# Patient Record
Sex: Male | Born: 1970 | Race: White | Hispanic: No | Marital: Married | Smoking: Former smoker
Health system: Southern US, Community
[De-identification: ages and names within clinical notes are randomized; demographics above are authoritative.]

## PROBLEM LIST (undated history)

## (undated) DIAGNOSIS — K5792 Diverticulitis of intestine, part unspecified, without perforation or abscess without bleeding: Secondary | ICD-10-CM

## (undated) DIAGNOSIS — K625 Hemorrhage of anus and rectum: Secondary | ICD-10-CM

## (undated) DIAGNOSIS — K589 Irritable bowel syndrome without diarrhea: Secondary | ICD-10-CM

## (undated) DIAGNOSIS — J45909 Unspecified asthma, uncomplicated: Secondary | ICD-10-CM

## (undated) DIAGNOSIS — Z973 Presence of spectacles and contact lenses: Secondary | ICD-10-CM

## (undated) HISTORY — PX: COLONOSCOPY: SHX174

## (undated) HISTORY — PX: OTHER SURGICAL HISTORY: SHX169

## (undated) HISTORY — DX: Diverticulitis of intestine, part unspecified, without perforation or abscess without bleeding: K57.92

## (undated) HISTORY — DX: Unspecified asthma, uncomplicated: J45.909

---

## 1991-07-07 HISTORY — PX: KNEE SURGERY: SHX244

## 1997-07-06 HISTORY — PX: APPENDECTOMY: SHX54

## 2013-02-27 ENCOUNTER — Telehealth (INDEPENDENT_AMBULATORY_CARE_PROVIDER_SITE_OTHER): Payer: Self-pay | Admitting: General Surgery

## 2013-02-28 ENCOUNTER — Ambulatory Visit (INDEPENDENT_AMBULATORY_CARE_PROVIDER_SITE_OTHER): Payer: Self-pay | Admitting: General Surgery

## 2013-03-03 ENCOUNTER — Ambulatory Visit (INDEPENDENT_AMBULATORY_CARE_PROVIDER_SITE_OTHER): Payer: 59 | Admitting: General Surgery

## 2013-03-03 ENCOUNTER — Encounter (INDEPENDENT_AMBULATORY_CARE_PROVIDER_SITE_OTHER): Payer: Self-pay | Admitting: General Surgery

## 2013-03-03 VITALS — BP 122/86 | HR 66 | Temp 97.1°F | Resp 16 | Ht 66.0 in | Wt 224.2 lb

## 2013-03-03 DIAGNOSIS — K611 Rectal abscess: Secondary | ICD-10-CM

## 2013-03-03 DIAGNOSIS — K612 Anorectal abscess: Secondary | ICD-10-CM

## 2013-03-03 MED ORDER — LEVOFLOXACIN 750 MG PO TABS: 750.0000 mg | ORAL_TABLET | Freq: Every day | ORAL | Status: AC

## 2013-03-03 NOTE — Progress Notes (Signed)
Patient ID: Jacob Graham, male   DOB: May 19, 1971, 42 y.o.   MRN: [id number]  Chief Complaint  Patient presents with  . New Evaluation    eval peri rectal abs  . Rectal Problems    HPI Jacob Graham is a 42 y.o. male.  This patient is referred by Dr. Maryan Rued for evaluation of a perirectal abscess. He said that since Christmas he has had a "fissure" which he self treated with verbal suppositories.  He has had bright red blood per rectum and pain with bowel movements but this improved with his herbal suppositories. This then was followed up with hemorrhoids and some thin ribbon like stools and he was referred for colonoscopy in May and was essentially normal except for a polyp. Since then he has been taking Metamucil daily and he moves his bowels frequently about 3 times per day.  Since starting the fiber is symptoms improved but about 2 weeks ago he stopped his fiber on vacation and this caused some straining with bowel movements and after that he felt a "knot" in the rectal area of. He has been having some purulent drainage initially but now the drainage is more clear. He has minimal discomfort although it did hurt more initially and he denies any fevers or chills. HPI  Past Medical History  Diagnosis Date  . Asthma     Past Surgical History  Procedure Laterality Date  . Appendectomy  1999  . Knee surgery  1993  . Colonoscopy  2008, 2014    Family History  Problem Relation Age of Onset  . Cancer Paternal Grandfather     Colon    Social History History  Substance Use Topics  . Smoking status: Former Smoker    Quit date: 03/03/2005  . Smokeless tobacco: Not on file  . Alcohol Use: Yes     Comment: 2x month    No Known Allergies  Current Outpatient Prescriptions  Medication Sig Dispense Refill  . psyllium (METAMUCIL) 58.6 % powder Take 1 packet by mouth Nightly.      Marland Kitchen Specialty Vitamins Products (MAGNESIUM, AMINO ACID CHELATE,) 133 MG tablet Take 1 tablet by mouth 2  (two) times daily.       No current facility-administered medications for this visit.    Review of Systems Review of Systems All other review of systems negative or noncontributory except as stated in the HPI  Blood pressure 122/86, pulse 66, temperature 97.1 F (36.2 C), temperature source Temporal, resp. rate 16, height 5\' 6"  (1.676 m), weight 224 lb 3.2 oz (101.696 kg), SpO2 98.00%.  Physical Exam Physical Exam Physical Exam  Vitals reviewed. Constitutional: He is oriented to person, place, and time. He appears well-developed and well-nourished. No distress.  HENT:  Head: Normocephalic and atraumatic.  Mouth/Throat: No oropharyngeal exudate.  Eyes: Conjunctivae and EOM are normal. Pupils are equal, round, and reactive to light. Right eye exhibits no discharge. Left eye exhibits no discharge. No scleral icterus.  Neck: Normal range of motion. No tracheal deviation present.  Cardiovascular: Normal rate, regular rhythm and normal heart sounds.   Pulmonary/Chest: Effort normal and breath sounds normal. No stridor. No respiratory distress. He has no wheezes. He has no rales. He exhibits no tenderness.  Abdominal: Soft. Bowel sounds are normal. He exhibits no distension and no mass. There is no tenderness. There is no rebound and no guarding.  Rectal:  He has minimal tenderness to digital rectal exam. 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 He does have a palpable "knot" immediately posteriorly but no significant tenderness or evidence of fistula. He also has a hemorrhoid at the left lateral column. Anoscopy was performed which did allow me to see if an internal opening with a small amount of purulent drainage in the posterior position in the area associated with the palpable induration externally. Musculoskeletal: Normal range of motion. He exhibits no edema and no tenderness.  Neurological: He is alert and oriented to person, place, and time.   Skin: Skin is warm and dry. No rash noted. He is not diaphoretic. No erythema. No pallor.  Psychiatric: He has a normal mood and affect. His behavior is normal. Judgment and thought content normal.    Data Reviewed   Assessment    Perirectal abscess I think his symptoms are most likely due to perirectal abscess. Fortunately, I think that this is already draining internally and hopefully this will be adequate drainage and we will not be need to do any formal incision and drainage. He still has some induration in the area although he says that this is much improved from prior. I will add some antibiotic therapy for the next 10 days and I will see him back in about 2 weeks. If his symptoms are improved then we will continue with watchful waiting otherwise I would likely recommend setting him up for formal rectal exam under anesthesia with possible internal drainage. I did recommend that he continue with his high-fiber diet and drink any fluids and again, I will see him back in in 2 weeks    Plan    Levaquin 750 mg daily Continue with high-fiber diet and I will see him back in 2 weeks for repeat exam         Laquisha Northcraft DAVID 03/03/2013, 10:50 AM

## 2013-03-07 NOTE — Telephone Encounter (Signed)
See message.

## 2013-03-17 ENCOUNTER — Ambulatory Visit (INDEPENDENT_AMBULATORY_CARE_PROVIDER_SITE_OTHER): Payer: 59 | Admitting: General Surgery

## 2013-03-17 ENCOUNTER — Encounter (INDEPENDENT_AMBULATORY_CARE_PROVIDER_SITE_OTHER): Payer: Self-pay | Admitting: General Surgery

## 2013-03-17 VITALS — BP 118/78 | HR 68 | Temp 97.2°F | Resp 16 | Ht 66.0 in | Wt 229.2 lb

## 2013-03-17 DIAGNOSIS — K602 Anal fissure, unspecified: Secondary | ICD-10-CM

## 2013-03-17 DIAGNOSIS — K603 Anal fistula: Secondary | ICD-10-CM

## 2013-03-17 NOTE — Progress Notes (Signed)
Subjective:     Patient ID: Jacob Graham, male   DOB: 1971/04/27, 42 y.o.   MRN: [id number]  HPI This patient follows up for evaluation of anal fissure and possible anal fistula. Last time I saw him we started him on some antibiotics and he said his symptoms had improved until about 5 days ago when he began having some swelling in the area and some discomfort and he had some spontaneous drainage from his rectum. He denies any pain with bowel movements or blood in the stools  Review of Systems     Objective:   Physical Exam On exam he has 8 left lateral column hemorrhoid without evidence of bleeding or thrombosis. Immediately in the posterior midline he has what appears to be an anal fissure without evidence of bleeding and is nontender. 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 He may have an associated small internal abscess or fistula but I do not see any external opening for this fistula if present. I also asked Dr. Ezzard Standing to examine him and we feel that at this point there is no obvious fistula or abscess and we feel that it would probably be best to does give [place] a bit more time to see if his symptoms improve spontaneously since he really isn't having much symptoms. We also discussed rectal exam under anesthesia to more clearly identify the cause but we will hold on this unless his symptoms do not completely resolve.     Plan:      If he continues to have issues then we will plan for rectal EUA with possible I/D or fistulotomy and anal fissurectomy

## 2015-03-25 ENCOUNTER — Emergency Department (HOSPITAL_BASED_OUTPATIENT_CLINIC_OR_DEPARTMENT_OTHER)
Admission: EM | Admit: 2015-03-25 | Discharge: 2015-03-25 | Disposition: A | Discharge status: Home / Self Care | Payer: Managed Care, Other (non HMO) | Attending: Emergency Medicine | Admitting: Emergency Medicine

## 2015-03-25 ENCOUNTER — Encounter (HOSPITAL_BASED_OUTPATIENT_CLINIC_OR_DEPARTMENT_OTHER): Payer: Self-pay | Admitting: *Deleted

## 2015-03-25 DIAGNOSIS — K589 Irritable bowel syndrome without diarrhea: Secondary | ICD-10-CM | POA: Insufficient documentation

## 2015-03-25 DIAGNOSIS — Z79899 Other long term (current) drug therapy: Secondary | ICD-10-CM | POA: Insufficient documentation

## 2015-03-25 DIAGNOSIS — R197 Diarrhea, unspecified: Secondary | ICD-10-CM | POA: Diagnosis not present

## 2015-03-25 DIAGNOSIS — J45909 Unspecified asthma, uncomplicated: Secondary | ICD-10-CM | POA: Insufficient documentation

## 2015-03-25 DIAGNOSIS — R109 Unspecified abdominal pain: Secondary | ICD-10-CM | POA: Diagnosis not present

## 2015-03-25 DIAGNOSIS — R110 Nausea: Secondary | ICD-10-CM | POA: Diagnosis not present

## 2015-03-25 DIAGNOSIS — M549 Dorsalgia, unspecified: Secondary | ICD-10-CM | POA: Insufficient documentation

## 2015-03-25 DIAGNOSIS — Z87891 Personal history of nicotine dependence: Secondary | ICD-10-CM | POA: Insufficient documentation

## 2015-03-25 DIAGNOSIS — Z9089 Acquired absence of other organs: Secondary | ICD-10-CM | POA: Insufficient documentation

## 2015-03-25 HISTORY — DX: Irritable bowel syndrome, unspecified: K58.9

## 2015-03-25 LAB — URINALYSIS, ROUTINE W REFLEX MICROSCOPIC
Bilirubin Urine: NEGATIVE
Glucose, UA: NEGATIVE mg/dL
Hgb urine dipstick: NEGATIVE
Ketones, ur: NEGATIVE mg/dL
Leukocytes, UA: NEGATIVE
Nitrite: NEGATIVE
Protein, ur: NEGATIVE mg/dL
Specific Gravity, Urine: 1.006 (ref 1.005–1.030)
Urobilinogen, UA: 0.2 mg/dL (ref 0.0–1.0)
pH: 6 (ref 5.0–8.0)

## 2015-03-25 NOTE — ED Notes (Signed)
Pt c/o left flank pain radiating around to his left lower abd x5 days. Pt also c/o nausea and diarrhea.

## 2015-03-25 NOTE — Discharge Instructions (Signed)
Flank Pain °Flank pain refers to pain that is located on the side of the body between the upper abdomen and the back. The pain may occur over a short period of time (acute) or may be long-term or reoccurring (chronic). It may be mild or severe. Flank pain can be caused by many things. °CAUSES  °Some of the more common causes of flank pain include: °· Muscle strains.   °· Muscle spasms.   °· A disease of your spine (vertebral disk disease).   °· A lung infection (pneumonia).   °· Fluid around your lungs (pulmonary edema).   °· A kidney infection.   °· Kidney stones.   °· A very painful skin rash caused by the chickenpox virus (shingles).   °· Gallbladder disease.   °HOME CARE INSTRUCTIONS  °Home care will depend on the cause of your pain. In general, °· Rest as directed by your caregiver. °· Drink enough fluids to keep your urine clear or pale yellow. °· Only take over-the-counter or prescription medicines as directed by your caregiver. Some medicines may help relieve the pain. °· Tell your caregiver about any changes in your pain. °· Follow up with your caregiver as directed. °SEEK IMMEDIATE MEDICAL CARE IF:  °· Your pain is not controlled with medicine.   °· You have new or worsening symptoms. °· Your pain increases.   °· You have abdominal pain.   °· You have shortness of breath.   °· You have persistent nausea or vomiting.   °· You have swelling in your abdomen.   °· You feel faint or pass out.   °· You have blood in your urine. °· You have a fever or persistent symptoms for more than 2-3 days. °· You have a fever and your symptoms suddenly get worse. °MAKE SURE YOU:  °· Understand these instructions. °· Will watch your condition. °· Will get help right away if you are not doing well or get worse. °Document Released: 08/13/2005 Document Revised: 03/16/2012 Document Reviewed: 02/04/2012 °Exit[employer]® Patient Information ©2015 [employer]. This information is not intended to replace advice given to you by your  health care provider. Make sure you discuss any questions you have with your health care provider. ° °

## 2015-03-25 NOTE — ED Provider Notes (Signed)
CSN: [id number]     Arrival date & time 03/25/15  1003 History   First MD Initiated Contact with Patient 03/25/15 1020     Chief Complaint  Patient presents with  . Flank Pain     (Consider location/radiation/quality/duration/timing/severity/associated sxs/prior Treatment) Patient is a 44 y.o. male presenting with flank pain. The history is provided by the patient.  Flank Pain This is a new problem. Pertinent negatives include no chest pain, no abdominal pain and no shortness of breath.   patient has had some pain in his left flank for the last few days. Worse with certain movements. Worse with lying down. No dysuria. He has had some diarrhea and nausea. States he has had some rumbling in his abdomen also. States he was talking his wife and they're worried about infection or kidney stone. The pain does come and go somewhat. No fevers or chills. States the pain can get somewhat severe time but also comes and goes. No history of kidney stones. No dysuria or hematuria.   Past Medical History  Diagnosis Date  . Asthma   . IBS (irritable bowel syndrome)    Past Surgical History  Procedure Laterality Date  . Appendectomy  1999  . Knee surgery  1993  . Colonoscopy  2008, 2014   Family History  Problem Relation Age of Onset  . Cancer Paternal Grandfather     Colon   Social History  Substance Use Topics  . Smoking status: Former Smoker    Quit date: 03/03/2005  . Smokeless tobacco: None  . Alcohol Use: Yes     Comment: 2x month    Review of Systems  Constitutional: Negative for appetite change.  Respiratory: Negative for shortness of breath.   Cardiovascular: Negative for chest pain.  Gastrointestinal: Positive for nausea and diarrhea. Negative for abdominal pain.  Genitourinary: Positive for flank pain. Negative for hematuria.  Musculoskeletal: Positive for back pain.      Allergies  Review of patient's allergies indicates no known allergies.  Home Medications   Prior  to Admission medications   Medication Sig Start Date End Date Taking? Authorizing Provider  psyllium (METAMUCIL) 58.6 % powder Take 1 packet by mouth Nightly.    Historical Provider, MD  Specialty Vitamins Products (MAGNESIUM, AMINO ACID CHELATE,) 133 MG tablet Take 1 tablet by mouth 2 (two) times daily.    Historical Provider, MD   BP 142/89 mmHg  Pulse 72  Temp(Src) 98 F (36.7 C) (Oral)  Resp 16  Ht  (1.676 m)  Wt 230 lb (104.327 kg)  BMI 37.14 kg/m2  SpO2 100% Physical Exam  Constitutional: He appears well-developed and well-nourished.  Neck: Neck supple.  Cardiovascular: Normal rate.   Pulmonary/Chest: Effort normal.  Abdominal: There is no tenderness.  Musculoskeletal:  Mild tenderness and left CVA area. No rash.  Neurological: He is alert.  Skin: Skin is warm.    ED Course  Procedures (including critical care time) Labs Review Labs Reviewed  URINALYSIS, ROUTINE W REFLEX MICROSCOPIC (NOT AT [facility])    Imaging Review No results found. I have personally reviewed and evaluated these images and lab results as part of my medical decision-making.   EKG Interpretation None      MDM   Final diagnoses:  Flank pain   patient with left flank pain. Likely musculoskeletal. Clean urine. Overall rather benign exam. Will discharge home.    Benjiman Core, MD 03/25/15 1504

## 2015-09-24 ENCOUNTER — Other Ambulatory Visit: Payer: Self-pay | Admitting: General Surgery

## 2015-09-24 NOTE — H&P (Signed)
  History of Present Illness Jacob Graham(Jacob Manuelito MD; 09/24/2015 11:56 AM) The patient is a 45 year old male who presents with a complaint of anal problems. 45yo M with h/o anal fissure in 2014 who presents to the office with a new area of inflammation at posterior midline that has turned into a "knot" that sometimes swells up, becomes painful and then drains. This occurs every few weeks. He denies any bleeding. He takes metamucil daily and is regular with this. Recently he has had more drainage from his fistula site then normal. He has also had several episodes of bloody bowel movements. Patient does have a history of rectal bleeding and has had colonoscopies in the past which have been normal.   Problem List/Past Medical Jacob Graham(Jacob Courser, MD; 09/24/2015 11:56 AM) ANAL FISTULA (K60.3)  Other Problems Jacob Graham(Jacob Tetreault, MD; 09/24/2015 11:56 AM) Other disease, cancer, significant illness Arthritis Asthma  Past Surgical History Jacob Graham(Jacob Bedoy, MD; 09/24/2015 11:56 AM) Colon Polyp Removal - Colonoscopy Appendectomy  Diagnostic Studies History Jacob Graham(Jacob Finstad, MD; 09/24/2015 11:56 AM) Colonoscopy 1-5 years ago  Allergies Jacob Graham(Jacob Graham, CMA; 09/24/2015 11:12 AM) No Known Drug Allergies 07/25/2015  Medication History Jacob Graham(Jacob Fiumara, MD; 09/24/2015 11:56 AM) Metamucil (58.6% Powder, Oral) Active. Medications Reconciled ValACYclovir HCl (1GM Tablet, Oral) Active.  Social History Jacob Graham(Jacob Kratky, MD; 09/24/2015 11:56 AM) Alcohol use Occasional alcohol use. Tobacco use Former smoker. Caffeine use Coffee, Tea. No drug use  Family History Jacob Graham(Jacob Asbury, MD; 09/24/2015 11:56 AM) Alcohol Abuse Father, Mother. Thyroid problems Mother. Seizure disorder Mother. Hypertension Mother.    Vitals Jacob Graham(Jacob Graham CMA; 09/24/2015 11:13 AM) 09/24/2015 11:13 AM Weight: 237 lb Height: 66in Body Surface Area: 2.15 m Body Mass Index: 38.25 kg/m  Temp.: 97.32F  Pulse: 80 (Regular)  BP:  130/68 (Sitting, Left Arm, Standard)      Physical Exam Jacob Graham(Judith Campillo MD; 09/24/2015 11:56 AM)  General Mental Status-Alert. General Appearance-Not in acute distress. Build & Nutrition-Well nourished. Posture-Normal posture. Gait-Normal.  Head and Neck Head-normocephalic, atraumatic with no lesions or palpable masses. Trachea-midline.  Chest and Lung Exam Chest and lung exam reveals -on auscultation, normal breath sounds, no adventitious sounds and normal vocal resonance.  Cardiovascular Cardiovascular examination reveals -normal heart sounds, regular rate and rhythm with no murmurs.  Abdomen Inspection Inspection of the abdomen reveals - No Hernias. Palpation/Percussion Palpation and Percussion of the abdomen reveal - Soft, Non Tender, No Rigidity (guarding), No hepatosplenomegaly and No Palpable abdominal masses.  Rectal Anorectal Exam External - Note: posterior midline external opening.  Neurologic Neurologic evaluation reveals -alert and oriented x 3 with no impairment of recent or remote memory, normal attention span and ability to concentrate, normal sensation and normal coordination.  Musculoskeletal Normal Exam - Bilateral-Upper Extremity Strength Normal and Lower Extremity Strength Normal.    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 He is now having some rectal bleeding as well. He has noticed a change in the caliber of his stool. I have recommended an exam under anesthesia with fistulotomy versus seton placement. If an internal hemorrhoid appears to be the cause of his bleeding, we will plan on doing a hemorrhoidal pexy at the same time. Risks include bleeding, infection and a small chance of incontinence if fistulotomy is performed.

## 2015-10-08 ENCOUNTER — Encounter (HOSPITAL_BASED_OUTPATIENT_CLINIC_OR_DEPARTMENT_OTHER): Payer: Self-pay | Admitting: *Deleted

## 2015-10-08 NOTE — Progress Notes (Signed)
Pt instructed npo pmn tonight.  [facility]  Needs hgb on arrival.  Pt to use inhaler and bring it with him.

## 2015-10-09 ENCOUNTER — Encounter (HOSPITAL_BASED_OUTPATIENT_CLINIC_OR_DEPARTMENT_OTHER): Payer: Self-pay | Admitting: *Deleted

## 2015-10-09 ENCOUNTER — Encounter (HOSPITAL_BASED_OUTPATIENT_CLINIC_OR_DEPARTMENT_OTHER)
Admission: RE | Disposition: A | Discharge status: Home / Self Care | Payer: Self-pay | Source: Ambulatory Visit | Attending: General Surgery

## 2015-10-09 ENCOUNTER — Ambulatory Visit (HOSPITAL_BASED_OUTPATIENT_CLINIC_OR_DEPARTMENT_OTHER): Payer: Managed Care, Other (non HMO) | Admitting: Anesthesiology

## 2015-10-09 ENCOUNTER — Ambulatory Visit (HOSPITAL_BASED_OUTPATIENT_CLINIC_OR_DEPARTMENT_OTHER)
Admission: RE | Admit: 2015-10-09 | Discharge: 2015-10-09 | Disposition: A | Discharge status: Home / Self Care | Payer: Managed Care, Other (non HMO) | Source: Ambulatory Visit | Attending: General Surgery | Admitting: General Surgery

## 2015-10-09 DIAGNOSIS — K648 Other hemorrhoids: Secondary | ICD-10-CM | POA: Insufficient documentation

## 2015-10-09 DIAGNOSIS — Z79899 Other long term (current) drug therapy: Secondary | ICD-10-CM | POA: Diagnosis not present

## 2015-10-09 DIAGNOSIS — Z87891 Personal history of nicotine dependence: Secondary | ICD-10-CM | POA: Diagnosis not present

## 2015-10-09 DIAGNOSIS — K603 Anal fistula: Secondary | ICD-10-CM | POA: Insufficient documentation

## 2015-10-09 HISTORY — DX: Presence of spectacles and contact lenses: Z97.3

## 2015-10-09 HISTORY — DX: Hemorrhage of anus and rectum: K62.5

## 2015-10-09 HISTORY — PX: EVALUATION UNDER ANESTHESIA WITH FISTULECTOMY: SHX5623

## 2015-10-09 SURGERY — EXAM UNDER ANESTHESIA WITH FISTULECTOMY
Anesthesia: Monitor Anesthesia Care

## 2015-10-09 MED ORDER — ONDANSETRON HCL 4 MG/2ML IJ SOLN
4.0000 mg | Freq: Once | INTRAMUSCULAR | Status: AC | PRN
  Administered 2015-10-09: 4 mg via INTRAVENOUS
  Filled 2015-10-09: qty 2

## 2015-10-09 MED ORDER — KETOROLAC TROMETHAMINE 30 MG/ML IJ SOLN
INTRAMUSCULAR | Status: DC | PRN
  Administered 2015-10-09: 30 mg via INTRAVENOUS

## 2015-10-09 MED ORDER — ACETAMINOPHEN 650 MG RE SUPP
650.0000 mg | RECTAL | Status: DC | PRN
  Filled 2015-10-09: qty 1

## 2015-10-09 MED ORDER — HYDROMORPHONE HCL 1 MG/ML IJ SOLN
0.5000 mg | INTRAMUSCULAR | Status: DC | PRN
  Filled 2015-10-09: qty 1

## 2015-10-09 MED ORDER — FENTANYL CITRATE (PF) 100 MCG/2ML IJ SOLN
INTRAMUSCULAR | Status: DC | PRN
  Administered 2015-10-09 (×8): 12.5 ug via INTRAVENOUS

## 2015-10-09 MED ORDER — OXYCODONE HCL 5 MG PO TABS: 5.0000 mg | ORAL_TABLET | ORAL | Status: DC | PRN

## 2015-10-09 MED ORDER — BUPIVACAINE-EPINEPHRINE 0.5% -1:200000 IJ SOLN
INTRAMUSCULAR | Status: DC | PRN
  Administered 2015-10-09: 10 mL
  Administered 2015-10-09: 7 mL

## 2015-10-09 MED ORDER — ACETAMINOPHEN 325 MG PO TABS
650.0000 mg | ORAL_TABLET | ORAL | Status: DC | PRN
  Filled 2015-10-09: qty 2

## 2015-10-09 MED ORDER — SODIUM CHLORIDE 0.9 % IV SOLN
250.0000 mL | INTRAVENOUS | Status: DC | PRN
  Filled 2015-10-09: qty 250

## 2015-10-09 MED ORDER — LIDOCAINE 5 % EX OINT
TOPICAL_OINTMENT | CUTANEOUS | Status: DC | PRN
  Administered 2015-10-09: 1

## 2015-10-09 MED ORDER — OXYCODONE HCL 5 MG PO TABS
5.0000 mg | ORAL_TABLET | ORAL | Status: DC | PRN
Start: 2015-10-09 — End: 2015-10-09
  Filled 2015-10-09: qty 2

## 2015-10-09 MED ORDER — LIDOCAINE HCL (CARDIAC) 20 MG/ML IV SOLN
INTRAVENOUS | Status: DC | PRN
  Administered 2015-10-09: 100 mg via INTRAVENOUS

## 2015-10-09 MED ORDER — DEXAMETHASONE SODIUM PHOSPHATE 4 MG/ML IJ SOLN
INTRAMUSCULAR | Status: DC | PRN
  Administered 2015-10-09: 10 mg via INTRAVENOUS

## 2015-10-09 MED ORDER — PROPOFOL 500 MG/50ML IV EMUL
INTRAVENOUS | Status: AC
  Filled 2015-10-09: qty 50

## 2015-10-09 MED ORDER — LIDOCAINE HCL (CARDIAC) 20 MG/ML IV SOLN
INTRAVENOUS | Status: AC
  Filled 2015-10-09: qty 5

## 2015-10-09 MED ORDER — LACTATED RINGERS IV SOLN
INTRAVENOUS | Status: DC
Start: 2015-10-09 — End: 2015-10-09
  Administered 2015-10-09: 10:00:00 via INTRAVENOUS
  Filled 2015-10-09: qty 1000

## 2015-10-09 MED ORDER — PROPOFOL 500 MG/50ML IV EMUL
INTRAVENOUS | Status: DC | PRN
  Administered 2015-10-09: 25 ug/kg/min via INTRAVENOUS

## 2015-10-09 MED ORDER — MIDAZOLAM HCL 5 MG/5ML IJ SOLN
INTRAMUSCULAR | Status: DC | PRN
  Administered 2015-10-09: 2 mg via INTRAVENOUS

## 2015-10-09 MED ORDER — SODIUM CHLORIDE 0.9% FLUSH
3.0000 mL | INTRAVENOUS | Status: DC | PRN
  Filled 2015-10-09: qty 3

## 2015-10-09 MED ORDER — FENTANYL CITRATE (PF) 100 MCG/2ML IJ SOLN
INTRAMUSCULAR | Status: AC
  Filled 2015-10-09: qty 2

## 2015-10-09 MED ORDER — SODIUM CHLORIDE 0.9% FLUSH
3.0000 mL | Freq: Two times a day (BID) | INTRAVENOUS | Status: DC
  Filled 2015-10-09: qty 3

## 2015-10-09 MED ORDER — MIDAZOLAM HCL 2 MG/2ML IJ SOLN
INTRAMUSCULAR | Status: AC
  Filled 2015-10-09: qty 2

## 2015-10-09 MED ORDER — SODIUM CHLORIDE 0.9 % IR SOLN
Status: DC | PRN
  Administered 2015-10-09: 500 mL

## 2015-10-09 SURGICAL SUPPLY — 40 items
BENZOIN TINCTURE PRP APPL 2/3 (GAUZE/BANDAGES/DRESSINGS) ×2 IMPLANT
BLADE SURG 10 STRL SS (BLADE) IMPLANT
BRIEF STRETCH FOR OB PAD LRG (UNDERPADS AND DIAPERS) ×2 IMPLANT
COVER BACK TABLE 60X90IN (DRAPES) ×2 IMPLANT
COVER MAYO STAND STRL (DRAPES) ×2 IMPLANT
DECANTER SPIKE VIAL GLASS SM (MISCELLANEOUS) IMPLANT
DRAPE LAPAROTOMY 100X72 PEDS (DRAPES) ×2 IMPLANT
DRAPE UTILITY XL STRL (DRAPES) ×2 IMPLANT
ELECT REM PT RETURN 9FT ADLT (ELECTROSURGICAL) ×2
ELECTRODE REM PT RTRN 9FT ADLT (ELECTROSURGICAL) ×1 IMPLANT
GAUZE SPONGE 4X4 16PLY XRAY LF (GAUZE/BANDAGES/DRESSINGS) ×2 IMPLANT
GLOVE BIO SURGEON STRL SZ 6.5 (GLOVE) ×2 IMPLANT
GLOVE INDICATOR 7.0 STRL GRN (GLOVE) ×2 IMPLANT
GOWN STRL REUS W/ TWL LRG LVL3 (GOWN DISPOSABLE) ×1 IMPLANT
GOWN STRL REUS W/TWL 2XL LVL3 (GOWN DISPOSABLE) ×4 IMPLANT
GOWN STRL REUS W/TWL LRG LVL3 (GOWN DISPOSABLE) ×1
HYDROGEN PEROXIDE 16OZ (MISCELLANEOUS) IMPLANT
KIT ROOM TURNOVER WOR (KITS) ×2 IMPLANT
NEEDLE HYPO 22GX1.5 SAFETY (NEEDLE) ×2 IMPLANT
NS IRRIG 500ML POUR BTL (IV SOLUTION) ×2 IMPLANT
PACK BASIN DAY SURGERY FS (CUSTOM PROCEDURE TRAY) ×2 IMPLANT
PAD ABD 8X10 STRL (GAUZE/BANDAGES/DRESSINGS) IMPLANT
PAD ARMBOARD 7.5X6 YLW CONV (MISCELLANEOUS) ×2 IMPLANT
PENCIL BUTTON HOLSTER BLD 10FT (ELECTRODE) ×2 IMPLANT
SPONGE GAUZE 4X4 12PLY STER LF (GAUZE/BANDAGES/DRESSINGS) ×2 IMPLANT
SPONGE SURGIFOAM ABS GEL 100 (HEMOSTASIS) IMPLANT
SPONGE SURGIFOAM ABS GEL 12-7 (HEMOSTASIS) IMPLANT
STAPLER PROXIMATE HCS (STAPLE) IMPLANT
STAPLER VISISTAT 35W (STAPLE) ×2 IMPLANT
SUT CHROMIC 2 0 SH (SUTURE) ×2 IMPLANT
SUT GUT CHROMIC 3 0 (SUTURE) IMPLANT
SUT PROLENE 2 0 BLUE (SUTURE) IMPLANT
SUT VIC AB 4-0 P-3 18XBRD (SUTURE) IMPLANT
SUT VIC AB 4-0 P3 18 (SUTURE)
SUT VIC AB 4-0 SH 18 (SUTURE) IMPLANT
SYR CONTROL 10ML LL (SYRINGE) ×2 IMPLANT
TRAY DSU PREP LF (CUSTOM PROCEDURE TRAY) ×2 IMPLANT
TUBE CONNECTING 12X1/4 (SUCTIONS) ×2 IMPLANT
WATER STERILE IRR 500ML POUR (IV SOLUTION) IMPLANT
YANKAUER SUCT BULB TIP NO VENT (SUCTIONS) ×2 IMPLANT

## 2015-10-09 NOTE — Anesthesia Procedure Notes (Signed)
Procedure Name: MAC Date/Time: 10/09/2015 11:36 AM Performed by: Jessica PriestBEESON, Arseniy Toomey C Pre-anesthesia Checklist: Patient identified, Emergency Drugs available, Suction available, Patient being monitored and Timeout performed Patient Re-evaluated:Patient Re-evaluated prior to inductionOxygen Delivery Method: Nasal cannula Preoxygenation: Pre-oxygenation with 100% oxygen Placement Confirmation: positive ETCO2 and breath sounds checked- equal and bilateral

## 2015-10-09 NOTE — H&P (View-Only) (Signed)
  History of Present Illness Romie Levee(Jaylani Mcguinn MD; 09/24/2015 11:56 AM) The patient is a 45 year old male who presents with a complaint of anal problems. 45yo M with h/o anal fissure in 2014 who presents to the office with a new area of inflammation at posterior midline that has turned into a "knot" that sometimes swells up, becomes painful and then drains. This occurs every few weeks. He denies any bleeding. He takes metamucil daily and is regular with this. Recently he has had more drainage from his fistula site then normal. He has also had several episodes of bloody bowel movements. Patient does have a history of rectal bleeding and has had colonoscopies in the past which have been normal.   Problem List/Past Medical Romie Levee(Doratha Mcswain, MD; 09/24/2015 11:56 AM) ANAL FISTULA (K60.3)  Other Problems Romie Levee(Connar Keating, MD; 09/24/2015 11:56 AM) Other disease, cancer, significant illness Arthritis Asthma  Past Surgical History Romie Levee(Hendrixx Severin, MD; 09/24/2015 11:56 AM) Colon Polyp Removal - Colonoscopy Appendectomy  Diagnostic Studies History Romie Levee(Neshawn Aird, MD; 09/24/2015 11:56 AM) Colonoscopy 1-5 years ago  Allergies Fay Records(Ashley Beck, CMA; 09/24/2015 11:12 AM) No Known Drug Allergies 07/25/2015  Medication History Romie Levee(Sharnette Kitamura, MD; 09/24/2015 11:56 AM) Metamucil (58.6% Powder, Oral) Active. Medications Reconciled ValACYclovir HCl (1GM Tablet, Oral) Active.  Social History Romie Levee(Paxtyn Wisdom, MD; 09/24/2015 11:56 AM) Alcohol use Occasional alcohol use. Tobacco use Former smoker. Caffeine use Coffee, Tea. No drug use  Family History Romie Levee(Selah Klang, MD; 09/24/2015 11:56 AM) Alcohol Abuse Father, Mother. Thyroid problems Mother. Seizure disorder Mother. Hypertension Mother.    Vitals Fay Records(Ashley Beck CMA; 09/24/2015 11:13 AM) 09/24/2015 11:13 AM Weight: 237 lb Height: 66in Body Surface Area: 2.15 m Body Mass Index: 38.25 kg/m  Temp.: 97.50F  Pulse: 80 (Regular)  BP:  130/68 (Sitting, Left Arm, Standard)      Physical Exam Romie Levee(Damian Buckles MD; 09/24/2015 11:56 AM)  General Mental Status-Alert. General Appearance-Not in acute distress. Build & Nutrition-Well nourished. Posture-Normal posture. Gait-Normal.  Head and Neck Head-normocephalic, atraumatic with no lesions or palpable masses. Trachea-midline.  Chest and Lung Exam Chest and lung exam reveals -on auscultation, normal breath sounds, no adventitious sounds and normal vocal resonance.  Cardiovascular Cardiovascular examination reveals -normal heart sounds, regular rate and rhythm with no murmurs.  Abdomen Inspection Inspection of the abdomen reveals - No Hernias. Palpation/Percussion Palpation and Percussion of the abdomen reveal - Soft, Non Tender, No Rigidity (guarding), No hepatosplenomegaly and No Palpable abdominal masses.  Rectal Anorectal Exam External - Note: posterior midline external opening.  Neurologic Neurologic evaluation reveals -alert and oriented x 3 with no impairment of recent or remote memory, normal attention span and ability to concentrate, normal sensation and normal coordination.  Musculoskeletal Normal Exam - Bilateral-Upper Extremity Strength Normal and Lower Extremity Strength Normal.    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 He is now having some rectal bleeding as well. He has noticed a change in the caliber of his stool. I have recommended an exam under anesthesia with fistulotomy versus seton placement. If an internal hemorrhoid appears to be the cause of his bleeding, we will plan on doing a hemorrhoidal pexy at the same time. Risks include bleeding, infection and a small chance of incontinence if fistulotomy is performed.

## 2015-10-09 NOTE — Anesthesia Postprocedure Evaluation (Signed)
Anesthesia Post Note  Patient: Emi HolesMichael F Monrreal  Procedure(s) Performed: Procedure(s) (LRB): EXAM UNDER ANESTHESIA; FISTULOTOMY (N/A)  Patient location during evaluation: PACU Anesthesia Type: General Level of consciousness: awake and alert Pain management: pain level controlled Vital Signs Assessment: post-procedure vital signs reviewed and stable Respiratory status: spontaneous breathing, nonlabored ventilation, respiratory function stable and patient connected to nasal cannula oxygen Cardiovascular status: blood pressure returned to baseline and stable Postop Assessment: no signs of nausea or vomiting Anesthetic complications: no    Last Vitals:  Filed Vitals:   10/09/15 1230 10/09/15 1245  BP: 111/66 121/68  Pulse: 83 78  Temp:    Resp: 14 13    Last Pain:  Filed Vitals:   10/09/15 1249  PainSc: 0-No pain                 Sebastian Acheheodore Caden Fatica

## 2015-10-09 NOTE — Interval H&P Note (Signed)
History and Physical Interval Note:  10/09/2015 10:19 AM  Emi HolesMichael F Cangemi  has presented today for surgery, with the diagnosis of Anal fistula and bleeding   The various methods of treatment have been discussed with the patient and family. After consideration of risks, benefits and other options for treatment, the patient has consented to  Procedure(s): EXAM UNDER ANESTHESIA POSSIBLE SETON POSSIBLE  FISTULOTOMY POSSIBLE HEMORRHOIDAL PEXY  (N/A) as a surgical intervention .  The patient's history has been reviewed, patient examined, no change in status, stable for surgery.  I have reviewed the patient's chart and labs.  Questions were answered to the patient's satisfaction.     Vanita PandaAlicia C Donjuan Robison, MD  Colorectal and General Surgery [facility]

## 2015-10-09 NOTE — Op Note (Signed)
10/09/2015  12:06 PM  PATIENT:  Jacob Graham  45 y.o. male  Patient Care Team: Provider Not In System as PCP - General  PRE-OPERATIVE DIAGNOSIS:  Anal fistula and bleeding   POST-OPERATIVE DIAGNOSIS:  Anal fistula and bleeding   PROCEDURE:  EXAM UNDER ANESTHESIA; FISTULOTOMY   Surgeon(s): Romie LeveeAlicia Maclane Holloran, MD  ASSISTANT: none   ANESTHESIA:   local and MAC  SPECIMEN:  No Specimen  DISPOSITION OF SPECIMEN:  N/A  COUNTS:  YES  PLAN OF CARE: Discharge to home after PACU  PATIENT DISPOSITION:  PACU - hemodynamically stable.  INDICATION: 45 y.o. M with chronic anal pain, rectal bleeding and discharge   OR FINDINGS: Posterior anal fistula  DESCRIPTION: the patient was identified in the preoperative holding area and taken to the OR where they were laid on the operating room table.  MAC anesthesia was induced without difficulty. The patient was then positioned in prone jackknife position with buttocks gently taped apart.  The patient was then prepped and draped in usual sterile fashion.  SCDs were noted to be in place prior to the initiation of anesthesia. A surgical timeout was performed indicating the correct patient, procedure, positioning and need for preoperative antibiotics.  A rectal block was performed using Marcaine with epinephrine.    I began with a digital rectal exam.  There were no masses noted.  I then placed a Hill-Ferguson anoscope into the anal canal and evaluated this completely.  The patient had grade 2 internal hemorrhoids with no signs of active inflammation. There was obvious sphincter hypertension and an open area at posterior midline consistent with fissuring around the site of internal opening of the fistula. I placed a S-shaped fistula probe into the external opening and this easily traversed into the internal opening. There was some muscle involvement of both internal and external sphincters.  Given his sphincter hypertension and the fissure, I thought to be  wise to perform a fistulotomy. This was done the Bovie electrocautery. The edges were marsupialized using a running 2-0 chromic suture. Additional Marcaine was placed around fistulotomy site. Lidocaine ointment and a dressing were applied. The patient was awakened from anesthesia and sent to the postanesthesia care unit in stable condition. All counts were correct per operating room staff.

## 2015-10-09 NOTE — Transfer of Care (Signed)
Immediate Anesthesia Transfer of Care Note  Patient: Jacob Graham  Procedure(s) Performed: Procedure(s) (LRB): EXAM UNDER ANESTHESIA; FISTULOTOMY (N/A)  Patient Location: PACU  Anesthesia Type: MAC  Level of Consciousness: awake, sedated, patient cooperative and responds to stimulation  Airway & Oxygen Therapy: Patient Spontanous Breathing and Patient connected to face mask oxygen  Post-op Assessment: Report given to PACU RN, Post -op Vital signs reviewed and stable and Patient moving all extremities  Post vital signs: Reviewed and stable  Complications: No apparent anesthesia complications

## 2015-10-09 NOTE — Discharge Instructions (Addendum)

## 2015-10-09 NOTE — Anesthesia Preprocedure Evaluation (Addendum)
Anesthesia Evaluation  Patient identified by MRN, date of birth, ID band Patient awake    Reviewed: Allergy & Precautions, NPO status , Patient's Chart, lab work & pertinent test results  Airway Mallampati: II  TM Distance: >3 FB Neck ROM: Full    Dental   Pulmonary asthma , former smoker,    Pulmonary exam normal        Cardiovascular Normal cardiovascular exam     Neuro/Psych    GI/Hepatic   Endo/Other    Renal/GU      Musculoskeletal   Abdominal   Peds  Hematology   Anesthesia Other Findings   Reproductive/Obstetrics                            Anesthesia Physical Anesthesia Plan  ASA: II  Anesthesia Plan: MAC   Post-op Pain Management:    Induction: Intravenous  Airway Management Planned: Mask and Simple Face Mask  Additional Equipment:   Intra-op Plan:   Post-operative Plan:   Informed Consent: I have reviewed the patients History and Physical, chart, labs and discussed the procedure including the risks, benefits and alternatives for the proposed anesthesia with the patient or authorized representative who has indicated his/her understanding and acceptance.     Plan Discussed with: CRNA, Anesthesiologist and Surgeon  Anesthesia Plan Comments:        Anesthesia Quick Evaluation

## 2015-10-10 ENCOUNTER — Encounter (HOSPITAL_BASED_OUTPATIENT_CLINIC_OR_DEPARTMENT_OTHER): Payer: Self-pay | Admitting: General Surgery

## 2015-10-11 LAB — POCT HEMOGLOBIN-HEMACUE: Hemoglobin: 14.3 g/dL (ref 13.0–17.0)

## 2018-08-21 ENCOUNTER — Emergency Department (HOSPITAL_COMMUNITY): Payer: Commercial Managed Care - PPO

## 2018-08-21 ENCOUNTER — Emergency Department (HOSPITAL_COMMUNITY)
Admission: EM | Admit: 2018-08-21 | Discharge: 2018-08-21 | Disposition: A | Discharge status: Home / Self Care | Payer: Commercial Managed Care - PPO | Attending: Emergency Medicine | Admitting: Emergency Medicine

## 2018-08-21 ENCOUNTER — Encounter (HOSPITAL_COMMUNITY): Payer: Self-pay | Admitting: Emergency Medicine

## 2018-08-21 ENCOUNTER — Other Ambulatory Visit: Payer: Self-pay

## 2018-08-21 DIAGNOSIS — Z79899 Other long term (current) drug therapy: Secondary | ICD-10-CM | POA: Insufficient documentation

## 2018-08-21 DIAGNOSIS — Z87891 Personal history of nicotine dependence: Secondary | ICD-10-CM | POA: Diagnosis not present

## 2018-08-21 DIAGNOSIS — R0789 Other chest pain: Secondary | ICD-10-CM | POA: Diagnosis present

## 2018-08-21 DIAGNOSIS — J45909 Unspecified asthma, uncomplicated: Secondary | ICD-10-CM | POA: Insufficient documentation

## 2018-08-21 LAB — CBC
HCT: 44.9 % (ref 39.0–52.0)
Hemoglobin: 14.8 g/dL (ref 13.0–17.0)
MCH: 30.9 pg (ref 26.0–34.0)
MCHC: 33 g/dL (ref 30.0–36.0)
MCV: 93.7 fL (ref 80.0–100.0)
Platelets: 280 10*3/uL (ref 150–400)
RBC: 4.79 MIL/uL (ref 4.22–5.81)
RDW: 13.3 % (ref 11.5–15.5)
WBC: 8.7 10*3/uL (ref 4.0–10.5)
nRBC: 0 % (ref 0.0–0.2)

## 2018-08-21 LAB — BASIC METABOLIC PANEL
Anion gap: 9 (ref 5–15)
BUN: 18 mg/dL (ref 6–20)
CO2: 23 mmol/L (ref 22–32)
Calcium: 8.8 mg/dL — ABNORMAL LOW (ref 8.9–10.3)
Chloride: 106 mmol/L (ref 98–111)
Creatinine, Ser: 0.76 mg/dL (ref 0.61–1.24)
GFR calc Af Amer: >60 mL/min (ref >60)
GFR calc non Af Amer: >60 mL/min (ref >60)
Glucose, Bld: 100 mg/dL — ABNORMAL HIGH (ref 70–99)
Potassium: 3.9 mmol/L (ref 3.5–5.1)
Sodium: 138 mmol/L (ref 135–145)

## 2018-08-21 LAB — POCT I-STAT TROPONIN I
Troponin i, poc: 0 ng/mL (ref 0.00–0.08)
Troponin i, poc: 0 ng/mL (ref 0.00–0.08)

## 2018-08-21 LAB — POCT I-STAT CREATININE: Creatinine, Ser: 0.8 mg/dL (ref 0.61–1.24)

## 2018-08-21 MED ORDER — IOPAMIDOL (ISOVUE-370) INJECTION 76%
INTRAVENOUS | Status: AC
  Filled 2018-08-21: qty 100

## 2018-08-21 MED ORDER — SODIUM CHLORIDE 0.9% FLUSH
3.0000 mL | Freq: Once | INTRAVENOUS | Status: AC
  Administered 2018-08-21: 3 mL via INTRAVENOUS

## 2018-08-21 MED ORDER — IOPAMIDOL (ISOVUE-370) INJECTION 76%
100.0000 mL | Freq: Once | INTRAVENOUS | Status: AC | PRN
  Administered 2018-08-21: 100 mL via INTRAVENOUS

## 2018-08-21 MED ORDER — SODIUM CHLORIDE (PF) 0.9 % IJ SOLN
INTRAMUSCULAR | Status: AC
  Filled 2018-08-21: qty 50

## 2018-08-21 MED ORDER — MORPHINE SULFATE (PF) 4 MG/ML IV SOLN
4.0000 mg | Freq: Once | INTRAVENOUS | Status: AC
  Administered 2018-08-21: 4 mg via INTRAVENOUS
  Filled 2018-08-21: qty 1

## 2018-08-21 MED ORDER — KETOROLAC TROMETHAMINE 30 MG/ML IJ SOLN
15.0000 mg | Freq: Once | INTRAMUSCULAR | Status: AC
  Administered 2018-08-21: 15 mg via INTRAVENOUS
  Filled 2018-08-21: qty 1

## 2018-08-21 MED ORDER — ONDANSETRON HCL 4 MG/2ML IJ SOLN
4.0000 mg | Freq: Once | INTRAMUSCULAR | Status: AC
  Administered 2018-08-21: 4 mg via INTRAVENOUS
  Filled 2018-08-21: qty 2

## 2018-08-21 NOTE — ED Notes (Signed)
Pt d/c home per MD order. Discharge summary reviewed with pt, pt verbalizes understanding. Reports visitor is driving him home.

## 2018-08-21 NOTE — ED Notes (Signed)
EKG done at 0504

## 2018-08-21 NOTE — Discharge Instructions (Signed)
Return to the ED if you start to have worsening symptoms, increased chest pain, shortness of breath, leg swelling, vomiting or coughing up blood.

## 2018-08-21 NOTE — ED Provider Notes (Signed)
[facility]-EMERGENCY DEPT Provider Note   CSN: [id number] Arrival date & time: 08/21/18  0454     History   Chief Complaint Chief Complaint  Patient presents with  . Chest Pain    HPI Jacob Graham is a 48 y.o. male.  Patient presents to the emergency department with a chief complaint of left sided chest pain that radiates to his back.  It is worsened with breathing.  States that he had an episode of this yesterday, but then it improved and came back significantly worse tonight.  He rates pain as severe.  He has not taken anything for the symptoms.  Denies any lightheadedness or dizziness.  He denies shortness of breath, but states that the pain is worsened with deep breathing.  Denies any cough or fever.  It is worsened when he twists or lies down.  The history is provided by the patient. No language interpreter was used.    Past Medical History:  Diagnosis Date  . Asthma   . IBS (irritable bowel syndrome)   . Rectal bleeding   . Wears glasses     There are no active problems to display for this patient.   Past Surgical History:  Procedure Laterality Date  . APPENDECTOMY  1999  . colonoscopy  2008, 2014  . EVALUATION UNDER ANESTHESIA WITH FISTULECTOMY N/A 10/09/2015   Procedure: EXAM UNDER ANESTHESIA; FISTULOTOMY;  Surgeon: Romie Levee, MD;  Location: [facility];  Service: General;  Laterality: N/A;  . KNEE SURGERY  1993        Home Medications    Prior to Admission medications   Medication Sig Start Date End Date Taking? Authorizing Provider  acetaminophen (TYLENOL) 325 MG tablet Take 650 mg by mouth every 6 (six) hours as needed for mild pain.    [provider]  albuterol (PROVENTIL HFA;VENTOLIN HFA) 108 (90 Base) MCG/ACT inhaler Inhale 2 puffs into the lungs every 6 (six) hours as needed for wheezing or shortness of breath.    [provider]  ibuprofen (ADVIL,MOTRIN) 200 MG tablet Take 200 mg by  mouth every 6 (six) hours as needed.    [provider]  oxyCODONE (OXY IR/ROXICODONE) 5 MG immediate release tablet Take 1-2 tablets (5-10 mg total) by mouth every 4 (four) hours as needed. 10/09/15   Romie Levee, MD  psyllium (METAMUCIL) 58.6 % powder Take 1 packet by mouth Nightly.    [provider]  Specialty Vitamins Products (MAGNESIUM, AMINO ACID CHELATE,) 133 MG tablet Take 1 tablet by mouth 2 (two) times daily.    [provider]    Family History Family History  Problem Relation Age of Onset  . Cancer Paternal Grandfather        Colon    Social History Social History   Tobacco Use  . Smoking status: Former Smoker    Last attempt to quit: 03/03/2005    Years since quitting: 13.4  . Smokeless tobacco: Never Used  Substance Use Topics  . Alcohol use: Yes    Alcohol/week: 2.0 standard drinks    Types: 2 Glasses of wine per week  . Drug use: No     Allergies   Banana   Review of Systems Review of Systems  All other systems reviewed and are negative.    Physical Exam Updated Vital Signs BP (!) 169/117 (BP Location: Left Arm)   Pulse 93   Temp 98.9 F (37.2 C) (Oral)   Resp 18  Ht 5\' 6"  (1.676 m)   Wt 104.3 kg   SpO2 97%   BMI 37.12 kg/m   Physical Exam Vitals signs and nursing note reviewed.  Constitutional:      Appearance: He is well-developed.     Comments: Very uncomfortable appearing  HENT:     Head: Normocephalic and atraumatic.  Eyes:     General: No scleral icterus.       Right eye: No discharge.        Left eye: No discharge.     Conjunctiva/sclera: Conjunctivae normal.     Pupils: Pupils are equal, round, and reactive to light.  Neck:     Musculoskeletal: Normal range of motion and neck supple.     Vascular: No JVD.  Cardiovascular:     Rate and Rhythm: Normal rate and regular rhythm.     Heart sounds: Normal heart sounds. No murmur. No friction rub. No gallop.   Pulmonary:     Effort: Pulmonary effort  is normal. No respiratory distress.     Breath sounds: Normal breath sounds. No wheezing or rales.  Chest:     Chest wall: No tenderness.  Abdominal:     General: There is no distension.     Palpations: Abdomen is soft. There is no mass.     Tenderness: There is no abdominal tenderness. There is no guarding or rebound.  Musculoskeletal: Normal range of motion.        General: No tenderness.  Skin:    General: Skin is warm and dry.  Neurological:     Mental Status: He is alert and oriented to person, place, and time.  Psychiatric:        Behavior: Behavior normal.        Thought Content: Thought content normal.        Judgment: Judgment normal.      ED Treatments / Results  Labs (all labs ordered are listed, but only abnormal results are displayed) Labs Reviewed  BASIC METABOLIC PANEL  CBC  I-STAT TROPONIN, ED  I-STAT CREATININE, ED    EKG None ED ECG REPORT  I personally interpreted this EKG   Date: 08/21/2018   Rate: 89  Rhythm: normal sinus rhythm  QRS Axis: normal  Intervals: normal  ST/T Wave abnormalities: normal  Conduction Disutrbances:none  Narrative Interpretation:   Old EKG Reviewed: none available   Radiology Dg Chest 2 View  Result Date: 08/21/2018 CLINICAL DATA:  Chest pain EXAM: CHEST - 2 VIEW COMPARISON:  None. FINDINGS: Lungs are clear. Heart size and pulmonary vascularity are normal. No adenopathy. No pneumothorax. No bone lesions. IMPRESSION: No edema or consolidation. Electronically Signed   By: Bretta Bang III M.D.   On: 08/21/2018 05:27    Procedures Procedures (including critical care time)  Medications Ordered in ED Medications  sodium chloride flush (NS) 0.9 % injection 3 mL (3 mLs Intravenous Given 08/21/18 0556)  morphine 4 MG/ML injection 4 mg (4 mg Intravenous Given 08/21/18 0555)  ondansetron (ZOFRAN) injection 4 mg (4 mg Intravenous Given 08/21/18 0555)     Initial Impression / Assessment and Plan / ED Course  I have  reviewed the triage vital signs and the nursing notes.  Pertinent labs & imaging results that were available during my care of the patient were reviewed by me and considered in my medical decision making (see chart for details).     Patient with sharp stabbing left sided chest pain that radiates to the back.  Worse  with deep breathing, but not SOB.  Noted to be hypertensive.  No abdominal pain.    CXR negative.  Will check CT.  Labs pending.  Patient seen by and discussed with Dr. Lynelle DoctorKnapp.  Patient signed out to Bill[place]hatri, [place]-C.  Final Clinical Impressions(s) / ED Diagnoses   Final diagnoses:  None    ED Discharge Orders    None       Roxy HorsemanBrowning, Latrish Mogel, PA-C 08/21/18 0606    Devoria AlbeKnapp, Iva, MD 08/21/18 [phone]

## 2018-08-21 NOTE — ED Triage Notes (Signed)
Patient is having left side chest pain. Patient states he was hurting Friday night and Saturday he didn't have any pain. Then he woke up this morning with left chest pain.

## 2018-08-21 NOTE — ED Provider Notes (Signed)
Physical Exam  BP (!) 133/107   Pulse 81   Temp 98.9 F (37.2 C) (Oral)   Resp 20   Ht 5\' 6"  (1.676 m)   Wt 104.3 kg   SpO2 97%   BMI 37.12 kg/m   Physical Exam Vitals signs and nursing note reviewed.  Constitutional:      General: He is not in acute distress.    Appearance: He is well-developed. He is not diaphoretic.  HENT:     Head: Normocephalic and atraumatic.  Eyes:     General: No scleral icterus.    Conjunctiva/sclera: Conjunctivae normal.  Neck:     Musculoskeletal: Normal range of motion.  Pulmonary:     Effort: Pulmonary effort is normal. No respiratory distress.  Skin:    Findings: No rash.  Neurological:     Mental Status: He is alert.     ED Course/Procedures     Procedures  MDM  Care handed off from previous provider PA Warren.  Please see his note for further detail.  Briefly, patient is a 48 year old male who presents to the ED for a chief complaint of left-sided chest pain radiating to his back.  Pain is pleuritic in nature.  Denies shortness of breath.  So far patient has had a negative troponin x1, EKG showing normal sinus rhythm, negative chest x-ray.  Plan is to obtain CTA of the chest to rule out PE, remainder of lab work.  If CTA negative, will need delta troponin (9am). Anticipate discharge home if work-up is reassuring.  Labs Reviewed  BASIC METABOLIC PANEL - Abnormal; Notable for the following components:      Result Value   Glucose, Bld 100 (*)    Calcium 8.8 (*)    All other components within normal limits  CBC  I-STAT TROPONIN, ED  I-STAT CREATININE, ED  POCT I-STAT TROPONIN I  POCT I-STAT CREATININE  I-STAT TROPONIN, ED   7:15 AM CBC, BMP are unremarkable.  Dg Chest 2 View  Result Date: 08/21/2018 CLINICAL DATA:  Chest pain EXAM: CHEST - 2 VIEW COMPARISON:  None. FINDINGS: Lungs are clear. Heart size and pulmonary vascularity are normal. No adenopathy. No pneumothorax. No bone lesions. IMPRESSION: No edema or  consolidation. Electronically Signed   By: Bretta Bang III M.D.   On: 08/21/2018 05:27   Ct Angio Chest Pe W And/or Wo Contrast  Result Date: 08/21/2018 CLINICAL DATA:  Chest pain EXAM: CT ANGIOGRAPHY CHEST WITH CONTRAST TECHNIQUE: Multidetector CT imaging of the chest was performed using the standard protocol during bolus administration of intravenous contrast. Multiplanar CT image reconstructions and MIPs were obtained to evaluate the vascular anatomy. CONTRAST:  ISOVUE-370 IOPAMIDOL (ISOVUE-370) INJECTION 76% COMPARISON:  Chest CT August 21, 2018 FINDINGS: Cardiovascular: There is no demonstrable pulmonary embolus. There is no thoracic aortic aneurysm or dissection. The visualized great vessels appear normal. There is no pericardial effusion or pericardial thickening. Mediastinum/Nodes: Thyroid appears normal. There is no appreciable thoracic adenopathy. No esophageal lesions are evident. Lungs/Pleura: There is a small left pleural effusion. There is bibasilar atelectasis. There is no appreciable edema or consolidation. Upper Abdomen: There is reflux of contrast into the inferior vena cava and hepatic veins. Visualized upper abdominal structures appear unremarkable. Musculoskeletal: There are no blastic or lytic bone lesions. There is no evident chest wall lesion. Review of the MIP images confirms the above findings. IMPRESSION: 1. No demonstrable pulmonary embolus. No thoracic aortic aneurysm or dissection. 2. Small left pleural effusion. Bibasilar atelectasis. No  consolidation. 3. Reflux of contrast into the inferior vena cava and hepatic veins. This finding may indicate a degree of increase in right heart pressure. 4.  No evident thoracic adenopathy. Electronically Signed   By: Bretta Bang III M.D.   On: 08/21/2018 07:18    7:36 AM CTA is negative for PE.  Does show a small pleural effusion, evidence of possible increase in R heart pressure.  Patient reports some improvement with  morphine given.  Will give Toradol and reassess.   8:14 AM Patient reports significant improvement in his symptoms after Toradol.  Still awaiting delta troponin.  9:42 AM Delta troponin is negative.  Patient with continued improvement in his symptoms.  Will advise him to alternate Tylenol and ibuprofen.  We will have him follow-up with PCP and return to ED for any severe worsening symptoms.  Patient is hemodynamically stable, in NAD, and able to ambulate in the ED. Evaluation does not show pathology that would require ongoing emergent intervention or inpatient treatment. I explained the diagnosis to the patient. Pain has been managed and has no complaints prior to discharge. Patient is comfortable with above plan and is stable for discharge at this time. All questions were answered prior to disposition. Strict return precautions for returning to the ED were discussed. Encouraged follow up with PCP.    Portions of this note were generated with Scientist, clinical (histocompatibility and immunogenetics). Dictation errors may occur despite best attempts at proofreading.    Dietrich Pates, PA-C 08/21/18 9735    Mancel Bale, MD 08/21/18 1331

## 2018-08-21 NOTE — ED Provider Notes (Signed)
Patient states February 14 he had some pain that lasted about an hour.  This morning it woke him up from sleep with pleuritic chest pain.  Patient is sitting on the edge of his stretcher holding his left chest.  He seems painful if he tries to laugh or talk.  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  His calves are soft and nontender, there is no obvious swelling of his lower extremities although he states he feels like his feet have been swelling and the shoes are tight.  Patient is waiting for his creatinine function to return so he can get a CTA done to look for PE.  He was reassured his EKG looked good and we would also evaluate his heart.  # 1 ED ECG REPORT   Date: 08/21/2018  5:04 AM  Rate: 89  Rhythm: normal sinus rhythm  QRS Axis: normal  Intervals: normal  ST/T Wave abnormalities: normal  Conduction Disutrbances:none  Narrative Interpretation: baseline wander  Old EKG Reviewed: none available  I have personally reviewed the EKG tracing and agree with the computerized printout as noted.   #2  ED ECG REPORT   Date: 08/21/2018 06:00 AM  Rate: 87  Rhythm: normal sinus rhythm  QRS Axis: right  Intervals: normal  ST/T Wave abnormalities: normal  Conduction Disutrbances:none  Narrative Interpretation: electrode noise  Old EKG Reviewed: unchanged  I have personally reviewed the EKG tracing and agree with the computerized printout as noted.   Medical screening examination/treatment/procedure(s) were conducted as a shared visit with non-physician practitioner(s) and myself.  I personally evaluated the patient during the encounter.     Devoria Albe, MD, Concha Pyo, MD 08/21/18 [phone]

## 2019-05-06 ENCOUNTER — Emergency Department (HOSPITAL_COMMUNITY)
Admission: EM | Admit: 2019-05-06 | Discharge: 2019-05-06 | Disposition: A | Discharge status: Home / Self Care | Payer: Commercial Managed Care - PPO | Attending: Emergency Medicine | Admitting: Emergency Medicine

## 2019-05-06 ENCOUNTER — Emergency Department (HOSPITAL_COMMUNITY): Payer: Commercial Managed Care - PPO

## 2019-05-06 ENCOUNTER — Encounter (HOSPITAL_COMMUNITY): Payer: Self-pay

## 2019-05-06 ENCOUNTER — Other Ambulatory Visit: Payer: Self-pay

## 2019-05-06 DIAGNOSIS — R110 Nausea: Secondary | ICD-10-CM | POA: Diagnosis not present

## 2019-05-06 DIAGNOSIS — Z87891 Personal history of nicotine dependence: Secondary | ICD-10-CM | POA: Diagnosis not present

## 2019-05-06 DIAGNOSIS — J45909 Unspecified asthma, uncomplicated: Secondary | ICD-10-CM | POA: Insufficient documentation

## 2019-05-06 DIAGNOSIS — R197 Diarrhea, unspecified: Secondary | ICD-10-CM | POA: Diagnosis not present

## 2019-05-06 DIAGNOSIS — Z79899 Other long term (current) drug therapy: Secondary | ICD-10-CM | POA: Insufficient documentation

## 2019-05-06 DIAGNOSIS — K5792 Diverticulitis of intestine, part unspecified, without perforation or abscess without bleeding: Secondary | ICD-10-CM | POA: Insufficient documentation

## 2019-05-06 DIAGNOSIS — R1030 Lower abdominal pain, unspecified: Secondary | ICD-10-CM | POA: Diagnosis present

## 2019-05-06 LAB — COMPREHENSIVE METABOLIC PANEL
ALT: 19 U/L (ref 0–44)
AST: 20 U/L (ref 15–41)
Albumin: 4.1 g/dL (ref 3.5–5.0)
Alkaline Phosphatase: 72 U/L (ref 38–126)
Anion gap: 11 (ref 5–15)
BUN: 10 mg/dL (ref 6–20)
CO2: 23 mmol/L (ref 22–32)
Calcium: 9 mg/dL (ref 8.9–10.3)
Chloride: 104 mmol/L (ref 98–111)
Creatinine, Ser: 0.64 mg/dL (ref 0.61–1.24)
GFR calc Af Amer: >60 mL/min (ref >60)
GFR calc non Af Amer: >60 mL/min (ref >60)
Glucose, Bld: 99 mg/dL (ref 70–99)
Potassium: 4.1 mmol/L (ref 3.5–5.1)
Sodium: 138 mmol/L (ref 135–145)
Total Bilirubin: 2.2 mg/dL — ABNORMAL HIGH (ref 0.3–1.2)
Total Protein: 7.3 g/dL (ref 6.5–8.1)

## 2019-05-06 LAB — CBC WITH DIFFERENTIAL/PLATELET
Abs Immature Granulocytes: 0.07 10*3/uL (ref 0.00–0.07)
Basophils Absolute: 0 10*3/uL (ref 0.0–0.1)
Basophils Relative: 0 %
Eosinophils Absolute: 0 10*3/uL (ref 0.0–0.5)
Eosinophils Relative: 0 %
HCT: 44 % (ref 39.0–52.0)
Hemoglobin: 15 g/dL (ref 13.0–17.0)
Immature Granulocytes: 0 %
Lymphocytes Relative: 7 %
Lymphs Abs: 1.1 10*3/uL (ref 0.7–4.0)
MCH: 31.4 pg (ref 26.0–34.0)
MCHC: 34.1 g/dL (ref 30.0–36.0)
MCV: 92.2 fL (ref 80.0–100.0)
Monocytes Absolute: 1 10*3/uL (ref 0.1–1.0)
Monocytes Relative: 7 %
Neutro Abs: 13.8 10*3/uL — ABNORMAL HIGH (ref 1.7–7.7)
Neutrophils Relative %: 86 %
Platelets: 216 10*3/uL (ref 150–400)
RBC: 4.77 MIL/uL (ref 4.22–5.81)
RDW: 12.9 % (ref 11.5–15.5)
WBC: 16.1 10*3/uL — ABNORMAL HIGH (ref 4.0–10.5)
nRBC: 0 % (ref 0.0–0.2)

## 2019-05-06 LAB — URINALYSIS, ROUTINE W REFLEX MICROSCOPIC
Bilirubin Urine: NEGATIVE
Glucose, UA: NEGATIVE mg/dL
Hgb urine dipstick: NEGATIVE
Ketones, ur: 20 mg/dL — AB
Leukocytes,Ua: NEGATIVE
Nitrite: NEGATIVE
Protein, ur: NEGATIVE mg/dL
Specific Gravity, Urine: 1.023 (ref 1.005–1.030)
pH: 5 (ref 5.0–8.0)

## 2019-05-06 LAB — LIPASE, BLOOD: Lipase: 22 U/L (ref 11–51)

## 2019-05-06 MED ORDER — IOHEXOL 300 MG/ML  SOLN
100.0000 mL | Freq: Once | INTRAMUSCULAR | Status: AC | PRN
  Administered 2019-05-06: 100 mL via INTRAVENOUS

## 2019-05-06 MED ORDER — ONDANSETRON HCL 4 MG/2ML IJ SOLN
4.0000 mg | Freq: Once | INTRAMUSCULAR | Status: AC
  Administered 2019-05-06: 4 mg via INTRAVENOUS
  Filled 2019-05-06: qty 2

## 2019-05-06 MED ORDER — MORPHINE SULFATE (PF) 4 MG/ML IV SOLN
4.0000 mg | Freq: Once | INTRAVENOUS | Status: AC
  Administered 2019-05-06: 4 mg via INTRAVENOUS
  Filled 2019-05-06: qty 1

## 2019-05-06 MED ORDER — MORPHINE SULFATE (PF) 2 MG/ML IV SOLN
2.0000 mg | Freq: Once | INTRAVENOUS | Status: AC
  Administered 2019-05-06: 2 mg via INTRAVENOUS
  Filled 2019-05-06: qty 1

## 2019-05-06 MED ORDER — OXYCODONE-ACETAMINOPHEN 5-325 MG PO TABS: 1.0000 | ORAL_TABLET | Freq: Four times a day (QID) | ORAL | 0 refills | Status: DC | PRN

## 2019-05-06 MED ORDER — METRONIDAZOLE 500 MG PO TABS
500.0000 mg | ORAL_TABLET | Freq: Once | ORAL | Status: AC
  Administered 2019-05-06: 500 mg via ORAL
  Filled 2019-05-06: qty 1

## 2019-05-06 MED ORDER — SODIUM CHLORIDE 0.9 % IV BOLUS
1000.0000 mL | Freq: Once | INTRAVENOUS | Status: AC
  Administered 2019-05-06: 1000 mL via INTRAVENOUS

## 2019-05-06 MED ORDER — METRONIDAZOLE 500 MG PO TABS: 500.0000 mg | ORAL_TABLET | Freq: Three times a day (TID) | ORAL | 0 refills | Status: DC

## 2019-05-06 MED ORDER — SODIUM CHLORIDE (PF) 0.9 % IJ SOLN
INTRAMUSCULAR | Status: AC
  Administered 2019-05-06: 19:00:00 10 mL
  Filled 2019-05-06: qty 50

## 2019-05-06 MED ORDER — CIPROFLOXACIN HCL 500 MG PO TABS
500.0000 mg | ORAL_TABLET | Freq: Once | ORAL | Status: AC
  Administered 2019-05-06: 500 mg via ORAL
  Filled 2019-05-06: qty 1

## 2019-05-06 MED ORDER — CIPROFLOXACIN HCL 500 MG PO TABS: 500.0000 mg | ORAL_TABLET | Freq: Two times a day (BID) | ORAL | 0 refills | Status: DC

## 2019-05-06 NOTE — Discharge Instructions (Signed)
Take Flagyl 500mg  three times daily for one week Take Cipro 500mg  twice daily for one week Take Percocet as needed for severe pain. Do not drink alcohol or drive while taking this medicine Take Ibuprofen or Tylenol for mild-moderate pain Please follow up with GI doctor Return to the ED for worsening symptoms

## 2019-05-06 NOTE — ED Triage Notes (Signed)
Pt presents with c/o abdominal pain since yesterday around lunch. Pt reports he does have a hx of IBS but that this pain is much worse. Pt reports that he had an episode of diarrhea yesterday but today is not able to pass gas. Pt reports he has had trouble urinating, only able to pass small amounts at a time.

## 2019-05-06 NOTE — ED Provider Notes (Signed)
48 year old male with hx of IBS-C and prior appendectomy presents with lower abdominal pain for one day with associated nausea and and constipation. At shift change CT scan and labs are pending.   CBC is remarkable for leukocytosis of 16. CMP is remarkable for mild elevation of bilirubin (2.2). Lipase is normal. Pain is improved after 2mg  Morphine. On exam he is generally tender but it's worse in the suprapubic and LLQ. Will give additional dose of morphine and reassess after scan.  CT shows diverticulitis of sigmoid colon with micro-perforation. Dicussed with Dr. Eulis Foster who feels pt could be treated as outpatient. He was given a dose of Flagyl and Cipro and tolerated well. Pain is controlled. Will d/c with GI f/u.   Recardo Evangelist, PA-C 05/06/19 2329    Daleen Bo, MD 05/07/19 1116

## 2019-05-06 NOTE — ED Provider Notes (Signed)
[facility]-EMERGENCY DEPT Provider Note   CSN: [id number] Arrival date & time: 05/06/19  1225     History   Chief Complaint Chief Complaint  Patient presents with  . Abdominal Pain    HPI Jacob Graham is a 48 y.o. male w PMHx IBS, presenting to the ED with complaint of gradual onset of worsening abdominal pain that began yesterday around lunch time. Pt states initially pain felt similar to his pain related to IBS, however it significantly worsening. Pain is mostly located to suprapubic region of his abdomen and will have shooting pains towards his rectum, though denies rectal pain or feeling of stool burden in his rectum. He began feeling nauseous today and had an episode of small amount of watery diarrhea last night. Denies vomiting, fever, constipation, or urinary sx. He treated sx with OTC tylenol and advil without significant improvement. Has had 2 colonoscopies in the last 10 years with reassuring results. No prior hx of diverticulitis. Abd surgeries include appendectomy.     The history is provided by the patient.    Past Medical History:  Diagnosis Date  . Asthma   . IBS (irritable bowel syndrome)   . Rectal bleeding   . Wears glasses     There are no active problems to display for this patient.   Past Surgical History:  Procedure Laterality Date  . APPENDECTOMY  1999  . colonoscopy  2008, 2014  . EVALUATION UNDER ANESTHESIA WITH FISTULECTOMY Graham/A 10/09/2015   Procedure: EXAM UNDER ANESTHESIA; FISTULOTOMY;  Surgeon: Romie LeveeAlicia Thomas, MD;  [facility];  Service: General;  Laterality: Graham/A;  . KNEE SURGERY  1993        Home Medications    Prior to Admission medications   Medication Sig Start Date End Date Taking? Authorizing Provider  acetaminophen (TYLENOL) 325 MG tablet Take 650 mg by mouth every 6 (six) hours as needed for mild pain.    [provider]  acyclovir (ZOVIRAX) 400 MG tablet Take 400 mg by  mouth every 8 (eight) hours. 08/15/18   [provider]  albuterol (PROVENTIL HFA;VENTOLIN HFA) 108 (90 Base) MCG/ACT inhaler Inhale 2 puffs into the lungs every 6 (six) hours as needed for wheezing or shortness of breath.    [provider]  doxycycline (VIBRAMYCIN) 100 MG capsule Take 100 mg by mouth every 12 (twelve) hours. 08/15/18   [provider]  ibuprofen (ADVIL,MOTRIN) 200 MG tablet Take 400 mg by mouth every 6 (six) hours as needed for mild pain.     [provider]  oxyCODONE (OXY IR/ROXICODONE) 5 MG immediate release tablet Take 1-2 tablets (5-10 mg total) by mouth every 4 (four) hours as needed. Patient not taking: Reported on 08/21/2018 10/09/15   Romie Leveehomas, Alicia, MD  psyllium (METAMUCIL) 58.6 % powder Take 1 packet by mouth at bedtime.     [provider]  triamcinolone cream (KENALOG) 0.1 % Apply 1 application topically every 12 (twelve) hours. Apply to rash for no more than 7 days consecutively 08/15/18   [provider]  trimethoprim-polymyxin b (POLYTRIM) ophthalmic solution Place 1 drop into both eyes every 6 (six) hours. 08/15/18   [provider]  VITAMIN D PO Take 1 tablet by mouth 2 (two) times a week.    [provider]    Family History Family History  Problem Relation Age of Onset  . Cancer Paternal Grandfather        Colon    Social  History Social History   Tobacco Use  . Smoking status: Former Smoker    Quit date: 03/03/2005    Years since quitting: 14.1  . Smokeless tobacco: Never Used  Substance Use Topics  . Alcohol use: Yes    Alcohol/week: 2.0 standard drinks    Types: 2 Glasses of wine per week  . Drug use: No     Allergies   Banana   Review of Systems Review of Systems  Constitutional: Negative for fever.  Gastrointestinal: Positive for abdominal pain, diarrhea and nausea. Negative for constipation and vomiting.  Genitourinary: Negative for dysuria, frequency and urgency.   All other systems reviewed and are negative.    Physical Exam Updated Vital Signs BP (!) 136/93 (BP Location: Left Arm)   Pulse 90   Temp 98 F (36.7 C) (Oral)   Resp 18   SpO2 100%   Physical Exam Vitals signs and nursing note reviewed.  Constitutional:      Appearance: He is well-developed.     Comments: Patient appears very uncomfortable  HENT:     Head: Normocephalic and atraumatic.  Eyes:     Conjunctiva/sclera: Conjunctivae normal.  Cardiovascular:     Rate and Rhythm: Normal rate and regular rhythm.  Pulmonary:     Effort: Pulmonary effort is normal. No respiratory distress.     Breath sounds: Normal breath sounds.  Abdominal:     General: Bowel sounds are normal.     Palpations: Abdomen is soft.     Tenderness: There is abdominal tenderness. There is no guarding or rebound.     Comments: Diffusely tender though worse in the suprapubic region  Skin:    General: Skin is warm.  Neurological:     Mental Status: He is alert.  Psychiatric:        Behavior: Behavior normal.      ED Treatments / Results  Labs (all labs ordered are listed, but only abnormal results are displayed) Labs Reviewed  COMPREHENSIVE METABOLIC PANEL  LIPASE, BLOOD  CBC WITH DIFFERENTIAL/PLATELET  URINALYSIS, ROUTINE W REFLEX MICROSCOPIC    EKG None  Radiology No results found.  Procedures Procedures (including critical care time)  Medications Ordered in ED Medications  ondansetron (ZOFRAN) injection 4 mg (has no administration in time range)  sodium chloride 0.9 % bolus 1,000 mL (has no administration in time range)  morphine 2 MG/ML injection 2 mg (has no administration in time range)     Initial Impression / Assessment and Plan / ED Course  I have reviewed the triage vital signs and the nursing notes.  Pertinent labs & imaging results that were available during my care of the patient were reviewed by me and considered in my medical decision making (see chart for  details).        Patient with history of IBS, appendectomy, presenting with gradually worsening suprapubic abdominal pain that began yesterday afternoon.  He has had 1 episode of watery diarrhea and has developed nausea today.  Denies associated urinary symptoms, fever, constipation.  On exam today, he appears very uncomfortable.  Afebrile with stable vital signs.  Abdomen is diffusely tender though worse in the suprapubic region.  Will obtain labs, CT imaging, and provide IV fluids, antiemetics and pain medication for symptoms.  Care assumed at shift change by PA Gekas, pending labs and imaging.  Final Clinical Impressions(s) / ED Diagnoses   Final diagnoses:  None    ED Discharge Orders    None  , Jacob N, PA-C 05/06/19 1739    Hayden Rasmussen, MD 05/07/19 [phone]

## 2019-05-06 NOTE — ED Notes (Signed)
Patient given water and PO meds. Tolerated well.

## 2019-05-08 ENCOUNTER — Encounter: Payer: Self-pay | Admitting: Gastroenterology

## 2019-05-09 ENCOUNTER — Encounter (HOSPITAL_COMMUNITY): Payer: Self-pay

## 2019-05-09 ENCOUNTER — Other Ambulatory Visit: Payer: Self-pay

## 2019-05-09 DIAGNOSIS — Z79899 Other long term (current) drug therapy: Secondary | ICD-10-CM | POA: Insufficient documentation

## 2019-05-09 DIAGNOSIS — J45909 Unspecified asthma, uncomplicated: Secondary | ICD-10-CM | POA: Insufficient documentation

## 2019-05-09 DIAGNOSIS — K5720 Diverticulitis of large intestine with perforation and abscess without bleeding: Secondary | ICD-10-CM | POA: Diagnosis not present

## 2019-05-09 DIAGNOSIS — Z87891 Personal history of nicotine dependence: Secondary | ICD-10-CM | POA: Insufficient documentation

## 2019-05-09 DIAGNOSIS — R1030 Lower abdominal pain, unspecified: Secondary | ICD-10-CM | POA: Diagnosis present

## 2019-05-09 LAB — COMPREHENSIVE METABOLIC PANEL
ALT: 15 U/L (ref 0–44)
AST: 12 U/L — ABNORMAL LOW (ref 15–41)
Albumin: 3.9 g/dL (ref 3.5–5.0)
Alkaline Phosphatase: 63 U/L (ref 38–126)
Anion gap: 11 (ref 5–15)
BUN: 11 mg/dL (ref 6–20)
CO2: 24 mmol/L (ref 22–32)
Calcium: 8.6 mg/dL — ABNORMAL LOW (ref 8.9–10.3)
Chloride: 101 mmol/L (ref 98–111)
Creatinine, Ser: 1.06 mg/dL (ref 0.61–1.24)
GFR calc Af Amer: >60 mL/min (ref >60)
GFR calc non Af Amer: >60 mL/min (ref >60)
Glucose, Bld: 90 mg/dL (ref 70–99)
Potassium: 3.4 mmol/L — ABNORMAL LOW (ref 3.5–5.1)
Sodium: 136 mmol/L (ref 135–145)
Total Bilirubin: 1.4 mg/dL — ABNORMAL HIGH (ref 0.3–1.2)
Total Protein: 7 g/dL (ref 6.5–8.1)

## 2019-05-09 LAB — URINALYSIS, ROUTINE W REFLEX MICROSCOPIC
Bacteria, UA: NONE SEEN
Bilirubin Urine: NEGATIVE
Glucose, UA: NEGATIVE mg/dL
Hgb urine dipstick: NEGATIVE
Ketones, ur: 80 mg/dL — AB
Nitrite: NEGATIVE
Protein, ur: NEGATIVE mg/dL
Specific Gravity, Urine: 1.024 (ref 1.005–1.030)
pH: 6 (ref 5.0–8.0)

## 2019-05-09 LAB — CBC
HCT: 40.5 % (ref 39.0–52.0)
Hemoglobin: 13.7 g/dL (ref 13.0–17.0)
MCH: 30.7 pg (ref 26.0–34.0)
MCHC: 33.8 g/dL (ref 30.0–36.0)
MCV: 90.8 fL (ref 80.0–100.0)
Platelets: 302 10*3/uL (ref 150–400)
RBC: 4.46 MIL/uL (ref 4.22–5.81)
RDW: 12.3 % (ref 11.5–15.5)
WBC: 6.9 10*3/uL (ref 4.0–10.5)
nRBC: 0 % (ref 0.0–0.2)

## 2019-05-09 LAB — LIPASE, BLOOD: Lipase: 21 U/L (ref 11–51)

## 2019-05-09 NOTE — ED Triage Notes (Addendum)
Patient coming from home with complaints of abdominal pain, nausea, back pain, and difficulty urinating. Patient states that he was seen on Saturday and diagnosed with diverticulitis; has had no improvement with antibiotics. Last BM was Friday. Patient states that he was having difficulty urinating and was given Flomax which helped momentarily and has since stopped working. Last time urinating was around 5 PM today.

## 2019-05-10 ENCOUNTER — Encounter (HOSPITAL_COMMUNITY): Payer: Self-pay

## 2019-05-10 ENCOUNTER — Emergency Department (HOSPITAL_COMMUNITY): Payer: Commercial Managed Care - PPO

## 2019-05-10 ENCOUNTER — Emergency Department (HOSPITAL_COMMUNITY)
Admission: EM | Admit: 2019-05-10 | Discharge: 2019-05-10 | Disposition: A | Discharge status: Home / Self Care | Payer: Commercial Managed Care - PPO | Attending: Emergency Medicine | Admitting: Emergency Medicine

## 2019-05-10 DIAGNOSIS — R1030 Lower abdominal pain, unspecified: Secondary | ICD-10-CM

## 2019-05-10 DIAGNOSIS — K5720 Diverticulitis of large intestine with perforation and abscess without bleeding: Secondary | ICD-10-CM

## 2019-05-10 MED ORDER — SODIUM CHLORIDE 0.9 % IV BOLUS
1000.0000 mL | Freq: Once | INTRAVENOUS | Status: AC
  Administered 2019-05-10: 1000 mL via INTRAVENOUS

## 2019-05-10 MED ORDER — SENNOSIDES-DOCUSATE SODIUM 8.6-50 MG PO TABS: 1.0000 | ORAL_TABLET | Freq: Every day | ORAL | 0 refills | Status: AC

## 2019-05-10 MED ORDER — SODIUM CHLORIDE (PF) 0.9 % IJ SOLN
INTRAMUSCULAR | Status: AC
  Filled 2019-05-10: qty 50

## 2019-05-10 MED ORDER — IOHEXOL 300 MG/ML  SOLN
100.0000 mL | Freq: Once | INTRAMUSCULAR | Status: AC | PRN
  Administered 2019-05-10: 100 mL via INTRAVENOUS

## 2019-05-10 MED ORDER — METRONIDAZOLE IN NACL 5-0.79 MG/ML-% IV SOLN: 500.0000 mg | Freq: Once | INTRAVENOUS | Status: DC

## 2019-05-10 MED ORDER — PIPERACILLIN-TAZOBACTAM 3.375 G IVPB 30 MIN
3.3750 g | Freq: Once | INTRAVENOUS | Status: AC
  Administered 2019-05-10: 3.375 g via INTRAVENOUS
  Filled 2019-05-10: qty 50

## 2019-05-10 MED ORDER — MORPHINE SULFATE (PF) 4 MG/ML IV SOLN
4.0000 mg | Freq: Once | INTRAVENOUS | Status: AC
  Administered 2019-05-10: 4 mg via INTRAVENOUS
  Filled 2019-05-10: qty 1

## 2019-05-10 NOTE — ED Notes (Signed)
Pt transported to CT ?

## 2019-05-10 NOTE — Discharge Instructions (Addendum)
Your CT today showed was unchanged from your previous.  Your laboratory results have improved since your last visit.  I have prescribed a short course of senna to help with your constipation.  Please continue to take your antibiotics along with your pain medication to help with your symptoms.  Schedule an appointment to follow-up with your PCP after completion of antibiotics.  If you experience any worsening symptoms, return to the emergency department.

## 2019-05-10 NOTE — ED Notes (Signed)
Assisted patient to restroom and back to stretcher.  

## 2019-05-10 NOTE — ED Provider Notes (Signed)
[facility] DEPT Provider Note   CSN: [id number] Arrival date & time: 05/09/19  2013     History   Chief Complaint Chief Complaint  Patient presents with   Abdominal Pain   Back Pain    HPI Jacob Graham is a 48 y.o. male.     48 y.o male with a PMH of Asthma, IBS presents to the ED with a chief complaint of lower abdominal pain x2 days.  Patient was seen in the ED on Sunday, was diagnosed with diverticulitis with a microperforation, he was sent home on Cipro Flagyl along with Percocet to help with his pain.  He reports picking up his antibiotics on Sunday, has only taking 2 doses of the antibiotic, reports his pain has worsened, he feels a constant pressure to the lower abdomen especially around the suprapubic region.  He also endorses multiple episodes of vomiting today, has not had a bowel movement in the past 3 days.  However, he is passing gas.  He also reports he felt like he was unable to void for the past couple of days, was prescribed Flomax by the PCP at his work, took the flomax at 3 PM this evening, has been urinating since and this has been helping him with gas.  He also endorses a fever of 102.5 at home.  He reports he feels like his symptoms have worsened since he was here 2 days ago.  He has a prior surgical history of appendectomy.  Patient also endorses scrotal swelling, reports he has had pain to bilateral scrotum which began with the symptoms.  Denies any prior history of kidney stones, diarrhea, other complaints.  The history is provided by the patient.    Past Medical History:  Diagnosis Date   Asthma    IBS (irritable bowel syndrome)    Rectal bleeding    Wears glasses     There are no active problems to display for this patient.   Past Surgical History:  Procedure Laterality Date   APPENDECTOMY  1999   colonoscopy  2008, 2014   EVALUATION UNDER ANESTHESIA WITH FISTULECTOMY N/A 10/09/2015   Procedure: EXAM UNDER  ANESTHESIA; FISTULOTOMY;  Surgeon: Leighton Ruff, MD;  Location: [facility];  Service: General;  Laterality: N/A;   [place] Medications    Prior to Admission medications   Medication Sig Start Date End Date Taking? Authorizing Provider  acetaminophen (TYLENOL) 325 MG tablet Take 650 mg by mouth every 6 (six) hours as needed for mild pain.   Yes [provider]  ciprofloxacin (CIPRO) 500 MG tablet Take 1 tablet (500 mg total) by mouth 2 (two) times daily. 05/06/19  Yes Recardo Evangelist, PA-C  ibuprofen (ADVIL,MOTRIN) 200 MG tablet Take 400 mg by mouth every 6 (six) hours as needed for mild pain.    Yes [provider]  metroNIDAZOLE (FLAGYL) 500 MG tablet Take 1 tablet (500 mg total) by mouth 3 (three) times daily. 05/06/19  Yes Recardo Evangelist, PA-C  oxyCODONE-acetaminophen (PERCOCET/ROXICET) 5-325 MG tablet Take 1 tablet by mouth every 6 (six) hours as needed for severe pain. 05/06/19  Yes Recardo Evangelist, PA-C  tamsulosin (FLOMAX) 0.4 MG CAPS capsule Take 0.4 mg by mouth daily.   Yes [provider]  oxyCODONE (OXY IR/ROXICODONE) 5 MG immediate release tablet Take 1-2 tablets (5-10 mg total) by mouth every 4 (four) hours as needed. Patient not taking: Reported  on 08/21/2018 10/09/15   Romie Levee, MD  senna-docusate (SENOKOT-S) 8.6-50 MG tablet Take 1 tablet by mouth daily for 10 days. 05/10/19 05/20/19  Claude Manges, PA-C    Family History Family History  Problem Relation Age of Onset   Cancer Paternal Grandfather        Colon    Social History Social History   Tobacco Use   Smoking status: Former Smoker    Quit date: 03/03/2005    Years since quitting: 14.1   Smokeless tobacco: Never Used  Substance Use Topics   Alcohol use: Yes    Alcohol/week: 2.0 standard drinks    Types: 2 Glasses of wine per week   Drug use: No     Allergies   Banana   Review of Systems Review of Systems    Constitutional: Negative for fever.  HENT: Negative for sore throat.   Respiratory: Negative for shortness of breath.   Cardiovascular: Negative for chest pain.  Gastrointestinal: Positive for abdominal pain, constipation, nausea and vomiting. Negative for blood in stool and diarrhea.  Genitourinary: Positive for difficulty urinating and scrotal swelling.  Musculoskeletal: Negative for back pain.  Skin: Negative for pallor and wound.  Neurological: Negative for light-headedness.     Physical Exam Updated Vital Signs BP (!) 145/95 (BP Location: Right Arm)    Pulse 85    Temp 98.6 F (37 C) (Oral)    Resp 20    Ht  (1.676 m)    Wt 104.8 kg    SpO2 98%    BMI 37.28 kg/m   Physical Exam Vitals signs and nursing note reviewed.  Constitutional:      Appearance: He is well-developed.     Comments: Appears uncomfortable, pacing the room.  HENT:     Head: Normocephalic and atraumatic.  Eyes:     General: No scleral icterus.    Pupils: Pupils are equal, round, and reactive to light.  Neck:     Musculoskeletal: Normal range of motion.  Cardiovascular:     Heart sounds: Normal heart sounds.  Pulmonary:     Effort: Pulmonary effort is normal.     Breath sounds: Normal breath sounds. No wheezing.  Chest:     Chest wall: No tenderness.  Abdominal:     General: Bowel sounds are increased. There is distension.     Palpations: Abdomen is soft.     Tenderness: There is abdominal tenderness in the suprapubic area and left lower quadrant. There is left CVA tenderness and guarding. There is no right CVA tenderness. Negative signs include McBurney's sign.     Comments: Abdomen appears somewhat distended, there is tenderness with palpation of the suprapubic and left lower quadrant.  Bowel sounds are increased.  There is guarding.  Musculoskeletal:        General: No tenderness or deformity.  Skin:    General: Skin is warm and dry.  Neurological:     Mental Status: He is alert and  oriented to person, place, and time.      ED Treatments / Results  Labs (all labs ordered are listed, but only abnormal results are displayed) Labs Reviewed  COMPREHENSIVE METABOLIC PANEL - Abnormal; Notable for the following components:      Result Value   Potassium 3.4 (*)    Calcium 8.6 (*)    AST 12 (*)    Total Bilirubin 1.4 (*)    All other components within normal limits  URINALYSIS, ROUTINE W REFLEX MICROSCOPIC -  Abnormal; Notable for the following components:   Ketones, ur 80 (*)    Leukocytes,Ua SMALL (*)    All other components within normal limits  LIPASE, BLOOD  CBC    EKG None  Radiology Ct Abdomen Pelvis W Contrast  Result Date: 05/10/2019 CLINICAL DATA:  Nausea, vomiting. EXAM: CT ABDOMEN AND PELVIS WITH CONTRAST TECHNIQUE: Multidetector CT imaging of the abdomen and pelvis was performed using the standard protocol following bolus administration of intravenous contrast. CONTRAST:  100mL OMNIPAQUE IOHEXOL 300 MG/ML  SOLN COMPARISON:  05/06/2019 FINDINGS: Lower chest: Lung bases are clear. No effusions. Heart is normal size. Hepatobiliary: No focal hepatic abnormality. Gallbladder unremarkable. Pancreas: No focal abnormality or ductal dilatation. Spleen: No focal abnormality.  Normal size. Adrenals/Urinary Tract: No adrenal abnormality. No focal renal abnormality. No stones or hydronephrosis. Urinary bladder is unremarkable. Stomach/Bowel: There Is sigmoid diverticulosis. Inflammatory stranding and locules of extraluminal gas again noted in the adjacent fat, similar to prior study. No drainable fluid collection. No evidence of bowel obstruction. Vascular/Lymphatic: No evidence of aneurysm or adenopathy. Reproductive: Prostate calcifications. Other: No free fluid Musculoskeletal: No acute bony abnormality. IMPRESSION: Changes of sigmoid diverticulitis. Continued stranding and extraluminal gas in the adjacent fat compatible with micro perforation. Appearance is similar to  prior study. Electronically Signed   By: Charlett NoseKevin  Dover M.D.   On: 05/10/2019 01:59    Procedures Procedures (including critical care time)  Medications Ordered in ED Medications  sodium chloride (PF) 0.9 % injection (has no administration in time range)  morphine 4 MG/ML injection 4 mg (4 mg Intravenous Given 05/10/19 0057)  sodium chloride 0.9 % bolus 1,000 mL (1,000 mLs Intravenous New Bag/Given (Non-Interop) 05/10/19 0057)  piperacillin-tazobactam (ZOSYN) IVPB 3.375 g (0 g Intravenous Stopped 05/10/19 0137)  iohexol (OMNIPAQUE) 300 MG/ML solution 100 mL (100 mLs Intravenous Contrast Given 05/10/19 0140)     Initial Impression / Assessment and Plan / ED Course  I have reviewed the triage vital signs and the nursing notes.  Pertinent labs & imaging results that were available during my care of the patient were reviewed by me and considered in my medical decision making (see chart for details).    Patient with a past medical history of IBS C presents to the ED with a chief complaint of lower abdominal pain x2 days.  [facility], diagnosed with diverticulitis with perforation, he was sent home on outpatient Flagyl and Cipro along with Percocet to help with his symptoms.  He reports his symptoms are now worsening, he has had significant tenderness along the suprapubic area, he also has difficulty urinating, reports he is unable to fully void.  He was seen by physician at work, reports he was given a prescription for Flomax to help with his symptoms, reports he has urinated since.  Patient has not had a bowel movement in the past 3 days however he is passing gas.  He also reports a fever of 102.5, has had multiple episodes of emesis.    During my exam patient appears uncomfortable, he is pacing the room holding onto his right hip, states there is significant tenderness along the suprapubic region along with some scrotal swelling.  Does not have a prior history of  kidney stones.  Bowel sounds are increased, there is distention along with guarding on my exam.  Discussed risks and benefits of obtaining CT imaging as patient had a CT 2 days ago, as his symptoms seem to have worsened.  Patient agree with this at this time.  CBC without any leukocytosis, this is improved from his previous one.  CMP with some mild hypokalemia, suspect this is likely due from his vomiting.  Current level is within normal limits.  LFTs are within normal limits.  Lipase level is within normal limits.  UA with a small amount of leukocytes, no nitrites, WBC.    CT abdomen/pelvis with contrast: Changes of sigmoid diverticulitis. Continued stranding and  extraluminal gas in the adjacent fat compatible with micro  perforation. Appearance is similar to prior study.       2:38 AM these results were discussed at length with patient, he ports improvement in symptoms after IV antibiotics along with morphine, will have patient continue antibiotic regimen with Cipro Flagyl, he will sleep E prescribed senna in order to help with his constipation.  Patient is agreeable of going home, he is remained afebrile in the ED, he is nontoxic-appearing.  No signs of bowel obstruction.  Patient otherwise stable for discharge.   Portions of this note were generated with Scientist, clinical (histocompatibility and immunogenetics). Dictation errors may occur despite best attempts at proofreading.  Final Clinical Impressions(s) / ED Diagnoses   Final diagnoses:  Perforation of sigmoid colon due to diverticulitis  Lower abdominal pain    ED Discharge Orders         Ordered    senna-docusate (SENOKOT-S) 8.6-50 MG tablet  Daily     05/10/19 0232           Claude Manges, PA-C 05/10/19 0240    Nira Conn, MD 05/10/19 [phone]

## 2019-06-15 ENCOUNTER — Encounter

## 2019-06-15 ENCOUNTER — Ambulatory Visit (INDEPENDENT_AMBULATORY_CARE_PROVIDER_SITE_OTHER): Payer: Commercial Managed Care - PPO | Admitting: Gastroenterology

## 2019-06-15 ENCOUNTER — Encounter: Payer: Self-pay | Admitting: Gastroenterology

## 2019-06-15 VITALS — BP 116/78 | HR 86 | Temp 96.9°F | Ht 66.0 in | Wt 205.4 lb

## 2019-06-15 DIAGNOSIS — Z1159 Encounter for screening for other viral diseases: Secondary | ICD-10-CM

## 2019-06-15 DIAGNOSIS — Z8719 Personal history of other diseases of the digestive system: Secondary | ICD-10-CM

## 2019-06-15 DIAGNOSIS — K5720 Diverticulitis of large intestine with perforation and abscess without bleeding: Secondary | ICD-10-CM

## 2019-06-15 DIAGNOSIS — R1032 Left lower quadrant pain: Secondary | ICD-10-CM

## 2019-06-15 MED ORDER — NA SULFATE-K SULFATE-MG SULF 17.5-3.13-1.6 GM/177ML PO SOLN: ORAL | 0 refills | Status: DC

## 2019-06-15 MED ORDER — DIPHENHYDRAMINE HCL 50 MG PO TABS: ORAL_TABLET | ORAL | 0 refills | Status: DC

## 2019-06-15 MED ORDER — PREDNISONE 50 MG PO TABS: ORAL_TABLET | ORAL | 0 refills | Status: DC

## 2019-06-15 NOTE — Addendum Note (Signed)
Addended by: Oda Kilts on: 06/15/2019 10:01 AM   Modules accepted: Orders

## 2019-06-15 NOTE — Patient Instructions (Addendum)
You have been scheduled for a CT scan of the abdomen and pelvis at [facility] ([address]---this is in the same building as [employer]).   You are scheduled on 06/20/2019 at 2pm. You should arrive 15 minutes prior to your appointment time for registration. Please follow the written instructions below on the day of your exam:  WARNING: IF YOU ARE ALLERGIC TO IODINE/X-RAY DYE, PLEASE NOTIFY RADIOLOGY IMMEDIATELY AT [phone]! YOU WILL BE GIVEN A 13 HOUR PREMEDICATION PREP.  1) Do not eat or drink anything after 10am (4 hours prior to your test) 2) You have been given 2 bottles of oral contrast to drink. The solution may taste better if refrigerated, but do NOT add ice or any other liquid to this solution. Shake well before drinking.    Drink 1 bottle of contrast @ 12pm (2 hours prior to your exam)  Drink 1 bottle of contrast @ 1pm (1 hour prior to your exam)  You may take any medications as prescribed with a small amount of water, if necessary. If you take any of the following medications: METFORMIN, GLUCOPHAGE, GLUCOVANCE, AVANDAMET, RIOMET, FORTAMET, [place] MET, JANUMET, GLUMETZA or METAGLIP, you MAY be asked to HOLD this medication 48 hours AFTER the exam.  The purpose of you drinking the oral contrast is to aid in the visualization of your intestinal tract. The contrast solution may cause some diarrhea. Depending on your individual set of symptoms, you may also receive an intravenous injection of x-ray contrast/dye. Plan on being at [facility] for 30 minutes or longer, depending on the type of exam you are having performed.  This test typically takes 30-45 minutes to complete.  If you have any questions regarding your exam or if you need to reschedule, you may call the CT department at [phone] between the hours of 8:00 am and 5:00 pm, Monday-Friday.  ________________________________________________________________________   START Miralax 1  capful daily  We will refer you to [facility] to see Dr Threasa Beards have been scheduled for a colonoscopy. Please follow written instructions given to you at your visit today.  Please pick up your prep supplies at the pharmacy within the next 1-3 days. If you use inhalers (even only as needed), please bring them with you on the day of your procedure.   I appreciate the  opportunity to care for you  Thank You   Harl Bowie , MD     Take Prednisone 13 hours prior to CT Scan           Prednisone  7 hours prior to CT Scan           Prednisone  1 hour prior to CT scan  Take the Benadryl 1 hour prior to CT Scan

## 2019-06-15 NOTE — Progress Notes (Signed)
Jacob HolesMichael F Graham    [id number]    Nov 23, 1970  [facility] Physician:[facility]  [facility]: [facility] [address] [place] [id number]   Chief complaint: Lower abdominal pain  HPI:  48 year old male with history of diverticulitis complicated by microperforation, ER visit October 31 and November 4 with worsening abdominal pain He was started treatment with course of oral antibiotics Cipro and Flagyl on October 31, he was given dose of IV antibiotics on May 10, 2019 and discharged home to complete the course of Cipro and Flagyl.   A week ago had physical with PMD, had tenderness LLQ, was given additional course of antibiotics, he completed it 2 days ago.  Denies any fever or chills.  Has generalized abdominal discomfort and continues to have suprapubic and left lower quadrant pain intermittently.  He was taking metamucil prior to the episode of diverticulitis, was having regular bowel movements but 2 weeks prior to episode he was working out more and became constipated.  His bowel habits have been irregular in the past 2 months.  He has not had a BM since Monday. Denies any rectal bleeding or melena. He had an episode of diverticulitis in 2014 prior to the colonoscopy.  CT abdomen pelvis with contrast May 10, 2019: Changes of sigmoid diverticulitis. Continued stranding and extraluminal gas in the adjacent fat compatible with micro perforation. Appearance is similar to prior study.  CT abdomen pelvis with contrast May 06, 2019: Moderate sigmoid diverticulitis with micro-perforation. No evidence of free intraperitoneal air or abscess.  Surgical history of appendectomy No family history of colon cancer.  He has had 3 colonoscopies so far, 2006, 2008 and 2014 and was diagnosed with IBS. He has history of anal fissure, fistula and also recurrent diverticulitis.    Colonoscopy Pathology 01/02/2013: A. SMALL  INTESTINE, TERMINAL ILEUM, ENDOSCOPIC BIOPSY:  FRAGMENTS OF UNREMARKABLE SMALL INTESTINAL MUCOSA.   NO EVIDENCE OF ENTERITIS.   B. LARGE INTESTINE, RANDOM, ENDOSCOPIC BIOPSY:  FRAGMENTS OF UNREMARKABLE COLONIC MUCOSA.   NO EVIDENCE OF COLITIS   Pathology 07/27/2006: A. BIOPSY, TERMINAL ILEUM:    NO HISTOPATHOLOGICAL ABNORMALITIES.        B. RANDOM COLON BIOPSIES:    NO HISTOPATHOLOGICAL ABNORMALITIES.     Full colonoscopy report is not available during this office visit  Outpatient Encounter Medications as of 06/15/2019  Medication Sig  . acetaminophen (TYLENOL) 325 MG tablet Take 650 mg by mouth every 6 (six) hours as needed for mild pain.  . ciprofloxacin (CIPRO) 500 MG tablet Take 1 tablet (500 mg total) by mouth 2 (two) times daily.  Marland Kitchen. ibuprofen (ADVIL,MOTRIN) 200 MG tablet Take 400 mg by mouth every 6 (six) hours as needed for mild pain.   . metroNIDAZOLE (FLAGYL) 500 MG tablet Take 1 tablet (500 mg total) by mouth 3 (three) times daily.  Marland Kitchen. oxyCODONE (OXY IR/ROXICODONE) 5 MG immediate release tablet Take 1-2 tablets (5-10 mg total) by mouth every 4 (four) hours as needed. (Patient not taking: Reported on 08/21/2018)  . oxyCODONE-acetaminophen (PERCOCET/ROXICET) 5-325 MG tablet Take 1 tablet by mouth every 6 (six) hours as needed for severe pain.  . tamsulosin (FLOMAX) 0.4 MG CAPS capsule Take 0.4 mg by mouth daily.   No facility-administered encounter medications on file as of 06/15/2019.    Allergies as of 06/15/2019 - Review Complete 05/10/2019  Allergen Reaction Noted  . 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 Asthma   . IBS (irritable bowel syndrome)   . Rectal bleeding   . Wears glasses     Past Surgical History:  Procedure Laterality Date  . APPENDECTOMY  1999  . colonoscopy  2008, 2014  . EVALUATION UNDER ANESTHESIA WITH FISTULECTOMY N/A 10/09/2015   Procedure: EXAM UNDER ANESTHESIA; FISTULOTOMY;  Surgeon:  Leighton Ruff, MD;  Location: [facility];  Service: General;  Laterality: N/A;  . KNEE SURGERY  1993    Family History  Problem Relation Age of Onset  . Cancer Paternal Grandfather        Colon    Social History   Socioeconomic History  . Marital status: Married    Spouse name: Not on file  . Number of children: Not on file  . Years of education: Not on file  . Highest education level: Not on file  Occupational History  . Not on file  Tobacco Use  . Smoking status: Former Smoker    Quit date: 03/03/2005    Years since quitting: 14.2  . Smokeless tobacco: Never Used  Substance and Sexual Activity  . Alcohol use: Yes    Alcohol/week: 2.0 standard drinks    Types: 2 Glasses of wine per week  . Drug use: No  . Sexual activity: Not on file  Other Topics Concern  . Not on file  Social History Narrative  . Not on file   Social Determinants of Health   Financial Resource Strain:   . Difficulty of Paying Living Expenses: Not on file  Food Insecurity:   . Worried About Charity fundraiser in the Last Year: Not on file  . Ran Out of Food in the Last Year: Not on file  Transportation Needs:   . Lack of Transportation (Medical): Not on file  . Lack of Transportation (Non-Medical): Not on file  Physical Activity:   . Days of Exercise per Week: Not on file  . Minutes of Exercise per Session: Not on file  Stress:   . Feeling of Stress : Not on file  Social Connections:   . Frequency of Communication with Friends and Family: Not on file  . Frequency of Social Gatherings with Friends and Family: Not on file  . Attends Religious Services: Not on file  . Active Member of Clubs or Organizations: Not on file  . Attends Archivist Meetings: Not on file  . Marital Status: Not on file  Intimate Partner Violence:   . Fear of Current or Ex-Partner: Not on file  . Emotionally Abused: Not on file  . Physically Abused: Not on file  . Sexually Abused: Not on  file      Review of systems: Review of Systems  Constitutional: Negative for fever and chills.  HENT: Negative.   Eyes: Negative for blurred vision.  Respiratory: Negative for cough, shortness of breath and wheezing.   Cardiovascular: Negative for chest pain and palpitations.  Gastrointestinal: as per HPI Genitourinary: Negative for dysuria, urgency, frequency and hematuria.  Musculoskeletal: Negative for myalgias, back pain and joint pain.  Skin: Negative for itching and rash.  Neurological: Negative for dizziness, tremors, focal weakness, seizures and loss of consciousness.  Endo/Heme/Allergies: Negative Psychiatric/Behavioral: Negative for depression, suicidal ideas and hallucinations.  All other systems reviewed and are negative.   Physical Exam: Vitals:   06/15/19 0853  BP: 116/78  Pulse: 86  Temp: (!) 96.9 F (36.1 C)   Body mass index is  33.15 kg/m. Gen:      No acute distress HEENT:  EOMI, sclera anicteric Neck:     No masses; no thyromegaly Lungs:    Clear to auscultation bilaterally; normal respiratory effort CV:         Regular rate and rhythm; no murmurs Abd:      + bowel sounds; soft, mild tenderness in suprapubic and left lower quadrant, no rebound; no palpable masses, no distension Ext:    No edema; adequate peripheral perfusion Skin:      Warm and dry; no rash Neuro: alert and oriented x 3 Psych: normal mood and affect  Data Reviewed:  Reviewed labs, radiology imaging, old records and pertinent past GI work up   Assessment and Plan/Recommendations:  48 year old male with history of anal fissure, anal fistula s/p fistulectomy 2017, recurrent sigmoid diverticulitis with microperforation  He continues to have persistent left lower quadrant and suprapubic pain and is slightly tender on exam Will obtain CT abdomen pelvis with contrast to exclude abscess or persistent inflammation  Schedule for colonoscopy to exclude neoplastic lesion or IBD given last  colonoscopy was greater than 5 years ago. Previous colonoscopies with biopsies in 2008 and 2014 negative for IBD or any mucosal abnormality.  Refer to [facility], Dr. Romie Levee to consider segmental sigmoidectomy for recurrent diverticulitis to prevent further episodes  The risks and benefits as well as alternatives of endoscopic procedure(s) have been discussed and reviewed. All questions answered. The patient agrees to proceed.  Iona Beard , MD    CC: [facility]

## 2019-06-16 ENCOUNTER — Telehealth: Payer: Self-pay | Admitting: *Deleted

## 2019-06-16 NOTE — Telephone Encounter (Signed)
Faxed records today for [facility] referral to Dr Marcello Moores for recurrent diverticulitis

## 2019-06-20 ENCOUNTER — Other Ambulatory Visit: Payer: Self-pay

## 2019-06-20 ENCOUNTER — Ambulatory Visit (INDEPENDENT_AMBULATORY_CARE_PROVIDER_SITE_OTHER)
Admission: RE | Admit: 2019-06-20 | Discharge: 2019-06-20 | Disposition: A | Discharge status: Home / Self Care | Payer: Commercial Managed Care - PPO | Source: Ambulatory Visit | Attending: Gastroenterology | Admitting: Gastroenterology

## 2019-06-20 ENCOUNTER — Telehealth: Payer: Self-pay

## 2019-06-20 DIAGNOSIS — R1032 Left lower quadrant pain: Secondary | ICD-10-CM | POA: Diagnosis not present

## 2019-06-20 DIAGNOSIS — Z8719 Personal history of other diseases of the digestive system: Secondary | ICD-10-CM

## 2019-06-20 MED ORDER — DIPHENHYDRAMINE HCL 50 MG/ML IJ SOLN: 50.0000 mg | Freq: Once | INTRAMUSCULAR | Status: DC

## 2019-06-20 MED ORDER — IOHEXOL 300 MG/ML  SOLN
100.0000 mL | Freq: Once | INTRAMUSCULAR | Status: AC | PRN
  Administered 2019-06-20: 100 mL via INTRAVENOUS

## 2019-06-20 MED ORDER — AMOXICILLIN-POT CLAVULANATE 875-125 MG PO TABS: 1.0000 | ORAL_TABLET | Freq: Two times a day (BID) | ORAL | 0 refills | Status: AC

## 2019-06-20 MED ORDER — DIPHENHYDRAMINE HCL 25 MG PO CAPS: 50.0000 mg | ORAL_CAPSULE | Freq: Once | ORAL | Status: DC

## 2019-06-20 MED ORDER — PREDNISONE 1 MG PO TABS: 50.0000 mg | ORAL_TABLET | Freq: Four times a day (QID) | ORAL | Status: DC

## 2019-06-20 NOTE — Telephone Encounter (Signed)
-----   Message from Mauri Pole, MD sent at 06/20/2019  3:14 PM EST ----- Persistent diverticulitis with microperforation. Called patient and informed results.  He has mild left lower quadrant discomfort but otherwise denies severe pain.  He is having alternating constipation and diarrhea.  Advised him to stay on low residue diet.  Please send Rx for Augmentin 875 twice daily for 10 days.   Advised him to come to ER if he develops worsening abdominal pain, fever, obstipation, nausea or vomiting.    Please send referral to [facility] for segmental colon resection due to recurrent complicated diverticulitis.  He is scheduled for colonoscopy on January 5, please reschedule it to end of January or early February

## 2019-06-20 NOTE — Telephone Encounter (Signed)
Called the patient. No answer. Left a message to call back.  Augmentin 875 BID x 10 days transmitted to [facility].  Changes in appointments as follows: Follow up appt 07/21/19 at 8:10 am with Dr Silverio Decamp Covid screen 08/02/19 at 12:20 (same time as before) Colonoscopy 08/04/19 8:30 (same time as before) Call with an update of any symptoms next week.

## 2019-06-21 NOTE — Telephone Encounter (Signed)
Ok, thank you

## 2019-06-21 NOTE — Telephone Encounter (Signed)
Appointment is currently scheduled on 07/17/2019 at 11:20am   Im calling to get him in sooner based on CT scan results  Shows mild microperforation , the nurse will send message to Dr Marcello Moores and they will contact me back to see if they can see the patient sooner   FYI Dr Silverio Decamp

## 2019-06-22 NOTE — Telephone Encounter (Signed)
FYI Dr Silverio Decamp ===View-only below this line=== ----- Message ----- From: Webb Laws D Sent: 06/22/2019   8:44 AM EST To: Oda Kilts, CMA  Kelly @ [facility] calling back.  They cannot move this patient's appt up any sooner.   Dr. Marcello Moores said for [place] to treat the symptoms until he is seen

## 2019-06-22 NOTE — Telephone Encounter (Signed)
Ok

## 2019-06-23 NOTE — Telephone Encounter (Signed)
Left message on the voicemail of the spouse. The patient did not answer his phone. His voicemail is full and cannot accept messages.

## 2019-06-26 NOTE — Telephone Encounter (Signed)
Spoke with the patient and advised of the changes in the colonoscopy, Covid screening dates and the added OV with Dr Silverio Decamp.

## 2019-06-26 NOTE — Telephone Encounter (Signed)
Patient returning your call.

## 2019-07-08 ENCOUNTER — Other Ambulatory Visit: Payer: Self-pay | Admitting: Nurse Practitioner

## 2019-07-08 ENCOUNTER — Telehealth: Payer: Self-pay | Admitting: Nurse Practitioner

## 2019-07-08 MED ORDER — AMOXICILLIN-POT CLAVULANATE 875-125 MG PO TABS: 1.0000 | ORAL_TABLET | Freq: Two times a day (BID) | ORAL | 0 refills | Status: AC

## 2019-07-08 NOTE — Telephone Encounter (Signed)
The patient call the answering service with a recurrence of left mid abdominal pain which is similar to the pain he experienced with his past diverticulitis.  His past medical history is significant for diverticulitis with a microperforation October 2020.  Was in recent contact with Dr. Lavon Paganini and a repeat abdominal/pelvic CT 06/20/2019 showed inflammatory changes to the proximal mid sigmoid colon consistent with diverticulitis which improved since the prior examination.  Extraluminal gas consistent with microperforation highly increased without an abscess.  He was prescribed Augmentin 875 mg 1 p.o. twice daily.  He stated he felt much better after taking the Augmentin when compared to using Cipro and Flagyl in the past.  He reports changing Metamucil to Benefiber recently which may have altered his bowel pattern.  He was constipated for 3 days then developed recurrence of his left abdominal pain today.  No fever.  He is in no acute distress.  I sent prescription for Augmentin 875 mg 1 p.o. twice daily for 10 days to his pharmacy.  Hold the fiber for 24 hours.  He will go to the local emergency room if he develops severe abdominal pain or fever.  I will have Dr. Elana Alm nurse contact the patient on Monday for further follow-up.  He will call the answering service tomorrow if his symptoms do not improve.

## 2019-07-11 ENCOUNTER — Encounter: Payer: Commercial Managed Care - PPO | Admitting: Gastroenterology

## 2019-07-21 ENCOUNTER — Ambulatory Visit: Payer: Commercial Managed Care - PPO | Admitting: Gastroenterology

## 2019-07-21 ENCOUNTER — Encounter: Payer: Self-pay | Admitting: Gastroenterology

## 2019-07-21 VITALS — BP 118/78 | HR 64 | Temp 97.4°F | Ht 66.0 in | Wt 205.4 lb

## 2019-07-21 DIAGNOSIS — K5732 Diverticulitis of large intestine without perforation or abscess without bleeding: Secondary | ICD-10-CM

## 2019-07-21 DIAGNOSIS — K582 Mixed irritable bowel syndrome: Secondary | ICD-10-CM | POA: Diagnosis not present

## 2019-07-21 DIAGNOSIS — K625 Hemorrhage of anus and rectum: Secondary | ICD-10-CM | POA: Diagnosis not present

## 2019-07-21 DIAGNOSIS — K649 Unspecified hemorrhoids: Secondary | ICD-10-CM | POA: Diagnosis not present

## 2019-07-21 MED ORDER — HYDROCORTISONE ACETATE 25 MG RE SUPP: 25.0000 mg | Freq: Every evening | RECTAL | 1 refills | Status: DC | PRN

## 2019-07-21 MED ORDER — AMOXICILLIN-POT CLAVULANATE 875-125 MG PO TABS: 1.0000 | ORAL_TABLET | Freq: Two times a day (BID) | ORAL | 0 refills | Status: DC

## 2019-07-21 NOTE — Progress Notes (Signed)
Jacob Graham    [id number]    03-Mar-1971  Primary Care Physician:[facility]  Referring Physician: [facility], [place],  [place] [address]   Chief complaint: Left lower quadrant abdominal pain  HPI:  49 year old male with history of recurrent sigmoid diverticulitis complicated by microperforation treated with multiple courses of antibiotics.  He finished recent course of antibiotic [Augmentin] last Monday.  He is having mild discomfort but denies any severe left lower quadrant abdominal pain.  He feels well when he is on antibiotics but once he comes off antibiotics he starts developing abdominal pain.  He is continuing low residue diet.  He continues to have irregular bowel habits with alternating constipation and diarrhea.  He had an episode of rectal bleeding when he was trying to strain excessively with hard stool.  He is taking Benefiber 2-3 times daily  He had consult visit with Dr. Leighton Ruff, plan for elective sigmoidectomy, currently on hold due to cancellation of elective cases during this Covid pandemic surge  Relevant GI history:  CT abdomen pelvis with contrast May 10, 2019: Changes of sigmoid diverticulitis. Continued stranding and extraluminal gas in the adjacent fat compatible with micro perforation. Appearance is similar to prior study.  CT abdomen pelvis with contrast May 06, 2019: Moderate sigmoid diverticulitis with micro-perforation. No evidence of free intraperitoneal air or abscess.  Surgical history of appendectomy No family history of colon cancer.  He has had 3 colonoscopies so far, 2006, 2008 and 2014 and was diagnosed with IBS. He has history of anal fissure, fistula and also recurrent diverticulitis.   Outpatient Encounter Medications as of 07/21/2019  Medication Sig  . acetaminophen (TYLENOL) 325 MG tablet Take 650 mg by mouth every 6 (six) hours as needed for mild pain.  Marland Kitchen  ibuprofen (ADVIL,MOTRIN) 200 MG tablet Take 400 mg by mouth every 6 (six) hours as needed for mild pain.   . [DISCONTINUED] diphenhydrAMINE (BENADRYL) 50 MG tablet Take I pill 1 hour prior to CT scan  . [DISCONTINUED] Na Sulfate-K Sulfate-Mg Sulf 17.5-3.13-1.6 GM/177ML SOLN 1 prep kit  . [DISCONTINUED] predniSONE (DELTASONE) 50 MG tablet For CT contrast prep allergy   No facility-administered encounter medications on file as of 07/21/2019.    Allergies as of 07/21/2019 - Review Complete 07/21/2019  Allergen Reaction Noted  . 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 Iodinated diagnostic agents Other (See Comments) 06/15/2019    Past Medical History:  Diagnosis Date  . Asthma   . Diverticulitis   . IBS (irritable bowel syndrome)   . Rectal bleeding   . Wears glasses     Past Surgical History:  Procedure Laterality Date  . APPENDECTOMY  1999  . colonoscopy  2008, 2014  . EVALUATION UNDER ANESTHESIA WITH FISTULECTOMY N/A 10/09/2015   Procedure: EXAM UNDER ANESTHESIA; FISTULOTOMY;  Surgeon: Leighton Ruff, MD;  Location: [facility];  Service: General;  Laterality: N/A;  . KNEE SURGERY  1993    Family History  Problem Relation Age of Onset  . Seizures Mother        partial complex   . Cancer Paternal Grandfather        Colon    Social History   Socioeconomic History  . Marital status: Married    Spouse name: Not on file  . Number of children: Not on file  . Years of education: Not on file  . Highest education level: Not on  file  Occupational History  . Not on file  Tobacco Use  . Smoking status: Former Smoker    Types: Cigarettes    Quit date: 03/03/2004    Years since quitting: 15.3  . Smokeless tobacco: Former Network engineer and Sexual Activity  . Alcohol use: Yes    Alcohol/week: 2.0 standard drinks    Types: 2 Glasses of wine per week    Comment: occ  . Drug use: No  . Sexual activity: Not on file  Other Topics Concern  . Not on file  Social History  Narrative  . Not on file   Social Determinants of Health   Financial Resource Strain:   . Difficulty of Paying Living Expenses: Not on file  Food Insecurity:   . Worried About Charity fundraiser in the Last Year: Not on file  . Ran Out of Food in the Last Year: Not on file  Transportation Needs:   . Lack of Transportation (Medical): Not on file  . Lack of Transportation (Non-Medical): Not on file  Physical Activity:   . Days of Exercise per Week: Not on file  . Minutes of Exercise per Session: Not on file  Stress:   . Feeling of Stress : Not on file  Social Connections:   . Frequency of Communication with Friends and Family: Not on file  . Frequency of Social Gatherings with Friends and Family: Not on file  . Attends Religious Services: Not on file  . Active Member of Clubs or Organizations: Not on file  . Attends Archivist Meetings: Not on file  . Marital Status: Not on file  Intimate Partner Violence:   . Fear of Current or Ex-Partner: Not on file  . Emotionally Abused: Not on file  . Physically Abused: Not on file  . Sexually Abused: Not on file      Review of systems: Review of Systems  Constitutional: Negative for fever and chills.  HENT: Negative.   Eyes: Negative for blurred vision.  Respiratory: Negative for cough, shortness of breath and wheezing.   Cardiovascular: Negative for chest pain and palpitations.  Gastrointestinal: as per HPI Genitourinary: Negative for dysuria, urgency, frequency and hematuria.  Musculoskeletal: Negative for myalgias, back pain and joint pain.  Skin: Negative for itching and rash.  Neurological: Negative for dizziness, tremors, focal weakness, seizures and loss of consciousness.  Endo/Heme/Allergies: Negative Psychiatric/Behavioral: Negative for depression, suicidal ideas and hallucinations.  All other systems reviewed and are negative.   Physical Exam: Vitals:   07/21/19 0806  BP: 118/78  Pulse: 64  Temp: (!)  97.4 F (36.3 C)   Body mass index is 33.15 kg/m. Gen:      No acute distress HEENT:  EOMI, sclera anicteric Neck:     No masses; no thyromegaly Lungs:    Clear to auscultation bilaterally; normal respiratory effort CV:         Regular rate and rhythm; no murmurs Abd:      + bowel sounds; soft, mild left lower quadrant tenderness on deep palpation; no palpable masses, no distension Ext:    No edema; adequate peripheral perfusion Skin:      Warm and dry; no rash Neuro: alert and oriented x 3 Psych: normal mood and affect Rectal exam: Normal anal sphincter tone, no anal fissure or external hemorrhoids  Data Reviewed:  Reviewed labs, radiology imaging, old records and pertinent past GI work up   Assessment and Plan/Recommendations:  49 year old male with recurrent sigmoid  diverticulitis complicated by microperforation, treated with oral antibiotics course X4 past 2 months  Plan for segmental sigmoidectomy once able to schedule elective cases by Dr. Leighton Ruff  He is scheduled for colonoscopy later this month to exclude any neoplastic lesion The risks and benefits as well as alternatives of endoscopic procedure(s) have been discussed and reviewed. All questions answered. The patient agrees to proceed.  Patient was given prescription for Augmentin 875 mg twice daily for 10 days to use if he develops worsening left lower quadrant abdominal pain or symptoms suggestive of recurrent diverticulitis  Continue low residue diet  Continue Benefiber 1 tablespoon 2-3 times daily with meals  MiraLAX 1 capful daily as needed  Rectal bleeding secondary to hemorrhage from internal hemorrhoids: Advised patient to avoid excessive straining  Anusol suppository at bedtime as needed for 5 to 7 days  This visit required 40 minutes of patient care (this includes precharting, chart review, review of results, face-to-face time used for counseling as well as treatment plan and follow-up. The patient was  provided an opportunity to ask questions and all were answered. The patient agreed with the plan and demonstrated an understanding of the instructions.  Damaris Hippo , MD    CC: [facility], [facility]

## 2019-07-21 NOTE — Patient Instructions (Signed)
You have been scheduled for a colonoscopy. Please follow written instructions given to you at your visit today.  Please pick up your prep supplies at the pharmacy within the next 1-3 days. If you use inhalers (even only as needed), please bring them with you on the day of your procedure.   We will send Augmentin to your pharmacy  Continue Benefiber  Take Miralax 1 capful daily as needed  Take anusol suppositories as needed at bedtime   If you are age 50 or older, your body mass index should be between 23-30. Your Body mass index is 33.15 kg/m. If this is out of the aforementioned range listed, please consider follow up with your Primary Care Provider.  If you are age 76 or younger, your body mass index should be between 19-25. Your Body mass index is 33.15 kg/m. If this is out of the aformentioned range listed, please consider follow up with your Primary Care Provider.    I appreciate the  opportunity to care for you  Thank You   Marsa Aris , MD

## 2019-08-02 ENCOUNTER — Other Ambulatory Visit: Payer: Self-pay | Admitting: Gastroenterology

## 2019-08-02 ENCOUNTER — Ambulatory Visit (INDEPENDENT_AMBULATORY_CARE_PROVIDER_SITE_OTHER): Payer: Commercial Managed Care - PPO

## 2019-08-02 DIAGNOSIS — K625 Hemorrhage of anus and rectum: Secondary | ICD-10-CM | POA: Insufficient documentation

## 2019-08-02 DIAGNOSIS — Z973 Presence of spectacles and contact lenses: Secondary | ICD-10-CM | POA: Insufficient documentation

## 2019-08-02 DIAGNOSIS — J45909 Unspecified asthma, uncomplicated: Secondary | ICD-10-CM | POA: Insufficient documentation

## 2019-08-02 DIAGNOSIS — Z1159 Encounter for screening for other viral diseases: Secondary | ICD-10-CM

## 2019-08-02 DIAGNOSIS — K5792 Diverticulitis of intestine, part unspecified, without perforation or abscess without bleeding: Secondary | ICD-10-CM | POA: Insufficient documentation

## 2019-08-02 DIAGNOSIS — K589 Irritable bowel syndrome without diarrhea: Secondary | ICD-10-CM | POA: Insufficient documentation

## 2019-08-03 LAB — SARS CORONAVIRUS 2 (TAT 6-24 HRS): SARS Coronavirus 2: NEGATIVE

## 2019-08-04 ENCOUNTER — Encounter: Payer: Self-pay | Admitting: Gastroenterology

## 2019-08-04 ENCOUNTER — Other Ambulatory Visit: Payer: Self-pay

## 2019-08-04 ENCOUNTER — Ambulatory Visit (AMBULATORY_SURGERY_CENTER): Payer: Commercial Managed Care - PPO | Admitting: Gastroenterology

## 2019-08-04 DIAGNOSIS — K5792 Diverticulitis of intestine, part unspecified, without perforation or abscess without bleeding: Secondary | ICD-10-CM

## 2019-08-04 DIAGNOSIS — K625 Hemorrhage of anus and rectum: Secondary | ICD-10-CM

## 2019-08-04 MED ORDER — SODIUM CHLORIDE 0.9 % IV SOLN: 500.0000 mL | Freq: Once | INTRAVENOUS | Status: DC

## 2019-08-04 NOTE — Patient Instructions (Signed)
Please read handouts provided. Continue present medications. Await pathology results. Return to GI clinic in 3 months.        YOU HAD AN ENDOSCOPIC PROCEDURE TODAY AT THE [facility]:   Refer to the procedure report that was given to you for any specific questions about what was found during the examination.  If the procedure report does not answer your questions, please call your gastroenterologist to clarify.  If you requested that your care partner not be given the details of your procedure findings, then the procedure report has been included in a sealed envelope for you to review at your convenience later.  YOU SHOULD EXPECT: Some feelings of bloating in the abdomen. Passage of more gas than usual.  Walking can help get rid of the air that was put into your GI tract during the procedure and reduce the bloating. If you had a lower endoscopy (such as a colonoscopy or flexible sigmoidoscopy) you may notice spotting of blood in your stool or on the toilet paper. If you underwent a bowel prep for your procedure, you may not have a normal bowel movement for a few days.  Please Note:  You might notice some irritation and congestion in your nose or some drainage.  This is from the oxygen used during your procedure.  There is no need for concern and it should clear up in a day or so.  SYMPTOMS TO REPORT IMMEDIATELY:   Following lower endoscopy (colonoscopy or flexible sigmoidoscopy):  Excessive amounts of blood in the stool  Significant tenderness or worsening of abdominal pains  Swelling of the abdomen that is new, acute  Fever of 100F or higher    For urgent or emergent issues, a gastroenterologist can be reached at any hour by calling [phone].   DIET:  We do recommend a small meal at first, but then you may proceed to your regular diet.  Drink plenty of fluids but you should avoid alcoholic beverages for 24 hours.  ACTIVITY:  You should plan to take it easy for  the rest of today and you should NOT DRIVE or use heavy machinery until tomorrow (because of the sedation medicines used during the test).    FOLLOW UP: Our staff will call the number listed on your records 48-72 hours following your procedure to check on you and address any questions or concerns that you may have regarding the information given to you following your procedure. If we do not reach you, we will leave a message.  We will attempt to reach you two times.  During this call, we will ask if you have developed any symptoms of COVID 19. If you develop any symptoms (ie: fever, flu-like symptoms, shortness of breath, cough etc.) before then, please call [phone].  If you test positive for Covid 19 in the 2 weeks post procedure, please call and report this information to [place].    If any biopsies were taken you will be contacted by phone or by letter within the next 1-3 weeks.  Please call us at [phone] if you have not heard about the biopsies in 3 weeks.    SIGNATURES/CONFIDENTIALITY: You and/or your care partner have signed paperwork which will be entered into your electronic medical record.  These signatures attest to the fact that that the information above on your After Visit Summary has been reviewed and is understood.  Full responsibility of the confidentiality of this discharge information lies with you and/or your care-partner.

## 2019-08-04 NOTE — Progress Notes (Signed)
Temp by JB and vitals by CW 

## 2019-08-04 NOTE — Progress Notes (Signed)
PT taken to PACU. Monitors in place. VSS. Report given to RN. 

## 2019-08-04 NOTE — Op Note (Signed)
[facility] Patient Name: Jacob Graham Procedure Date: 08/04/2019 8:30 AM MRN: [id number] Endoscopist: Napoleon Form , MD Age: 49 Referring MD:  Date of Birth: 1971-04-15 Gender: Male Account #: [id number] Procedure:                Colonoscopy Indications:              Follow-up of diverticulitis Medicines:                Monitored Anesthesia Care Procedure:                Pre-Anesthesia Assessment:                           - Prior to the procedure, a History and Physical                            was performed, and patient medications and                            allergies were reviewed. The patient's tolerance of                            previous anesthesia was also reviewed. The risks                            and benefits of the procedure and the sedation                            options and risks were discussed with the patient.                            All questions were answered, and informed consent                            was obtained. Prior Anticoagulants: The patient has                            taken no previous anticoagulant or antiplatelet                            agents. ASA Grade Assessment: II - A patient with                            mild systemic disease. After reviewing the risks                            and benefits, the patient was deemed in                            satisfactory condition to undergo the procedure.                           After obtaining informed consent, the colonoscope  was passed under direct vision. Throughout the                            procedure, the patient's blood pressure, pulse, and                            oxygen saturations were monitored continuously. The                            Colonoscope was introduced through the anus and                            advanced to the the cecum, identified by                            appendiceal orifice and ileocecal  valve. The                            colonoscopy was performed without difficulty. The                            patient tolerated the procedure well. The quality                            of the bowel preparation was adequate. The                            ileocecal valve, appendiceal orifice, and rectum                            were photographed. Scope In: 8:39:14 AM Scope Out: 8:56:27 AM Scope Withdrawal Time: 0 hours 12 minutes 42 seconds  Total Procedure Duration: 0 hours 17 minutes 13 seconds  Findings:                 The perianal and digital rectal examinations were                            normal.                           A few small-mouthed diverticula were found in the                            sigmoid colon. Peri-diverticular erythema was seen.                            Biopsies were taken with a cold forceps for                            histology.                           Non-bleeding internal hemorrhoids were found during  retroflexion. The hemorrhoids were small.                           The exam was otherwise without abnormality. Complications:            No immediate complications. Estimated Blood Loss:     Estimated blood loss was minimal. Impression:               - Mild diverticulosis in the sigmoid colon.                            Peri-diverticular erythema was seen. Biopsied to                            exclude segmental colitis associated with                            diverticulosis (SCAD)                           - Non-bleeding internal hemorrhoids.                           - The examination was otherwise normal. Recommendation:           - Patient has a contact number available for                            emergencies. The signs and symptoms of potential                            delayed complications were discussed with the                            patient. Return to normal activities tomorrow.                             Written discharge instructions were provided to the                            patient.                           - Resume previous diet.                           - Continue present medications.                           - Await pathology results.                           - Follow up with surgery to schedule segmental                            colectomy given recurrent diverticulitis with  complictaions                           - Repeat colonoscopy in 10 years for screening                            purposes.                           - Return to GI clinic in 3 months. Napoleon Form, MD 08/04/2019 9:14:48 AM This report has been signed electronically.

## 2019-08-04 NOTE — Progress Notes (Signed)
Called to room to assist during endoscopic procedure.  Patient ID and intended procedure confirmed with present staff. Received instructions for my participation in the procedure from the performing physician.  

## 2019-08-08 ENCOUNTER — Telehealth: Payer: Self-pay | Admitting: *Deleted

## 2019-08-08 NOTE — Telephone Encounter (Signed)
  Follow up Call-  Call back number 08/04/2019  Post procedure Call Back phone  # [phone]  Permission to leave phone message Yes  Some recent data might be hidden     Patient questions:  Do you have a fever, pain , or abdominal swelling? No. Pain Score  0 *  Have you tolerated food without any problems? Yes.    Have you been able to return to your normal activities? Yes.    Do you have any questions about your discharge instructions: Diet   No. Medications  No. Follow up visit  No.  Do you have questions or concerns about your Care? No.  Actions: * If pain score is 4 or above: No action needed, pain <4.   1. Have you developed a fever since your procedure? no  2.   Have you had an respiratory symptoms (SOB or cough) since your procedure? no  3.   Have you tested positive for COVID 19 since your procedure no  4.   Have you had any family members/close contacts diagnosed with the COVID 19 since your procedure?  no   If yes to any of these questions please route to Laverna Peace, RN and Jennye Boroughs, Charity fundraiser.

## 2019-08-09 ENCOUNTER — Ambulatory Visit: Payer: Self-pay | Admitting: General Surgery

## 2019-08-11 ENCOUNTER — Encounter: Payer: Self-pay | Admitting: Gastroenterology

## 2019-08-28 ENCOUNTER — Other Ambulatory Visit: Payer: Self-pay | Admitting: Urology

## 2019-09-14 NOTE — Progress Notes (Signed)
PCP - None on file Cardiologist -   Chest x-ray -  EKG - 08-22-18 Stress Test -  ECHO -  Cardiac Cath -   Sleep Study -  CPAP -   Fasting Blood Sugar -  Checks Blood Sugar _____ times a day  Blood Thinner Instructions: Aspirin Instructions: Last Dose:  Anesthesia review:   Patient denies shortness of breath, fever, cough and chest pain at PAT appointment   Patient verbalized understanding of instructions that were given to them at the PAT appointment. Patient was also instructed that they will need to review over the PAT instructions again at home before surgery.

## 2019-09-14 NOTE — Patient Instructions (Addendum)
DUE TO COVID-19 ONLY ONE VISITOR IS ALLOWED TO COME WITH YOU AND STAY IN THE WAITING ROOM ONLY DURING PRE OP AND PROCEDURE DAY OF SURGERY. THE 1 VISITOR MAY VISIT WITH YOU AFTER SURGERY IN YOUR PRIVATE ROOM DURING VISITING HOURS ONLY!  YOU NEED TO HAVE A COVID 19 TEST ON 09-19-19 @ 9:00 AM, THIS TEST MUST BE DONE BEFORE SURGERY, COME  [place], [place] , [address].  ([place]) ONCE YOUR COVID TEST IS COMPLETED, PLEASE BEGIN THE QUARANTINE INSTRUCTIONS AS OUTLINED IN YOUR HANDOUT.                Jacob Graham  09/14/2019   Your procedure is scheduled on: 09-22-19   Report to [facility] Main  Entrance    Report to Admitting at 7:45 AM     Call this number if you have problems the morning of surgery [phone]    Remember: DRINK 2 [place] AT 1000 PM AND 1 PRESURGERY DRINK THE DAY OF THE PROCEDURE 3 HOURS PRIOR TO SCHEDULED SURGERY. NO SOLIDS AFTER MIDNIGHT THE DAY PRIOR TO THE SURGERY. NOTHING BY MOUTH EXCEPT CLEAR LIQUIDS UNTIL THREE HOURS PRIOR TO SCHEDULED SURGERY. PLEASE FINISH PRESURGERY ENSURE DRINK PER SURGEON ORDER 3 HOURS PRIOR TO SCHEDULED SURGERY TIME WHICH NEEDS TO BE COMPLETED AT 6:45 AM.  PLEASE CONSUME A CLEAR LIQUID DIET THE DAY OF YOUR PREP  CLEAR LIQUID DIET   Foods Allowed                                                                     Foods Excluded  Coffee and tea, regular and decaf                             liquids that you cannot  Plain Jell-O any favor except red or purple                                           see through such as: Fruit ices (not with fruit pulp)                                     milk, soups, orange juice  Iced Popsicles                                    All solid food Carbonated beverages, regular and diet                                    Cranberry, grape and apple juices Sports drinks like Gatorade Lightly seasoned clear broth or consume(fat  free) Sugar, honey syrup  Sample Menu Breakfast  Lunch                                     Supper Cranberry juice                    Beef broth                            Chicken broth Jell-O                                     Grape juice                           Apple juice Coffee or tea                        Jell-O                                      Popsicle                                                Coffee or tea                        Coffee or tea  _____________________________________________________________________     BRUSH YOUR TEETH MORNING OF SURGERY AND RINSE YOUR MOUTH OUT, NO CHEWING GUM CANDY OR MINTS.     Take these medicines the morning of surgery with A SIP OF WATER: None. You may use and bring your inhaler                                 You may not have any metal on your body including hair pins and              piercings     Do not wear jewelry, cologne, lotions, powders or deodorant                         Men may shave face and neck.   Do not bring valuables to the hospital. [facility] IS NOT             RESPONSIBLE   FOR VALUABLES.  Contacts, dentures or bridgework may not be worn into surgery.  You may bring an overnight bag      Special Instructions: N/A              Please read over the following fact sheets you were given: _____________________________________________________________________             [facility] - Preparing for Surgery Before surgery, you can play an important role.  Because skin is not sterile, your skin needs to be as free of germs as possible.  You can reduce the number of germs on your skin by washing with CHG (chlorahexidine gluconate) soap before surgery.  CHG is an antiseptic cleaner which kills germs and  bonds with the skin to continue killing germs even after washing. Please DO NOT use if you have an allergy to CHG or antibacterial soaps.  If your skin becomes  reddened/irritated stop using the CHG and inform your nurse when you arrive at Short Stay. Do not shave (including legs and underarms) for at least 48 hours prior to the first CHG shower.  You may shave your face/neck. Please follow these instructions carefully:  1.  Shower with CHG Soap the night before surgery and the  morning of Surgery.  2.  If you choose to wash your hair, wash your hair first as usual with your  normal  shampoo.  3.  After you shampoo, rinse your hair and body thoroughly to remove the  shampoo.                           4.  Use CHG as you would any other liquid soap.  You can apply chg directly  to the skin and wash                       Gently with a scrungie or clean washcloth.  5.  Apply the CHG Soap to your body ONLY FROM THE NECK DOWN.   Do not use on face/ open                           Wound or open sores. Avoid contact with eyes, ears mouth and genitals (private parts).                       Wash face,  Genitals (private parts) with your normal soap.             6.  Wash thoroughly, paying special attention to the area where your surgery  will be performed.  7.  Thoroughly rinse your body with warm water from the neck down.  8.  DO NOT shower/wash with your normal soap after using and rinsing off  the CHG Soap.                9.  Pat yourself dry with a clean towel.            10.  Wear clean pajamas.            11.  Place clean sheets on your bed the night of your first shower and do not  sleep with pets. Day of Surgery : Do not apply any lotions/deodorants the morning of surgery.  Please wear clean clothes to the hospital/surgery center.  FAILURE TO FOLLOW THESE INSTRUCTIONS MAY RESULT IN THE CANCELLATION OF YOUR SURGERY PATIENT SIGNATURE_________________________________  NURSE SIGNATURE__________________________________  ________________________________________________________________________

## 2019-09-15 ENCOUNTER — Encounter (HOSPITAL_COMMUNITY): Payer: Self-pay

## 2019-09-15 ENCOUNTER — Encounter (HOSPITAL_COMMUNITY)
Admission: RE | Admit: 2019-09-15 | Discharge: 2019-09-15 | Disposition: A | Discharge status: Home / Self Care | Payer: Commercial Managed Care - PPO | Source: Ambulatory Visit | Attending: General Surgery | Admitting: General Surgery

## 2019-09-18 ENCOUNTER — Other Ambulatory Visit: Payer: Self-pay

## 2019-09-18 ENCOUNTER — Encounter (HOSPITAL_COMMUNITY)
Admission: RE | Admit: 2019-09-18 | Discharge: 2019-09-18 | Disposition: A | Discharge status: Home / Self Care | Payer: Commercial Managed Care - PPO | Source: Ambulatory Visit | Attending: General Surgery | Admitting: General Surgery

## 2019-09-18 DIAGNOSIS — Z01812 Encounter for preprocedural laboratory examination: Secondary | ICD-10-CM | POA: Insufficient documentation

## 2019-09-18 LAB — CBC
HCT: 44.1 % (ref 39.0–52.0)
Hemoglobin: 14.8 g/dL (ref 13.0–17.0)
MCH: 30.6 pg (ref 26.0–34.0)
MCHC: 33.6 g/dL (ref 30.0–36.0)
MCV: 91.3 fL (ref 80.0–100.0)
Platelets: 253 10*3/uL (ref 150–400)
RBC: 4.83 MIL/uL (ref 4.22–5.81)
RDW: 13.1 % (ref 11.5–15.5)
WBC: 4.2 10*3/uL (ref 4.0–10.5)
nRBC: 0 % (ref 0.0–0.2)

## 2019-09-19 ENCOUNTER — Other Ambulatory Visit (HOSPITAL_COMMUNITY)
Admission: RE | Admit: 2019-09-19 | Discharge: 2019-09-19 | Disposition: A | Discharge status: Home / Self Care | Payer: Commercial Managed Care - PPO | Source: Ambulatory Visit | Attending: General Surgery | Admitting: General Surgery

## 2019-09-19 DIAGNOSIS — Z20822 Contact with and (suspected) exposure to covid-19: Secondary | ICD-10-CM | POA: Insufficient documentation

## 2019-09-19 DIAGNOSIS — Z01812 Encounter for preprocedural laboratory examination: Secondary | ICD-10-CM | POA: Diagnosis present

## 2019-09-19 LAB — SARS CORONAVIRUS 2 (TAT 6-24 HRS): SARS Coronavirus 2: NEGATIVE

## 2019-09-21 ENCOUNTER — Inpatient Hospital Stay (HOSPITAL_COMMUNITY): Payer: Commercial Managed Care - PPO | Admitting: Certified Registered Nurse Anesthetist

## 2019-09-21 MED ORDER — BUPIVACAINE LIPOSOME 1.3 % IJ SUSP
20.0000 mL | Freq: Once | INTRAMUSCULAR | Status: DC
  Filled 2019-09-21: qty 20

## 2019-09-22 ENCOUNTER — Other Ambulatory Visit: Payer: Self-pay

## 2019-09-22 ENCOUNTER — Ambulatory Visit (HOSPITAL_COMMUNITY)
Admission: RE | Admit: 2019-09-22 | Discharge: 2019-09-22 | Disposition: A | Discharge status: Home / Self Care | Payer: Commercial Managed Care - PPO | Source: Ambulatory Visit | Attending: General Surgery | Admitting: General Surgery

## 2019-09-22 ENCOUNTER — Encounter (HOSPITAL_COMMUNITY)
Admission: RE | Disposition: A | Discharge status: Home / Self Care | Payer: Self-pay | Source: Ambulatory Visit | Attending: General Surgery

## 2019-09-22 ENCOUNTER — Encounter (HOSPITAL_COMMUNITY): Payer: Self-pay | Admitting: General Surgery

## 2019-09-22 DIAGNOSIS — Z539 Procedure and treatment not carried out, unspecified reason: Secondary | ICD-10-CM | POA: Diagnosis not present

## 2019-09-22 DIAGNOSIS — J45909 Unspecified asthma, uncomplicated: Secondary | ICD-10-CM | POA: Insufficient documentation

## 2019-09-22 DIAGNOSIS — Z87891 Personal history of nicotine dependence: Secondary | ICD-10-CM | POA: Insufficient documentation

## 2019-09-22 DIAGNOSIS — K5730 Diverticulosis of large intestine without perforation or abscess without bleeding: Secondary | ICD-10-CM | POA: Diagnosis present

## 2019-09-22 LAB — ABO/RH: ABO/RH(D): O POS

## 2019-09-22 LAB — TYPE AND SCREEN
ABO/RH(D): O POS
Antibody Screen: NEGATIVE

## 2019-09-22 SURGERY — COLECTOMY, PARTIAL, ROBOT-ASSISTED, LAPAROSCOPIC
Anesthesia: General

## 2019-09-22 MED ORDER — FENTANYL CITRATE (PF) 250 MCG/5ML IJ SOLN
INTRAMUSCULAR | Status: AC
  Filled 2019-09-22: qty 5

## 2019-09-22 MED ORDER — LACTATED RINGERS IV SOLN: INTRAVENOUS | Status: DC

## 2019-09-22 MED ORDER — ROCURONIUM BROMIDE 10 MG/ML (PF) SYRINGE
PREFILLED_SYRINGE | INTRAVENOUS | Status: AC
  Filled 2019-09-22: qty 10

## 2019-09-22 MED ORDER — PROPOFOL 10 MG/ML IV BOLUS
INTRAVENOUS | Status: AC
  Filled 2019-09-22: qty 20

## 2019-09-22 MED ORDER — SODIUM CHLORIDE 0.9 % IV SOLN
2.0000 g | INTRAVENOUS | Status: DC
  Filled 2019-09-22: qty 2

## 2019-09-22 MED ORDER — MIDAZOLAM HCL 2 MG/2ML IJ SOLN
INTRAMUSCULAR | Status: AC
  Filled 2019-09-22: qty 2

## 2019-09-22 MED ORDER — PHENYLEPHRINE HCL (PRESSORS) 10 MG/ML IV SOLN
INTRAVENOUS | Status: AC
  Filled 2019-09-22: qty 1

## 2019-09-22 MED ORDER — ONDANSETRON HCL 4 MG/2ML IJ SOLN
INTRAMUSCULAR | Status: AC
  Filled 2019-09-22: qty 2

## 2019-09-22 MED ORDER — LIDOCAINE 2% (20 MG/ML) 5 ML SYRINGE
INTRAMUSCULAR | Status: AC
  Filled 2019-09-22: qty 5

## 2019-09-22 MED ORDER — GABAPENTIN 300 MG PO CAPS
300.0000 mg | ORAL_CAPSULE | ORAL | Status: AC
  Administered 2019-09-22: 300 mg via ORAL
  Filled 2019-09-22: qty 1

## 2019-09-22 MED ORDER — ALVIMOPAN 12 MG PO CAPS
12.0000 mg | ORAL_CAPSULE | ORAL | Status: AC
  Administered 2019-09-22: 12 mg via ORAL
  Filled 2019-09-22: qty 1

## 2019-09-22 MED ORDER — KETAMINE HCL 10 MG/ML IJ SOLN
INTRAMUSCULAR | Status: AC
  Filled 2019-09-22: qty 1

## 2019-09-22 MED ORDER — ACETAMINOPHEN 500 MG PO TABS
1000.0000 mg | ORAL_TABLET | ORAL | Status: AC
  Administered 2019-09-22: 1000 mg via ORAL
  Filled 2019-09-22: qty 2

## 2019-09-22 MED ORDER — LIDOCAINE HCL 2 % IJ SOLN
INTRAMUSCULAR | Status: AC
  Filled 2019-09-22: qty 20

## 2019-09-22 NOTE — Anesthesia Preprocedure Evaluation (Signed)
Anesthesia Evaluation  Patient identified by MRN, date of birth, ID band Patient awake    Reviewed: Allergy & Precautions, NPO status , Patient's Chart, lab work & pertinent test results  Airway        Dental   Pulmonary asthma , former smoker,           Cardiovascular negative cardio ROS       Neuro/Psych negative neurological ROS  negative psych ROS   GI/Hepatic negative GI ROS, Neg liver ROS,   Endo/Other  negative endocrine ROS  Renal/GU negative Renal ROS  negative genitourinary   Musculoskeletal negative musculoskeletal ROS (+)   Abdominal   Peds  Hematology negative hematology ROS (+)   Anesthesia Other Findings Colectomy for diverticular disease  Reproductive/Obstetrics                             Anesthesia Physical Anesthesia Plan  ASA: II  Anesthesia Plan: General   Post-op Pain Management:    Induction: Intravenous  PONV Risk Score and Plan: 2 and Midazolam, Dexamethasone and Ondansetron  Airway Management Planned: Oral ETT  Additional Equipment:   Intra-op Plan:   Post-operative Plan: Extubation in OR  Informed Consent: I have reviewed the patients History and Physical, chart, labs and discussed the procedure including the risks, benefits and alternatives for the proposed anesthesia with the patient or authorized representative who has indicated his/her understanding and acceptance.     Dental advisory given  Plan Discussed with: CRNA  Anesthesia Plan Comments:         Anesthesia Quick Evaluation

## 2019-09-22 NOTE — Progress Notes (Signed)
Pt was not given bowel prep.  Surgery cancelled  Vanita Panda, MD  Colorectal and General Surgery [facility]

## 2019-09-25 ENCOUNTER — Other Ambulatory Visit: Payer: Self-pay | Admitting: Urology

## 2019-10-18 ENCOUNTER — Ambulatory Visit: Payer: Self-pay | Admitting: General Surgery

## 2019-10-18 NOTE — Patient Instructions (Addendum)
DUE TO COVID-19 ONLY ONE VISITOR IS ALLOWED TO COME WITH YOU AND STAY IN THE WAITING ROOM ONLY DURING PRE OP AND PROCEDURE DAY OF SURGERY. THE 1 VISITOR MAY VISIT WITH YOU AFTER SURGERY IN YOUR PRIVATE ROOM DURING VISITING HOURS ONLY!  YOU NEED TO HAVE A COVID 19 TEST ON 10-21-19 @ 9:20 AM, THIS TEST MUST BE DONE BEFORE SURGERY, COME  [address], [place] , [address].  [facility]) ONCE YOUR COVID TEST IS COMPLETED, PLEASE BEGIN THE QUARANTINE INSTRUCTIONS AS OUTLINED IN YOUR HANDOUT.                Jacob Graham  10/18/2019   Your procedure is scheduled on: 10-25-19   Report to [facility] Main  Entrance    Report to admitting at 6:30 AM     Call this number if you have problems the morning of surgery [phone]    Remember: DRINK 2 PRESURGERY ENSURE DRINKS THE NIGHT BEFORE SURGERY AT 1000 PM AND 1 PRESURGERY DRINK THE DAY OF THE PROCEDURE 3 HOURS PRIOR TO SCHEDULED SURGERY. NO SOLIDS AFTER MIDNIGHT THE DAY PRIOR TO THE SURGERY. NOTHING BY MOUTH EXCEPT CLEAR LIQUIDS UNTIL THREE HOURS PRIOR TO SCHEDULED SURGERY. PLEASE FINISH PRESURGERY ENSURE DRINK PER SURGEON ORDER 3 HOURS PRIOR TO SCHEDULED SURGERY TIME WHICH NEEDS TO BE COMPLETED AT 5:30 AM_.     CLEAR LIQUID DIET   Foods Allowed                                                                     Foods Excluded  Coffee and tea, regular and decaf                             liquids that you cannot  Plain Jell-O any favor except red or purple                                           see through such as: Fruit ices (not with fruit pulp)                                     milk, soups, orange juice  Iced Popsicles                                    All solid food Carbonated beverages, regular and diet                                    Cranberry, grape and apple juices Sports drinks like Gatorade Lightly seasoned clear broth or consume(fat free) Sugar, honey syrup  Sample Menu Breakfast                                 Lunch  Supper Cranberry juice                    Beef broth                            Chicken broth Jell-O                                     Grape juice                           Apple juice Coffee or tea                        Jell-O                                      Popsicle                                                Coffee or tea                        Coffee or tea  _____________________________________________________________________    Take these medicines the morning of surgery with A SIP OF WATER: BRUSH YOUR TEETH MORNING OF SURGERY AND RINSE YOUR MOUTH OUT, NO CHEWING GUM CANDY OR MINTS.                              You may not have any metal on your body including hair pins and              piercings     Do not wear jewelry, cologne, lotions, powders or deodorant                    Men may shave face and neck.   Do not bring valuables to the hospital. [place] [facility] IS NOT             RESPONSIBLE   FOR VALUABLES.  Contacts, dentures or bridgework may not be worn into surgery.  You may bring a small overnight bag    Special Instructions: Please follow a Clear Liquid Diet the day of prep, per your surgeon's instructions              Please read over the following fact sheets you were given: _____________________________________________________________________             [facility] - Preparing for Surgery Before surgery, you can play an important role.  Because skin is not sterile, your skin needs to be as free of germs as possible.  You can reduce the number of germs on your skin by washing with CHG (chlorahexidine gluconate) soap before surgery.  CHG is an antiseptic cleaner which kills germs and bonds with the skin to continue killing germs even after washing. Please DO NOT use if you have an allergy to CHG or antibacterial soaps.  If your skin becomes reddened/irritated stop using the CHG and inform  your nurse when you arrive at Short Stay.  Do not shave (including legs and underarms) for at least 48 hours prior to the first CHG shower.  You may shave your face/neck. Please follow these instructions carefully:  1.  Shower with CHG Soap the night before surgery and the  morning of Surgery.  2.  If you choose to wash your hair, wash your hair first as usual with your  normal  shampoo.  3.  After you shampoo, rinse your hair and body thoroughly to remove the  shampoo.                           4.  Use CHG as you would any other liquid soap.  You can apply chg directly  to the skin and wash                       Gently with a scrungie or clean washcloth.  5.  Apply the CHG Soap to your body ONLY FROM THE NECK DOWN.   Do not use on face/ open                           Wound or open sores. Avoid contact with eyes, ears mouth and genitals (private parts).                       Wash face,  Genitals (private parts) with your normal soap.             6.  Wash thoroughly, paying special attention to the area where your surgery  will be performed.  7.  Thoroughly rinse your body with warm water from the neck down.  8.  DO NOT shower/wash with your normal soap after using and rinsing off  the CHG Soap.                9.  Pat yourself dry with a clean towel.            10.  Wear clean pajamas.            11.  Place clean sheets on your bed the night of your first shower and do not  sleep with pets. Day of Surgery : Do not apply any lotions/deodorants the morning of surgery.  Please wear clean clothes to the hospital/surgery center.  FAILURE TO FOLLOW THESE INSTRUCTIONS MAY RESULT IN THE CANCELLATION OF YOUR SURGERY PATIENT SIGNATURE_________________________________  NURSE SIGNATURE__________________________________  ________________________________________________________________________

## 2019-10-18 NOTE — Progress Notes (Signed)
Please place surgery orders. Pt is scheduled for PAT appt on 10-19-19. Thank you

## 2019-10-19 ENCOUNTER — Encounter (HOSPITAL_COMMUNITY)
Admission: RE | Admit: 2019-10-19 | Discharge: 2019-10-19 | Disposition: A | Discharge status: Home / Self Care | Payer: Commercial Managed Care - PPO | Source: Ambulatory Visit | Attending: General Surgery | Admitting: General Surgery

## 2019-10-19 ENCOUNTER — Other Ambulatory Visit: Payer: Self-pay

## 2019-10-19 ENCOUNTER — Encounter (HOSPITAL_COMMUNITY): Payer: Self-pay

## 2019-10-19 DIAGNOSIS — Z01818 Encounter for other preprocedural examination: Secondary | ICD-10-CM | POA: Insufficient documentation

## 2019-10-19 LAB — BASIC METABOLIC PANEL
Anion gap: 7 (ref 5–15)
BUN: 15 mg/dL (ref 6–20)
CO2: 26 mmol/L (ref 22–32)
Calcium: 8.8 mg/dL — ABNORMAL LOW (ref 8.9–10.3)
Chloride: 108 mmol/L (ref 98–111)
Creatinine, Ser: 0.77 mg/dL (ref 0.61–1.24)
GFR calc Af Amer: >60 mL/min (ref >60)
GFR calc non Af Amer: >60 mL/min (ref >60)
Glucose, Bld: 108 mg/dL — ABNORMAL HIGH (ref 70–99)
Potassium: 4.2 mmol/L (ref 3.5–5.1)
Sodium: 141 mmol/L (ref 135–145)

## 2019-10-19 LAB — CBC
HCT: 44 % (ref 39.0–52.0)
Hemoglobin: 15.1 g/dL (ref 13.0–17.0)
MCH: 31.8 pg (ref 26.0–34.0)
MCHC: 34.3 g/dL (ref 30.0–36.0)
MCV: 92.6 fL (ref 80.0–100.0)
Platelets: 253 10*3/uL (ref 150–400)
RBC: 4.75 MIL/uL (ref 4.22–5.81)
RDW: 13.2 % (ref 11.5–15.5)
WBC: 4.6 10*3/uL (ref 4.0–10.5)
nRBC: 0 % (ref 0.0–0.2)

## 2019-10-21 ENCOUNTER — Other Ambulatory Visit (HOSPITAL_COMMUNITY)
Admission: RE | Admit: 2019-10-21 | Discharge: 2019-10-21 | Disposition: A | Discharge status: Home / Self Care | Payer: Commercial Managed Care - PPO | Source: Ambulatory Visit | Attending: General Surgery | Admitting: General Surgery

## 2019-10-21 DIAGNOSIS — Z20822 Contact with and (suspected) exposure to covid-19: Secondary | ICD-10-CM | POA: Insufficient documentation

## 2019-10-21 DIAGNOSIS — Z01812 Encounter for preprocedural laboratory examination: Secondary | ICD-10-CM | POA: Diagnosis present

## 2019-10-21 LAB — SARS CORONAVIRUS 2 (TAT 6-24 HRS): SARS Coronavirus 2: NEGATIVE

## 2019-10-24 MED ORDER — BUPIVACAINE LIPOSOME 1.3 % IJ SUSP
20.0000 mL | Freq: Once | INTRAMUSCULAR | Status: DC
  Filled 2019-10-24: qty 20

## 2019-10-24 NOTE — Anesthesia Preprocedure Evaluation (Addendum)
Anesthesia Evaluation  Patient identified by MRN, date of birth, ID band Patient awake    Reviewed: Allergy & Precautions, H&P , NPO status , Patient's Chart, lab work & pertinent test results  Airway Mallampati: II  TM Distance: >3 FB Neck ROM: Full    Dental no notable dental hx. (+) Teeth Intact, Dental Advisory Given   Pulmonary asthma , former smoker,    Pulmonary exam normal breath sounds clear to auscultation       Cardiovascular Exercise Tolerance: Good negative cardio ROS   Rhythm:Regular Rate:Normal     Neuro/Psych negative neurological ROS  negative psych ROS   GI/Hepatic negative GI ROS, Neg liver ROS,   Endo/Other  negative endocrine ROS  Renal/GU negative Renal ROS  negative genitourinary   Musculoskeletal   Abdominal   Peds  Hematology negative hematology ROS (+)   Anesthesia Other Findings   Reproductive/Obstetrics negative OB ROS                            Anesthesia Physical Anesthesia Plan  ASA: II  Anesthesia Plan: General   Post-op Pain Management:    Induction: Intravenous  PONV Risk Score and Plan: 3 and Ondansetron, Dexamethasone and Midazolam  Airway Management Planned: Oral ETT  Additional Equipment:   Intra-op Plan:   Post-operative Plan: Extubation in OR  Informed Consent: I have reviewed the patients History and Physical, chart, labs and discussed the procedure including the risks, benefits and alternatives for the proposed anesthesia with the patient or authorized representative who has indicated his/her understanding and acceptance.     Dental advisory given  Plan Discussed with: CRNA  Anesthesia Plan Comments:        Anesthesia Quick Evaluation  

## 2019-10-25 ENCOUNTER — Encounter (HOSPITAL_COMMUNITY)
Admission: RE | Disposition: A | Discharge status: Home / Self Care | Payer: Self-pay | Source: Ambulatory Visit | Attending: General Surgery

## 2019-10-25 ENCOUNTER — Inpatient Hospital Stay (HOSPITAL_COMMUNITY)
Admission: RE | Admit: 2019-10-25 | Discharge: 2019-10-30 | DRG: 331 | Disposition: A | Discharge status: Home / Self Care | Payer: Commercial Managed Care - PPO | Source: Ambulatory Visit | Attending: General Surgery | Admitting: General Surgery

## 2019-10-25 ENCOUNTER — Inpatient Hospital Stay (HOSPITAL_COMMUNITY): Payer: Commercial Managed Care - PPO | Admitting: Anesthesiology

## 2019-10-25 ENCOUNTER — Other Ambulatory Visit: Payer: Self-pay

## 2019-10-25 ENCOUNTER — Inpatient Hospital Stay (HOSPITAL_COMMUNITY): Payer: Commercial Managed Care - PPO

## 2019-10-25 ENCOUNTER — Inpatient Hospital Stay (HOSPITAL_COMMUNITY): Payer: Commercial Managed Care - PPO | Admitting: Physician Assistant

## 2019-10-25 ENCOUNTER — Encounter (HOSPITAL_COMMUNITY): Payer: Self-pay | Admitting: General Surgery

## 2019-10-25 DIAGNOSIS — N50819 Testicular pain, unspecified: Secondary | ICD-10-CM | POA: Diagnosis not present

## 2019-10-25 DIAGNOSIS — Z8249 Family history of ischemic heart disease and other diseases of the circulatory system: Secondary | ICD-10-CM

## 2019-10-25 DIAGNOSIS — K5790 Diverticulosis of intestine, part unspecified, without perforation or abscess without bleeding: Secondary | ICD-10-CM | POA: Diagnosis present

## 2019-10-25 DIAGNOSIS — Z811 Family history of alcohol abuse and dependence: Secondary | ICD-10-CM

## 2019-10-25 DIAGNOSIS — Z91018 Allergy to other foods: Secondary | ICD-10-CM | POA: Diagnosis not present

## 2019-10-25 DIAGNOSIS — Z820 Family history of epilepsy and other diseases of the nervous system: Secondary | ICD-10-CM

## 2019-10-25 DIAGNOSIS — Z800 Family history of malignant neoplasm of digestive organs: Secondary | ICD-10-CM | POA: Diagnosis not present

## 2019-10-25 DIAGNOSIS — Z91041 Radiographic dye allergy status: Secondary | ICD-10-CM | POA: Diagnosis not present

## 2019-10-25 DIAGNOSIS — Z87891 Personal history of nicotine dependence: Secondary | ICD-10-CM

## 2019-10-25 DIAGNOSIS — K5792 Diverticulitis of intestine, part unspecified, without perforation or abscess without bleeding: Principal | ICD-10-CM | POA: Diagnosis present

## 2019-10-25 DIAGNOSIS — J45909 Unspecified asthma, uncomplicated: Secondary | ICD-10-CM | POA: Diagnosis present

## 2019-10-25 HISTORY — PX: CYSTOSCOPY WITH RETROGRADE PYELOGRAM, URETEROSCOPY AND STENT PLACEMENT: SHX5789

## 2019-10-25 LAB — TYPE AND SCREEN
ABO/RH(D): O POS
Antibody Screen: NEGATIVE

## 2019-10-25 SURGERY — COLECTOMY, PARTIAL, ROBOT-ASSISTED, LAPAROSCOPIC
Anesthesia: General | Site: Ureter

## 2019-10-25 MED ORDER — SACCHAROMYCES BOULARDII 250 MG PO CAPS
250.0000 mg | ORAL_CAPSULE | Freq: Two times a day (BID) | ORAL | Status: DC
  Administered 2019-10-25 – 2019-10-30 (×10): 250 mg via ORAL
  Filled 2019-10-25 (×10): qty 1

## 2019-10-25 MED ORDER — HYDROMORPHONE HCL 1 MG/ML IJ SOLN
0.5000 mg | INTRAMUSCULAR | Status: DC | PRN
  Administered 2019-10-25 – 2019-10-29 (×6): 0.5 mg via INTRAVENOUS
  Filled 2019-10-25 (×7): qty 0.5

## 2019-10-25 MED ORDER — STERILE WATER FOR INJECTION IJ SOLN
INTRAMUSCULAR | Status: DC | PRN
  Administered 2019-10-25: 10 mL

## 2019-10-25 MED ORDER — MIDAZOLAM HCL 2 MG/2ML IJ SOLN
INTRAMUSCULAR | Status: DC | PRN
  Administered 2019-10-25: 2 mg via INTRAVENOUS
  Administered 2019-10-25: 1 mg via INTRAVENOUS

## 2019-10-25 MED ORDER — ENSURE SURGERY PO LIQD
237.0000 mL | Freq: Two times a day (BID) | ORAL | Status: DC
  Administered 2019-10-26 – 2019-10-30 (×7): 237 mL via ORAL
  Filled 2019-10-25 (×11): qty 237

## 2019-10-25 MED ORDER — STERILE WATER FOR INJECTION IJ SOLN
INTRAMUSCULAR | Status: AC
  Filled 2019-10-25: qty 10

## 2019-10-25 MED ORDER — BUPIVACAINE HCL 0.25 % IJ SOLN
INTRAMUSCULAR | Status: AC
  Filled 2019-10-25: qty 1

## 2019-10-25 MED ORDER — ALBUTEROL SULFATE HFA 108 (90 BASE) MCG/ACT IN AERS: 1.0000 | INHALATION_SPRAY | Freq: Four times a day (QID) | RESPIRATORY_TRACT | Status: DC | PRN

## 2019-10-25 MED ORDER — HYDROMORPHONE HCL 1 MG/ML IJ SOLN
0.2500 mg | INTRAMUSCULAR | Status: DC | PRN
  Administered 2019-10-25 (×3): 0.5 mg via INTRAVENOUS

## 2019-10-25 MED ORDER — TRAMADOL HCL 50 MG PO TABS
50.0000 mg | ORAL_TABLET | Freq: Four times a day (QID) | ORAL | Status: DC | PRN
  Administered 2019-10-25 – 2019-10-29 (×4): 50 mg via ORAL
  Filled 2019-10-25 (×4): qty 1

## 2019-10-25 MED ORDER — SODIUM CHLORIDE 0.9 % IV SOLN
2.0000 g | INTRAVENOUS | Status: AC
  Administered 2019-10-25: 09:00:00 2 g via INTRAVENOUS
  Filled 2019-10-25: qty 2

## 2019-10-25 MED ORDER — ACETAMINOPHEN 500 MG PO TABS
1000.0000 mg | ORAL_TABLET | Freq: Once | ORAL | Status: DC
  Filled 2019-10-25: qty 2

## 2019-10-25 MED ORDER — ALVIMOPAN 12 MG PO CAPS
12.0000 mg | ORAL_CAPSULE | ORAL | Status: AC
  Administered 2019-10-25: 12 mg via ORAL
  Filled 2019-10-25: qty 1

## 2019-10-25 MED ORDER — GABAPENTIN 300 MG PO CAPS
300.0000 mg | ORAL_CAPSULE | Freq: Two times a day (BID) | ORAL | Status: DC
  Administered 2019-10-25 – 2019-10-30 (×10): 300 mg via ORAL
  Filled 2019-10-25 (×10): qty 1

## 2019-10-25 MED ORDER — MIDAZOLAM HCL 2 MG/2ML IJ SOLN
INTRAMUSCULAR | Status: AC
  Filled 2019-10-25: qty 2

## 2019-10-25 MED ORDER — FENTANYL CITRATE (PF) 250 MCG/5ML IJ SOLN
INTRAMUSCULAR | Status: AC
  Filled 2019-10-25: qty 5

## 2019-10-25 MED ORDER — BUPIVACAINE LIPOSOME 1.3 % IJ SUSP
INTRAMUSCULAR | Status: DC | PRN
  Administered 2019-10-25: 20 mL

## 2019-10-25 MED ORDER — PROPOFOL 10 MG/ML IV BOLUS
INTRAVENOUS | Status: DC | PRN
  Administered 2019-10-25: 120 mg via INTRAVENOUS

## 2019-10-25 MED ORDER — KETAMINE HCL 10 MG/ML IJ SOLN
INTRAMUSCULAR | Status: AC
  Filled 2019-10-25: qty 1

## 2019-10-25 MED ORDER — HYDROMORPHONE HCL 1 MG/ML IJ SOLN
INTRAMUSCULAR | Status: AC
  Administered 2019-10-26: 0.5 mg via INTRAVENOUS
  Filled 2019-10-25: qty 1

## 2019-10-25 MED ORDER — PROPOFOL 10 MG/ML IV BOLUS
INTRAVENOUS | Status: AC
  Filled 2019-10-25: qty 20

## 2019-10-25 MED ORDER — ONDANSETRON HCL 4 MG/2ML IJ SOLN
INTRAMUSCULAR | Status: DC | PRN
  Administered 2019-10-25 (×2): 4 mg via INTRAVENOUS

## 2019-10-25 MED ORDER — 0.9 % SODIUM CHLORIDE (POUR BTL) OPTIME
TOPICAL | Status: DC | PRN
  Administered 2019-10-25: 2000 mL

## 2019-10-25 MED ORDER — HYDROMORPHONE HCL 1 MG/ML IJ SOLN
INTRAMUSCULAR | Status: AC
  Filled 2019-10-25: qty 1

## 2019-10-25 MED ORDER — ONDANSETRON HCL 4 MG/2ML IJ SOLN: 4.0000 mg | Freq: Four times a day (QID) | INTRAMUSCULAR | Status: DC | PRN

## 2019-10-25 MED ORDER — LACTATED RINGERS IV SOLN: INTRAVENOUS | Status: DC

## 2019-10-25 MED ORDER — SODIUM CHLORIDE 0.9 % IV SOLN
2.0000 g | Freq: Two times a day (BID) | INTRAVENOUS | Status: AC
  Administered 2019-10-25: 2 g via INTRAVENOUS
  Filled 2019-10-25: qty 2

## 2019-10-25 MED ORDER — LIDOCAINE 2% (20 MG/ML) 5 ML SYRINGE
INTRAMUSCULAR | Status: DC | PRN
  Administered 2019-10-25: 60 mg via INTRAVENOUS

## 2019-10-25 MED ORDER — ALBUTEROL SULFATE (2.5 MG/3ML) 0.083% IN NEBU: 2.5000 mg | INHALATION_SOLUTION | Freq: Four times a day (QID) | RESPIRATORY_TRACT | Status: DC | PRN

## 2019-10-25 MED ORDER — GABAPENTIN 300 MG PO CAPS
300.0000 mg | ORAL_CAPSULE | ORAL | Status: AC
  Administered 2019-10-25: 300 mg via ORAL
  Filled 2019-10-25: qty 1

## 2019-10-25 MED ORDER — PHENYLEPHRINE HCL-NACL 10-0.9 MG/250ML-% IV SOLN
INTRAVENOUS | Status: DC | PRN
  Administered 2019-10-25: 25 ug/min via INTRAVENOUS
  Administered 2019-10-25: 40 ug/min via INTRAVENOUS

## 2019-10-25 MED ORDER — KETAMINE HCL 10 MG/ML IJ SOLN
INTRAMUSCULAR | Status: DC | PRN
  Administered 2019-10-25: 30 mg via INTRAVENOUS

## 2019-10-25 MED ORDER — SUGAMMADEX SODIUM 500 MG/5ML IV SOLN
INTRAVENOUS | Status: DC | PRN
  Administered 2019-10-25: 300 mg via INTRAVENOUS

## 2019-10-25 MED ORDER — ALVIMOPAN 12 MG PO CAPS
12.0000 mg | ORAL_CAPSULE | Freq: Two times a day (BID) | ORAL | Status: DC
  Administered 2019-10-26 – 2019-10-28 (×5): 12 mg via ORAL
  Filled 2019-10-25 (×6): qty 1

## 2019-10-25 MED ORDER — BUPIVACAINE HCL (PF) 0.25 % IJ SOLN
INTRAMUSCULAR | Status: DC | PRN
  Administered 2019-10-25: 30 mL

## 2019-10-25 MED ORDER — CELECOXIB 200 MG PO CAPS
200.0000 mg | ORAL_CAPSULE | Freq: Once | ORAL | Status: AC
  Administered 2019-10-25: 200 mg via ORAL
  Filled 2019-10-25: qty 1

## 2019-10-25 MED ORDER — KCL IN DEXTROSE-NACL 20-5-0.45 MEQ/L-%-% IV SOLN
INTRAVENOUS | Status: DC
  Filled 2019-10-25 (×2): qty 1000

## 2019-10-25 MED ORDER — ACETAMINOPHEN 500 MG PO TABS
1000.0000 mg | ORAL_TABLET | Freq: Four times a day (QID) | ORAL | Status: DC
  Administered 2019-10-25 – 2019-10-30 (×15): 1000 mg via ORAL
  Filled 2019-10-25 (×15): qty 2

## 2019-10-25 MED ORDER — ACETAMINOPHEN 500 MG PO TABS
1000.0000 mg | ORAL_TABLET | ORAL | Status: AC
  Administered 2019-10-25: 1000 mg via ORAL

## 2019-10-25 MED ORDER — ENOXAPARIN SODIUM 40 MG/0.4ML ~~LOC~~ SOLN
40.0000 mg | SUBCUTANEOUS | Status: DC
  Administered 2019-10-26 – 2019-10-29 (×4): 40 mg via SUBCUTANEOUS
  Filled 2019-10-25 (×5): qty 0.4

## 2019-10-25 MED ORDER — DEXAMETHASONE SODIUM PHOSPHATE 10 MG/ML IJ SOLN
INTRAMUSCULAR | Status: DC | PRN
  Administered 2019-10-25: 6 mg via INTRAVENOUS

## 2019-10-25 MED ORDER — IOHEXOL 300 MG/ML  SOLN
INTRAMUSCULAR | Status: DC | PRN
  Administered 2019-10-25: 18 mL

## 2019-10-25 MED ORDER — LACTATED RINGERS IV SOLN: INTRAVENOUS | Status: DC | PRN

## 2019-10-25 MED ORDER — 0.9 % SODIUM CHLORIDE (POUR BTL) OPTIME
TOPICAL | Status: DC | PRN
  Administered 2019-10-25: 1000 mL

## 2019-10-25 MED ORDER — ALUM & MAG HYDROXIDE-SIMETH 200-200-20 MG/5ML PO SUSP
30.0000 mL | Freq: Four times a day (QID) | ORAL | Status: DC | PRN
  Administered 2019-10-28 (×2): 30 mL via ORAL
  Filled 2019-10-25 (×2): qty 30

## 2019-10-25 MED ORDER — ROCURONIUM BROMIDE 10 MG/ML (PF) SYRINGE
PREFILLED_SYRINGE | INTRAVENOUS | Status: DC | PRN
  Administered 2019-10-25 (×3): 20 mg via INTRAVENOUS
  Administered 2019-10-25: 60 mg via INTRAVENOUS

## 2019-10-25 MED ORDER — LIDOCAINE 20MG/ML (2%) 15 ML SYRINGE OPTIME
INTRAMUSCULAR | Status: DC | PRN
  Administered 2019-10-25: 1.5 mg/kg/h via INTRAVENOUS

## 2019-10-25 MED ORDER — FENTANYL CITRATE (PF) 250 MCG/5ML IJ SOLN
INTRAMUSCULAR | Status: DC | PRN
  Administered 2019-10-25: 50 ug via INTRAVENOUS
  Administered 2019-10-25: 150 ug via INTRAVENOUS
  Administered 2019-10-25: 100 ug via INTRAVENOUS

## 2019-10-25 MED ORDER — ONDANSETRON HCL 4 MG PO TABS: 4.0000 mg | ORAL_TABLET | Freq: Four times a day (QID) | ORAL | Status: DC | PRN

## 2019-10-25 MED ORDER — SODIUM CHLORIDE 0.9 % IR SOLN
Status: DC | PRN
  Administered 2019-10-25: 1000 mL

## 2019-10-25 SURGICAL SUPPLY — 110 items
ADAPTER GOLDBERG URETERAL (ADAPTER) ×3 IMPLANT
BAG URO CATCHER STRL LF (MISCELLANEOUS) ×3 IMPLANT
BASKET LASER NITINOL 1.9FR (BASKET) IMPLANT
BLADE EXTENDED COATED 6.5IN (ELECTRODE) IMPLANT
CANNULA REDUC XI 12-8 STAPL (CANNULA)
CANNULA REDUCER 12-8 DVNC XI (CANNULA) IMPLANT
CATH INTERMIT  6FR 70CM (CATHETERS) ×3 IMPLANT
CATH URET 5FR 28IN OPEN ENDED (CATHETERS) ×3 IMPLANT
CELLS DAT CNTRL 66122 CELL SVR (MISCELLANEOUS) IMPLANT
CLOTH BEACON ORANGE TIMEOUT ST (SAFETY) ×3 IMPLANT
COVER SURGICAL LIGHT HANDLE (MISCELLANEOUS) ×6 IMPLANT
COVER TIP SHEARS 8 DVNC (MISCELLANEOUS) ×2 IMPLANT
COVER TIP SHEARS 8MM DA VINCI (MISCELLANEOUS) ×1
COVER WAND RF STERILE (DRAPES) ×3 IMPLANT
DECANTER SPIKE VIAL GLASS SM (MISCELLANEOUS) IMPLANT
DERMABOND ADVANCED (GAUZE/BANDAGES/DRESSINGS) ×1
DERMABOND ADVANCED .7 DNX12 (GAUZE/BANDAGES/DRESSINGS) ×2 IMPLANT
DRAIN CHANNEL 19F RND (DRAIN) IMPLANT
DRAPE ARM DVNC X/XI (DISPOSABLE) ×8 IMPLANT
DRAPE COLUMN DVNC XI (DISPOSABLE) ×2 IMPLANT
DRAPE DA VINCI XI ARM (DISPOSABLE) ×4
DRAPE DA VINCI XI COLUMN (DISPOSABLE) ×1
DRAPE SURG IRRIG POUCH 19X23 (DRAPES) ×3 IMPLANT
DRSG OPSITE POSTOP 4X10 (GAUZE/BANDAGES/DRESSINGS) IMPLANT
DRSG OPSITE POSTOP 4X6 (GAUZE/BANDAGES/DRESSINGS) ×3 IMPLANT
DRSG OPSITE POSTOP 4X8 (GAUZE/BANDAGES/DRESSINGS) IMPLANT
ELECT PENCIL ROCKER SW 15FT (MISCELLANEOUS) ×3 IMPLANT
ELECT REM PT RETURN 15FT ADLT (MISCELLANEOUS) ×3 IMPLANT
ENDOLOOP SUT PDS II  0 18 (SUTURE)
ENDOLOOP SUT PDS II 0 18 (SUTURE) IMPLANT
EVACUATOR SILICONE 100CC (DRAIN) IMPLANT
EXTRACTOR STONE 1.7FRX115CM (UROLOGICAL SUPPLIES) IMPLANT
FIBER LASER FLEXIVA 1000 (UROLOGICAL SUPPLIES) IMPLANT
FIBER LASER FLEXIVA 365 (UROLOGICAL SUPPLIES) IMPLANT
FIBER LASER FLEXIVA 550 (UROLOGICAL SUPPLIES) IMPLANT
FIBER LASER TRAC TIP (UROLOGICAL SUPPLIES) IMPLANT
GLOVE BIO SURGEON STRL SZ 6.5 (GLOVE) ×6 IMPLANT
GLOVE BIOGEL M STRL SZ7.5 (GLOVE) ×3 IMPLANT
GLOVE BIOGEL PI IND STRL 7.0 (GLOVE) ×4 IMPLANT
GLOVE BIOGEL PI INDICATOR 7.0 (GLOVE) ×2
GOWN STRL REUS W/TWL LRG LVL3 (GOWN DISPOSABLE) ×3 IMPLANT
GOWN STRL REUS W/TWL XL LVL3 (GOWN DISPOSABLE) ×6 IMPLANT
GRASPER SUT TROCAR 14GX15 (MISCELLANEOUS) IMPLANT
GUIDEWIRE ANG ZIPWIRE 038X150 (WIRE) ×3 IMPLANT
GUIDEWIRE STR DUAL SENSOR (WIRE) ×3 IMPLANT
HOLDER FOLEY CATH W/STRAP (MISCELLANEOUS) ×3 IMPLANT
IRRIG SUCT STRYKERFLOW 2 WTIP (MISCELLANEOUS) ×3
IRRIGATION SUCT STRKRFLW 2 WTP (MISCELLANEOUS) ×2 IMPLANT
IRRIGATOR SUCT 8 DISP DVNC XI (IRRIGATION / IRRIGATOR) IMPLANT
IRRIGATOR SUCTION 8MM XI DISP (IRRIGATION / IRRIGATOR)
KIT PROCEDURE DA VINCI SI (MISCELLANEOUS) ×1
KIT PROCEDURE DVNC SI (MISCELLANEOUS) ×2 IMPLANT
KIT TURNOVER KIT A (KITS) ×3 IMPLANT
MANIFOLD NEPTUNE II (INSTRUMENTS) ×3 IMPLANT
NEEDLE INSUFFLATION 14GA 120MM (NEEDLE) ×3 IMPLANT
PACK CARDIOVASCULAR III (CUSTOM PROCEDURE TRAY) ×3 IMPLANT
PAD POSITIONING PINK XL (MISCELLANEOUS) ×3 IMPLANT
PORT LAP GEL ALEXIS MED 5-9CM (MISCELLANEOUS) IMPLANT
RTRCTR WOUND ALEXIS 18CM MED (MISCELLANEOUS)
SCISSORS LAP 5X35 DISP (ENDOMECHANICALS) IMPLANT
SEAL CANN UNIV 5-8 DVNC XI (MISCELLANEOUS) ×8 IMPLANT
SEAL XI 5MM-8MM UNIVERSAL (MISCELLANEOUS) ×4
SEALER VESSEL DA VINCI XI (MISCELLANEOUS) ×1
SEALER VESSEL EXT DVNC XI (MISCELLANEOUS) ×2 IMPLANT
SHEATH URETERAL 12FRX28CM (UROLOGICAL SUPPLIES) IMPLANT
SHEATH URETERAL 12FRX35CM (MISCELLANEOUS) IMPLANT
SOLUTION ELECTROLUBE (MISCELLANEOUS) ×3 IMPLANT
STAPLER 45 BLU RELOAD XI (STAPLE) IMPLANT
STAPLER 45 BLUE RELOAD XI (STAPLE)
STAPLER 45 DA VINCI SURE FORM (STAPLE)
STAPLER 45 GREEN RELOAD XI (STAPLE)
STAPLER 45 GRN RELOAD XI (STAPLE) IMPLANT
STAPLER 45 SUREFORM DVNC (STAPLE) IMPLANT
STAPLER 60 DA VINCI SURE FORM (STAPLE)
STAPLER 60 SUREFORM DVNC (STAPLE) IMPLANT
STAPLER CANNULA SEAL DVNC XI (STAPLE) ×2 IMPLANT
STAPLER CANNULA SEAL XI (STAPLE) ×1
STAPLER ECHELON POWER CIR 29 (STAPLE) ×3 IMPLANT
STAPLER VISISTAT 35W (STAPLE) IMPLANT
STOPCOCK 4 WAY LG BORE MALE ST (IV SETS) ×6 IMPLANT
SUT ETHILON 2 0 PS N (SUTURE) IMPLANT
SUT NOVA NAB DX-16 0-1 5-0 T12 (SUTURE) ×6 IMPLANT
SUT PROLENE 2 0 KS (SUTURE) ×3 IMPLANT
SUT SILK 2 0 (SUTURE) ×1
SUT SILK 2 0 SH CR/8 (SUTURE) IMPLANT
SUT SILK 2-0 18XBRD TIE 12 (SUTURE) ×2 IMPLANT
SUT SILK 3 0 (SUTURE)
SUT SILK 3 0 SH CR/8 (SUTURE) ×3 IMPLANT
SUT SILK 3-0 18XBRD TIE 12 (SUTURE) IMPLANT
SUT V-LOC BARB 180 2/0GR6 GS22 (SUTURE)
SUT VIC AB 2-0 SH 18 (SUTURE) IMPLANT
SUT VIC AB 2-0 SH 27 (SUTURE)
SUT VIC AB 2-0 SH 27X BRD (SUTURE) IMPLANT
SUT VIC AB 3-0 SH 18 (SUTURE) IMPLANT
SUT VIC AB 4-0 PS2 27 (SUTURE) ×6 IMPLANT
SUT VICRYL 0 UR6 27IN ABS (SUTURE) ×3 IMPLANT
SUTURE V-LC BRB 180 2/0GR6GS22 (SUTURE) IMPLANT
SYR 10ML ECCENTRIC (SYRINGE) ×3 IMPLANT
SYS LAPSCP GELPORT 120MM (MISCELLANEOUS)
SYSTEM LAPSCP GELPORT 120MM (MISCELLANEOUS) IMPLANT
TOWEL OR 17X26 10 PK STRL BLUE (TOWEL DISPOSABLE) IMPLANT
TOWEL OR NON WOVEN STRL DISP B (DISPOSABLE) ×3 IMPLANT
TRAY COLON PACK (CUSTOM PROCEDURE TRAY) ×3 IMPLANT
TRAY CYSTO PACK (CUSTOM PROCEDURE TRAY) ×3 IMPLANT
TRAY FOLEY MTR SLVR 16FR STAT (SET/KITS/TRAYS/PACK) IMPLANT
TROCAR ADV FIXATION 5X100MM (TROCAR) ×3 IMPLANT
TUBE FEEDING 8FR 16IN STR KANG (MISCELLANEOUS) IMPLANT
TUBING CONNECTING 10 (TUBING) ×9 IMPLANT
TUBING INSUFFLATION 10FT LAP (TUBING) ×3 IMPLANT
TUBING UROLOGY SET (TUBING) ×3 IMPLANT

## 2019-10-25 NOTE — Anesthesia Procedure Notes (Signed)
Date/Time: 10/25/2019 11:16 AM Performed by: Minerva Ends, CRNA Oxygen Delivery Method: Simple face mask Placement Confirmation: positive ETCO2 and breath sounds checked- equal and bilateral Dental Injury: Teeth and Oropharynx as per pre-operative assessment

## 2019-10-25 NOTE — Transfer of Care (Signed)
Immediate Anesthesia Transfer of Care Note  Patient: Jacob Graham  Procedure(s) Performed: XI ROBOT ASSISTED SIGMOIDECTOMY (N/A Abdomen) CYSTOSCOPY WITH BILATERAL RETROGRADE, FIREFLY INJECTION  AND URETERAL CATHETER PLACEMENT (Bilateral Ureter)  Patient Location: PACU  Anesthesia Type:General  Level of Consciousness: sedated  Airway & Oxygen Therapy: Patient Spontanous Breathing and Patient connected to face mask oxygen  Post-op Assessment: Report given to RN and Post -op Vital signs reviewed and stable  Post vital signs: Reviewed and stable  Last Vitals:  Vitals Value Taken Time  BP 147/91 10/25/19 1125  Temp    Pulse 61 10/25/19 1126  Resp 10 10/25/19 1126  SpO2 100 % 10/25/19 1126  Vitals shown include unvalidated device data.  Last Pain:  Vitals:   10/25/19 0708  TempSrc: Oral  PainSc:          Complications: No apparent anesthesia complications

## 2019-10-25 NOTE — Anesthesia Procedure Notes (Signed)
Procedure Name: Intubation Date/Time: 10/25/2019 8:37 AM Performed by: Minerva Ends, CRNA Pre-anesthesia Checklist: Patient identified, Emergency Drugs available, Suction available and Patient being monitored Patient Re-evaluated:Patient Re-evaluated prior to induction Oxygen Delivery Method: Circle System Utilized Preoxygenation: Pre-oxygenation with 100% oxygen Induction Type: IV induction and Cricoid Pressure applied Ventilation: Mask ventilation without difficulty Laryngoscope Size: Miller and 2 Grade View: Grade II Tube type: Oral Tube size: 7.5 mm Number of attempts: 1 Airway Equipment and Method: Stylet Placement Confirmation: ETT inserted through vocal cords under direct vision,  positive ETCO2 and breath sounds checked- equal and bilateral Secured at: 23 cm Tube secured with: Tape Dental Injury: Teeth and Oropharynx as per pre-operative assessment  Comments: Smooth IV induction Fitzgerald-- intubation AM CRNA atraumatic-- teeth and mouth as preop-- bilat BS Sampson Goon

## 2019-10-25 NOTE — Brief Op Note (Signed)
10/25/2019  4:43 PM  PATIENT:  Jacob Graham  49 y.o. male  PRE-OPERATIVE DIAGNOSIS:  DIVERTICULAR DISEASE  POST-OPERATIVE DIAGNOSIS:  DIVERTICULAR DISEASE  PROCEDURE:  Cystoscopy with bilateral retrogrades and temprary stent placement  SURGEON:   Sebastian Ache, MD - Primary  PHYSICIAN ASSISTANT:   ASSISTANTS: none   ANESTHESIA:   general  EBL:  25 mL   BLOOD ADMINISTERED:none  DRAINS: Foley + RT / LT ureteral stents to gravity   LOCAL MEDICATIONS USED:  NONE  SPECIMEN:  No Specimen  DISPOSITION OF SPECIMEN:  N/A  COUNTS:  YES  TOURNIQUET:  * No tourniquets in log *  DICTATION: .Other Dictation: Dictation Number [id number]  PLAN OF CARE: remain in OR  PATIENT DISPOSITION:  remain in OR   Delay start of Pharmacological VTE agent (>24hrs) due to surgical blood loss or risk of bleeding: not applicable

## 2019-10-25 NOTE — Anesthesia Postprocedure Evaluation (Signed)
Anesthesia Post Note  Patient: Jacob Graham  Procedure(s) Performed: XI ROBOT ASSISTED SIGMOIDECTOMY (N/A Abdomen) CYSTOSCOPY WITH BILATERAL RETROGRADE, FIREFLY INJECTION  AND URETERAL CATHETER PLACEMENT (Bilateral Ureter)     Patient location during evaluation: PACU Anesthesia Type: General Level of consciousness: awake and alert Pain management: pain level controlled Vital Signs Assessment: post-procedure vital signs reviewed and stable Respiratory status: spontaneous breathing, nonlabored ventilation, respiratory function stable and patient connected to nasal cannula oxygen Cardiovascular status: blood pressure returned to baseline and stable Postop Assessment: no apparent nausea or vomiting Anesthetic complications: no    Last Vitals:  Vitals:   10/25/19 1300 10/25/19 1315  BP: (!) 147/92 (!) 144/90  Pulse: 64 64  Resp: 14 13  Temp:  36.9 C  SpO2: 100% 100%    Last Pain:  Vitals:   10/25/19 1315  TempSrc:   PainSc: 4                  Viera Okonski,W. EDMOND

## 2019-10-25 NOTE — H&P (Signed)
   The patient is a 49 year old male who presents with diverticulitis. 49 year old male who presents to the office with recurrent diverticulitis since October 2020. He has been on antibiotics several times. CT scan show microperforation with no sign of abscess or fistula. He is scheduled to undergo a colonoscopy in late January 2021. His last colonoscopy was greater than 5 years ago. He reports ongoing left lower quadrant pain, but denies any fevers. He is currently eating a low fiber diet to help with the symptoms. Records from his GI doctor were reviewed   Problem List/Past Medical  ANAL FISTULA (K60.3)  DIVERTICULITIS, COLON (K57.32)   Past Surgical History  Colon Polyp Removal - Colonoscopy  FISTULECTOMY, ANAL, TRANSSPHINCTERIC (33295)  Appendectomy   Diagnostic Studies History  Colonoscopy  1-5 years ago  Allergies  No Known Drug Allergies [07/25/2015]: Allergies Reconciled   Medication History  Amoxicillin-Pot Clavulanate (875-125MG  Tablet, Oral) Active. Medications Reconciled Specialty Vitamins Products ER (Oral) Active. Albuterol Sulfate (108 (90 Base)MCG/ACT Aero Pow Br Act, Inhalation) Active. Metamucil (58.6% Powder, Oral) Active. ValACYclovir HCl (1GM Tablet, Oral) Active.  Social History  Alcohol use  Occasional alcohol use. Tobacco use  Former smoker. Caffeine use  Coffee, Tea. No drug use   Family History  Alcohol Abuse  Father, Mother. Thyroid problems  Mother. Seizure disorder  Mother. Hypertension  Mother.  Other Problems Other disease, cancer, significant illness  Arthritis  Asthma   BP (!) 146/97   Pulse 86   Temp 98.6 F (37 C) (Oral)   Resp 18   Ht 5\' 6"  (1.676 m)   Wt 95.7 kg   SpO2 94%   BMI 34.06 kg/m     Physical Exam  General Mental Status-Alert. General Appearance-Cooperative. CV: RRR Lungs:CTA Abdomen Palpation/Percussion Palpation and Percussion of the abdomen reveal - Soft. Tenderness -  Left Lower Quadrant(mild). Note: suprapubic.    Assessment & Plan  DIVERTICULITIS, COLON (K57.32) Impression: 49 year old male with diverticular disease. This appears to be smoldering. He has undergone colonoscopy.  The surgery and anatomy were described to the patient as well as the risks of surgery and the possible complications.  These include: Bleeding, deep abdominal infections and possible wound complications such as hernia and infection, damage to adjacent structures, leak of surgical connections, which can lead to other surgeries and possibly an ostomy, possible need for other procedures, such as abscess drains in radiology, possible prolonged hospital stay, possible diarrhea from removal of part of the colon, possible constipation from narcotics, prolonged fatigue/weakness or appetite loss, possible early recurrence of of disease, possible complications of their medical problems such as heart disease or arrhythmias or lung problems, death (less than 1%). I believe the patient understands and wishes to proceed with the surgery.

## 2019-10-25 NOTE — Progress Notes (Signed)
Patient complains that his testicles feel like they are in a vice, and this happened the last time he had "contrast". No swelling or discoloration noted to testicles.  Dr. Berneice Heinrich paged and informed, stated" It will pass, you can use ice"  Patient provided with ice pack

## 2019-10-25 NOTE — Op Note (Signed)
10/25/2019  11:03 AM  PATIENT:  Jacob Graham  49 y.o. male  Patient Care Team: [facility], [facility] as PCP - General  PRE-OPERATIVE DIAGNOSIS:  DIVERTICULAR DISEASE  POST-OPERATIVE DIAGNOSIS:  DIVERTICULAR DISEASE  PROCEDURE:  XI ROBOT ASSISTED SIGMOIDECTOMY CYSTOSCOPY WITH BILATERAL RETROGRADE, FIREFLY INJECTION  AND URETERAL CATHETER PLACEMENT   Surgeon(s): Romie Levee, MD Sebastian Ache, MD Hedda Slade, PA-C  ASSISTANT: Hedda Slade, PA-C   ANESTHESIA:   local and general  EBL: 43ml Total I/O In: 1000 [I.V.:1000] Out: 55 [Urine:30; Blood:25]  Delay start of Pharmacological VTE agent (>24hrs) due to surgical blood loss or risk of bleeding:  no  DRAINS: none   SPECIMEN:  Source of Specimen:  Sigmoid colon  DISPOSITION OF SPECIMEN:  PATHOLOGY  COUNTS:  YES  PLAN OF CARE: Admit to inpatient   PATIENT DISPOSITION:  PACU - hemodynamically stable.  INDICATION:    49 y.o. M with recurrent complicated diverticulitis.  I recommended segmental resection:  The anatomy & physiology of the digestive tract was discussed.  The pathophysiology was discussed.  Natural history risks without surgery was discussed.   I worked to give an overview of the disease and the frequent need to have multispecialty involvement.  I feel the risks of no intervention will lead to serious problems that outweigh the operative risks; therefore, I recommended a partial colectomy to remove the pathology.  Laparoscopic & open techniques were discussed.   Risks such as bleeding, infection, abscess, leak, reoperation, possible ostomy, hernia, heart attack, death, and other risks were discussed.  I noted a good likelihood this will help address the problem.   Goals of post-operative recovery were discussed as well.    The patient expressed understanding & wished to proceed with surgery.  OR FINDINGS:   Patient had distal sigmoid diverticular disease  The anastomosis rests 12 cm from the anal  verge by rigid proctoscopy.  DESCRIPTION:   Informed consent was confirmed.  The patient underwent general anaesthesia without difficulty.  The patient was positioned appropriately.  VTE prevention in place.  The patient's abdomen was clipped, prepped, & draped in a sterile fashion.  Surgical timeout confirmed our plan.  The patient was positioned in reverse Trendelenburg.  Abdominal entry was gained using a Varies needle in the LUQ.  Entry was clean.  I induced carbon dioxide insufflation.  An 44mm robotic port was placed in the RUQ.  Camera inspection revealed no injury.  Extra ports were carefully placed under direct laparoscopic visualization.  I laparoscopically reflected the greater omentum and the upper abdomen the small bowel in the upper abdomen. The patient was appropriately positioned and the robot was docked to the patient's left side.  Instruments were placed under direct visualization.    I mobilized the sigmoid colon off of the pelvic sidewall.  I scored the base of peritoneum of the right side of the mesentery of the left colon from the ligament of Treitz to the peritoneal reflection of the mid rectum.  The patient had significant inflammation at the L pelvic brim.  I elevated the sigmoid mesentery and enetered into the retro-mesenteric plane. We were able to identify the left ureter and gonadal vessels. We kept those posterior within the retroperitoneum and elevated the left colon mesentery off that. I did isolated IMA pedicle but did not ligate it yet.  I continued distally and got into the avascular plane posterior to the mesorectum. This allowed me to help mobilize the rectum as well by freeing the mesorectum  off the sacrum.  I mobilized the peritoneal coverings towards the peritoneal reflection on both the right and left sides of the rectum.  I could see the right and left ureters and stayed away from them.   I skeletonized the inferior mesenteric artery pedicle.  After confirming the  left ureter was out of the way, I went ahead and ligated the inferior mesenteric artery pedicle with bipolar robotic vessel sealer well above its takeoff from the aorta.  We ensured hemostasis. I skeletonized the mesorectum at the junction at the proximal rectum using blunt dissection & bipolar robotic vessel sealer.  I mobilized the left colon in a lateral to medial fashion off the line of Toldt up towards the splenic flexure to ensure good mobilization of the left colon to reach into the pelvis.  I divided the mesentery at the rectosigmoid junction using a robotic vessel sealer.  Hemostasis was good.  I then divided the remaining sigmoid colon mesentery from the level of the vessels up to the sigmoid, descending junction using the robotic vessel sealer.  We then placed a 12 mm suprapubic port and a green robotic stapler was introduced into this port.  The rectosigmoid junction was divided using the stapler.  I evaluated the abdomen.  The ureters were intact.  Hemostasis was good.  The robot instruments were removed and the robot was undocked.  I then enlarged my 12 mm port to a Pfannenstiel incision.  An Brenas wound protector was placed.  The colon was removed from the wound protector and divided proximally over a pursestring device.  A 2-0 Prolene pursestring was placed through the device and secured with 3-0 silk sutures.  A 29 mm EEA anvil was then placed into the colon and the pursestring was tied tightly around this.  This was then placed back into the abdomen.  The specimen was sent to pathology for further examination.  The abdomen was then reinsufflated.  A 29 mm EEA stapler was inserted into the rectum and brought out through the distal rectal stump.  An anastomosis was created.  There was no tension.  There was no leak when tested with insufflation under irrigation.  The anastomosis rest approximately 12 cm from anal verge.  I evaluated all 4 quadrants of the abdomen once again.  There was no sign of  active bleeding or injury.  The abdomen was desufflated and the ports were removed. We then switched to clean gowns, gloves, instruments and drapes.  The peritoneum of the Pfannenstiel incision was then closed using a running 2-0 Vicryl suture.  The fascia was then closed using interrupted #1 Novafil sutures.  Subcutaneous tissues were reapproximated using a running 2-0 Vicryl suture.  The port sites were closed using a 4-0 Vicryl subcuticular suture and Dermabond.  The Pfannenstiel incision was closed using a running 4-0 Vicryl suture.  A sterile dressing was applied.  The patient was then awakened from anesthesia and sent to the postanesthesia care unit in stable condition.  All counts were correct per operating room staff.  An MD/PA assistant was necessary for tissue manipulation, retraction and positioning due to the complexity of the case and hospital policies.

## 2019-10-25 NOTE — H&P (Signed)
Jacob Graham is an 49 y.o. male.    Chief Complaint: Pre-OP cystoscopy with BILATERAL Retrogrades / Stent placement  HPI:   1 - Diverticulitis - Undergoing colon resection by general surgery team today who has reqeusted peri-op stenting to aid in identification and protection of ureters. CT 06/2019 reviewed, no overt direct GU invovlment.    Past Medical History:  Diagnosis Date  . Asthma   . Diverticulitis   . IBS (irritable bowel syndrome)   . Rectal bleeding   . Wears glasses     Past Surgical History:  Procedure Laterality Date  . APPENDECTOMY  1999  . colonoscopy  2008, 2014  . EVALUATION UNDER ANESTHESIA WITH FISTULECTOMY N/A 10/09/2015   Procedure: EXAM UNDER ANESTHESIA; FISTULOTOMY;  Surgeon: Romie Levee, MD;  Location: [facility];  Service: General;  Laterality: N/A;  . KNEE SURGERY  1993    Family History  Problem Relation Age of Onset  . Seizures Mother        partial complex   . Cancer Paternal Grandfather        Colon  . Colon cancer Paternal Grandfather 58   Social History:  reports that he quit smoking about 15 years ago. His smoking use included cigarettes. He has quit using smokeless tobacco. He reports current alcohol use of about 2.0 standard drinks of alcohol per week. He reports that he does not use drugs.  Allergies:  Allergies  Allergen Reactions  . Banana Anaphylaxis  . Iodinated Diagnostic Agents Other (See Comments)    "Pain in testicles and burning in kidneys"    No medications prior to admission.    No results found for this or any previous visit (from the past 48 hour(s)). No results found.  Review of Systems  Constitutional: Negative for fever.  All other systems reviewed and are negative.   There were no vitals taken for this visit. Physical Exam  Constitutional: He appears well-developed.  HENT:  Head: Normocephalic.  Cardiovascular: Normal rate.  Respiratory: Effort normal.  GI: Soft.   Genitourinary:    Genitourinary Comments: No CVAT   Musculoskeletal:        General: Normal range of motion.     Cervical back: Normal range of motion.  Neurological: He is alert.  Skin: Skin is warm.     Assessment/Plan  Proceed with cysto, bilateral retrogrades, bilateral open-ended stent placement as part of diverticulitis surgyer today. Risks, benefits, alternatives, expected peri-op course discussed.   Sebastian Ache, MD 10/25/2019, 5:25 AM

## 2019-10-26 LAB — BASIC METABOLIC PANEL
Anion gap: 8 (ref 5–15)
BUN: 9 mg/dL (ref 6–20)
CO2: 25 mmol/L (ref 22–32)
Calcium: 8.2 mg/dL — ABNORMAL LOW (ref 8.9–10.3)
Chloride: 101 mmol/L (ref 98–111)
Creatinine, Ser: 0.72 mg/dL (ref 0.61–1.24)
GFR calc Af Amer: >60 mL/min (ref >60)
GFR calc non Af Amer: >60 mL/min (ref >60)
Glucose, Bld: 114 mg/dL — ABNORMAL HIGH (ref 70–99)
Potassium: 4.2 mmol/L (ref 3.5–5.1)
Sodium: 134 mmol/L — ABNORMAL LOW (ref 135–145)

## 2019-10-26 LAB — CBC
HCT: 40.5 % (ref 39.0–52.0)
Hemoglobin: 13.4 g/dL (ref 13.0–17.0)
MCH: 31 pg (ref 26.0–34.0)
MCHC: 33.1 g/dL (ref 30.0–36.0)
MCV: 93.8 fL (ref 80.0–100.0)
Platelets: 226 10*3/uL (ref 150–400)
RBC: 4.32 MIL/uL (ref 4.22–5.81)
RDW: 13.3 % (ref 11.5–15.5)
WBC: 9.8 10*3/uL (ref 4.0–10.5)
nRBC: 0 % (ref 0.0–0.2)

## 2019-10-26 MED ORDER — CHLORHEXIDINE GLUCONATE CLOTH 2 % EX PADS
6.0000 | MEDICATED_PAD | Freq: Every day | CUTANEOUS | Status: DC
  Administered 2019-10-26 – 2019-10-27 (×2): 6 via TOPICAL

## 2019-10-26 NOTE — Op Note (Signed)
NAME: Jacob Graham, Jacob Graham MEDICAL RECORD PX:[id number] ACCOUNT [id number] DATE OF BIRTH:02/28/1971 FACILITY: [facility] LOCATION: Mary Sella, MD  OPERATIVE REPORT  DATE OF PROCEDURE:  10/25/2019  SURGEON:  Sebastian Ache, MD  PREOPERATIVE DIAGNOSIS:  Diverticulitis.  PROCEDURE: 1.  Cystoscopy, bilateral retrograde pyelograms, interpretation. 2.  Insertion of bilateral ureteral stents.  ESTIMATED BLOOD LOSS:  Nil.  MEDICATIONS:  None.  SPECIMENS:  None.  FINDINGS: 1.  Unremarkable bilateral retrograde pyelograms. 2.  Successful placement of bilateral ureteral stents, right 6-French green in color, left 5-French yellow in color.   3.  Greenwald adapter.  INDICATIONS:  The patient is a 49 year old man with history of recurrent diverticulitis, including a smoldering case.  He is undergoing sigmoid resection under the care of general surgery team.  Given the significant infectious inflammatory response, the  general surgery team has requested perioperative stenting with retrograde pyelogram and ICG dye tattooing of the ureters to help identify the ureters intraoperatively.  Informed consent was obtained and placed in the medical record.  DESCRIPTION OF PROCEDURE:  The patient being identified, the procedure being cystoscopy, bilateral retrogrades and stent placement confirmed.  Procedure timeout was performed.  Intravenous antibiotics were administered.  General endotracheal anesthesia  induced.  The patient was placed into a low lithotomy position.  A sterile field was created, prepping and draping his penis, perineum and proximal thighs using iodine.  Cystourethroscopy was performed using 21-French rigid cystoscope with offset lens.   Inspection of anterior and posterior urethra was unremarkable.  The left ureteral orifice was cannulated with a 5-French open-ended catheter and left retrograde pyelogram was obtained.  A50/50 slurry of ICG and Omnipaque was used to  perform very gentle left retrograde pyelogram to the level of the kidney and the open-ended catheter was advanced to the level of  the upper pole and then set aside.  Similarly, a right retrograde pyelogram was obtained using a slurry of contrast and ICG dye.  A single right ureter, single system right kidney was unremarkable.  Again, exquisite care was taken to avoid pyelonisus which  did not obviously occur and the right-sided green stent was advanced to level of the upper pole.  A new 16-French Foley catheter was placed free to straight drain, 10 mL around the balloon and the right ureter, left ureter, Foley catheter were then all  connected to 3-way a Greenwald adapter connected to  straight drain.  The procedure was then terminated.  The patient tolerated the procedure well.  No immediate perioperative complications.  The patient remained in the OR for the general surgery portion of the  procedure today.  Please note, the patient does have a reported sensitivity to contrast, including genital pain.  This was quite unusual.  He does not have a history of flushing, tongue swelling, trouble breathing or any typical contrast allergy.  As the goal today was to  tattoo the ureters completely, it was felt that dilute contrast would be safe.  We discussed this preoperatively.  VN/NUANCE  D:10/25/2019 T:10/25/2019 JOB:[id number]

## 2019-10-26 NOTE — Progress Notes (Signed)
1 Day Post-Op robotic Sigmoidectomy Subjective: Doing well.  Pain controlled.  Tolerating fulls  Objective: Vital signs in last 24 hours: Temp:  [97 F (36.1 C)-99.1 F (37.3 C)] 98.4 F (36.9 C) (04/22 0602) Pulse Rate:  [61-99] 78 (04/22 0602) Resp:  [9-18] 18 (04/22 0602) BP: (107-155)/(72-97) 110/72 (04/22 0602) SpO2:  [99 %-100 %] 100 % (04/22 0602)   Intake/Output from previous day: 04/21 0701 - 04/22 0700 In: 3173.4 [P.O.:710; I.V.:2463.4] Out: 2955 [Urine:2930; Blood:25] Intake/Output this shift: No intake/output data recorded.   General appearance: alert and cooperative GI: normal findings: soft, non-tender  Incision: no significant drainage  Lab Results:  Recent Labs    10/26/19 0419  WBC 9.8  HGB 13.4  HCT 40.5  PLT 226   BMET Recent Labs    10/26/19 0419  NA 134*  K 4.2  CL 101  CO2 25  GLUCOSE 114*  BUN 9  CREATININE 0.72  CALCIUM 8.2*   PT/INR No results for input(s): LABPROT, INR in the last 72 hours. ABG No results for input(s): PHART, HCO3 in the last 72 hours.  Invalid input(s): PCO2, PO2  MEDS, Scheduled . acetaminophen  1,000 mg Oral Q6H  . alvimopan  12 mg Oral BID  . Chlorhexidine Gluconate Cloth  6 each Topical Daily  . enoxaparin (LOVENOX) injection  40 mg Subcutaneous Q24H  . feeding supplement  237 mL Oral BID BM  . gabapentin  300 mg Oral BID  . saccharomyces boulardii  250 mg Oral BID    Studies/Results: DG C-Arm 1-60 Min-No Report  Result Date: 10/25/2019 Fluoroscopy was utilized by the requesting physician.  No radiographic interpretation.    Assessment: s/p Procedure(s): XI ROBOT ASSISTED SIGMOIDECTOMY CYSTOSCOPY WITH BILATERAL RETROGRADE, FIREFLY INJECTION  AND URETERAL CATHETER PLACEMENT Patient Active Problem List   Diagnosis Date Noted  . Diverticular disease 10/25/2019  . Wears glasses   . IBS (irritable bowel syndrome)   . Rectal bleeding   . Diverticulitis   . Asthma     Expected post op  course  Plan: d/c foley Advance diet to soft foods Ambulate SL IVF's   LOS: 1 day     .Vanita Panda, MD [facility], [place]    10/26/2019 7:42 AM

## 2019-10-27 LAB — CBC
HCT: 37.3 % — ABNORMAL LOW (ref 39.0–52.0)
Hemoglobin: 12.2 g/dL — ABNORMAL LOW (ref 13.0–17.0)
MCH: 30.7 pg (ref 26.0–34.0)
MCHC: 32.7 g/dL (ref 30.0–36.0)
MCV: 93.7 fL (ref 80.0–100.0)
Platelets: 186 10*3/uL (ref 150–400)
RBC: 3.98 MIL/uL — ABNORMAL LOW (ref 4.22–5.81)
RDW: 13.3 % (ref 11.5–15.5)
WBC: 5.8 10*3/uL (ref 4.0–10.5)
nRBC: 0 % (ref 0.0–0.2)

## 2019-10-27 LAB — BASIC METABOLIC PANEL
Anion gap: 7 (ref 5–15)
BUN: 9 mg/dL (ref 6–20)
CO2: 27 mmol/L (ref 22–32)
Calcium: 8.5 mg/dL — ABNORMAL LOW (ref 8.9–10.3)
Chloride: 106 mmol/L (ref 98–111)
Creatinine, Ser: 0.73 mg/dL (ref 0.61–1.24)
GFR calc Af Amer: >60 mL/min (ref >60)
GFR calc non Af Amer: >60 mL/min (ref >60)
Glucose, Bld: 93 mg/dL (ref 70–99)
Potassium: 4 mmol/L (ref 3.5–5.1)
Sodium: 140 mmol/L (ref 135–145)

## 2019-10-27 LAB — SURGICAL PATHOLOGY

## 2019-10-27 MED ORDER — TRAMADOL HCL 50 MG PO TABS: 50.0000 mg | ORAL_TABLET | Freq: Four times a day (QID) | ORAL | 0 refills | Status: DC | PRN

## 2019-10-27 NOTE — Progress Notes (Signed)
2 Days Post-Op robotic Sigmoidectomy Subjective: Doing well.  Incisional pain controlled.  C/o testicular pain, but this has improved since dos. Tolerating soft diet  Objective: Vital signs in last 24 hours: Temp:  [97.7 F (36.5 C)-98.7 F (37.1 C)] 98.6 F (37 C) (04/23 0355) Pulse Rate:  [66-75] 74 (04/23 0355) Resp:  [16-18] 18 (04/23 0355) BP: (123-131)/(79-92) 125/89 (04/23 0355) SpO2:  [96 %-100 %] 96 % (04/23 0355) Weight:  [96.7 kg] 96.7 kg (04/23 0452)   Intake/Output from previous day: 04/22 0701 - 04/23 0700 In: 480 [P.O.:480] Out: 3125 [Urine:3125] Intake/Output this shift: No intake/output data recorded.   General appearance: alert and cooperative GI: normal findings: soft, non-tender  Incision: no significant drainage  Lab Results:  Recent Labs    10/26/19 0419 10/27/19 0352  WBC 9.8 5.8  HGB 13.4 12.2*  HCT 40.5 37.3*  PLT 226 186   BMET Recent Labs    10/26/19 0419 10/27/19 0352  NA 134* 140  K 4.2 4.0  CL 101 106  CO2 25 27  GLUCOSE 114* 93  BUN 9 9  CREATININE 0.72 0.73  CALCIUM 8.2* 8.5*   PT/INR No results for input(s): LABPROT, INR in the last 72 hours. ABG No results for input(s): PHART, HCO3 in the last 72 hours.  Invalid input(s): PCO2, PO2  MEDS, Scheduled . acetaminophen  1,000 mg Oral Q6H  . alvimopan  12 mg Oral BID  . Chlorhexidine Gluconate Cloth  6 each Topical Daily  . enoxaparin (LOVENOX) injection  40 mg Subcutaneous Q24H  . feeding supplement  237 mL Oral BID BM  . gabapentin  300 mg Oral BID  . saccharomyces boulardii  250 mg Oral BID    Studies/Results: DG C-Arm 1-60 Min-No Report  Result Date: 10/25/2019 Fluoroscopy was utilized by the requesting physician.  No radiographic interpretation.    Assessment: s/p Procedure(s): XI ROBOT ASSISTED SIGMOIDECTOMY CYSTOSCOPY WITH BILATERAL RETROGRADE, FIREFLY INJECTION  AND URETERAL CATHETER PLACEMENT Patient Active Problem List   Diagnosis Date Noted  .  Diverticular disease 10/25/2019  . Wears glasses   . IBS (irritable bowel syndrome)   . Rectal bleeding   . Diverticulitis   . Asthma     Expected post op course  Plan: Cont soft foods Ambulate Plan for d/c in AM   LOS: 2 days     .Vanita Panda, MD [facility], [place]    10/27/2019 8:30 AM

## 2019-10-27 NOTE — Discharge Instructions (Signed)

## 2019-10-28 NOTE — Progress Notes (Signed)
3 Days Post-Op   Subjective/Chief Complaint: walking halls  No BM yet    Objective: Vital signs in last 24 hours: Temp:  [97.9 F (36.6 C)-98.9 F (37.2 C)] 98.5 F (36.9 C) (04/24 0607) Pulse Rate:  [66-86] 66 (04/24 0607) Resp:  [18] 18 (04/24 0607) BP: (123-144)/(80-97) 144/97 (04/24 0607) SpO2:  [99 %-100 %] 99 % (04/24 0607) Weight:  [96.9 kg] 96.9 kg (04/24 0607) Last BM Date: 10/28/19  Intake/Output from previous day: 04/23 0701 - 04/24 0700 In: 820 [P.O.:820] Out: 4950 [Urine:4950] Intake/Output this shift: No intake/output data recorded.  Incision/Wound:CDI soft ND   Lab Results:  Recent Labs    10/26/19 0419 10/27/19 0352  WBC 9.8 5.8  HGB 13.4 12.2*  HCT 40.5 37.3*  PLT 226 186   BMET Recent Labs    10/26/19 0419 10/27/19 0352  NA 134* 140  K 4.2 4.0  CL 101 106  CO2 25 27  GLUCOSE 114* 93  BUN 9 9  CREATININE 0.72 0.73  CALCIUM 8.2* 8.5*   PT/INR No results for input(s): LABPROT, INR in the last 72 hours. ABG No results for input(s): PHART, HCO3 in the last 72 hours.  Invalid input(s): PCO2, PO2  Studies/Results: No results found.  Anti-infectives: Anti-infectives (From admission, onward)   Start     Dose/Rate Route Frequency Ordered Stop   10/25/19 1600  cefoTEtan (CEFOTAN) 2 g in sodium chloride 0.9 % 100 mL IVPB     2 g 200 mL/hr over 30 Minutes Intravenous Every 12 hours 10/25/19 1350 10/25/19 2024   10/25/19 0645  cefoTEtan (CEFOTAN) 2 g in sodium chloride 0.9 % 100 mL IVPB     2 g 200 mL/hr over 30 Minutes Intravenous On call to O.R. 10/25/19 8295 10/25/19 0837      Assessment/Plan: s/p Procedure(s): XI ROBOT ASSISTED SIGMOIDECTOMY (N/A) CYSTOSCOPY WITH BILATERAL RETROGRADE, FIREFLY INJECTION  AND URETERAL CATHETER PLACEMENT (Bilateral)   No BM but looks great  Ambulate  Hopefully home tomorrow   LOS: 3 days    Jacob Fus A [facility] 10/28/2019

## 2019-10-29 NOTE — Progress Notes (Signed)
Pharmacy Brief Note - Alvimopan (Entereg)  The standing order set for alvimopan (Entereg) now includes an automatic order to discontinue the drug after the patient has had a bowel movement.  The change was approved by the Pharmacy & Therapeutics Committee and the Medical Executive Committee.    This patient has had a bowel movement documented by nursing.  Therefore, alvimopan has been discontinued.  If there are questions, please contact the pharmacy at [phone].  Thank you- Junita Push, [facility] 10/29/2019 2:06 PM

## 2019-10-29 NOTE — Progress Notes (Signed)
4 Days Post-Op   Subjective/Chief Complaint: Had a BM but bloated  Doesn't feel ready to go home yet  Pain controlled  Tolerating diet    Objective: Vital signs in last 24 hours: Temp:  [98 F (36.7 C)-98.5 F (36.9 C)] 98 F (36.7 C) (04/25 0514) Pulse Rate:  [67-73] 67 (04/25 0514) Resp:  [15-18] 16 (04/25 0514) BP: (129-143)/(91-96) 130/92 (04/25 0514) SpO2:  [97 %-99 %] 97 % (04/25 0514) Last BM Date: 10/28/19  Intake/Output from previous day: 04/24 0701 - 04/25 0700 In: 740 [P.O.:740] Out: 1800 [Urine:1800] Intake/Output this shift: Total I/O In: -  Out: 1000 [Urine:1000]  Incision/Wound:CDI soft mild distention   Lab Results:  Recent Labs    10/27/19 0352  WBC 5.8  HGB 12.2*  HCT 37.3*  PLT 186   BMET Recent Labs    10/27/19 0352  NA 140  K 4.0  CL 106  CO2 27  GLUCOSE 93  BUN 9  CREATININE 0.73  CALCIUM 8.5*   PT/INR No results for input(s): LABPROT, INR in the last 72 hours. ABG No results for input(s): PHART, HCO3 in the last 72 hours.  Invalid input(s): PCO2, PO2  Studies/Results: No results found.  Anti-infectives: Anti-infectives (From admission, onward)   Start     Dose/Rate Route Frequency Ordered Stop   10/25/19 1600  cefoTEtan (CEFOTAN) 2 g in sodium chloride 0.9 % 100 mL IVPB     2 g 200 mL/hr over 30 Minutes Intravenous Every 12 hours 10/25/19 1350 10/25/19 2024   10/25/19 0645  cefoTEtan (CEFOTAN) 2 g in sodium chloride 0.9 % 100 mL IVPB     2 g 200 mL/hr over 30 Minutes Intravenous On call to O.R. 10/25/19 1610 10/25/19 0837      Assessment/Plan: s/p Procedure(s): XI ROBOT ASSISTED SIGMOIDECTOMY (N/A) CYSTOSCOPY WITH BILATERAL RETROGRADE, FIREFLY INJECTION  AND URETERAL CATHETER PLACEMENT (Bilateral) Continue ambulation Feels bloated today so hold on D/C    LOS: 4 days    Dortha Schwalbe MD  10/29/2019

## 2019-10-30 NOTE — Progress Notes (Signed)
Pt alert and oriented. Tolerating diet, pain controlled. D/C instructions given.  Pt d/cd to home.

## 2019-10-30 NOTE — Discharge Summary (Signed)
Physician Discharge Summary  Patient ID: Jacob Graham MRN: [id number] DOB/AGE: Oct 08, 1970 49 y.o.  Admit date: 10/25/2019 Discharge date: 10/30/2019  Admission Diagnoses: Diverticular disease  Discharge Diagnoses:  Active Problems:   Diverticular disease   Discharged Condition: good  Hospital Course: Patient was admitted after robotic assisted sigmoidectomy.  His diet was advanced as tolerated.  He began to ambulate on postop day 1.  His Foley was removed on postop day 1 as well.  He began to have bowel function on postop day 4.  He was ready for discharge on postop day 5.  His pain was controlled with tramadol.  He was tolerating a diet and having good bowel function.  Consults: None  Significant Diagnostic Studies: labs: cbc, bmet  Treatments: IV hydration, analgesia: acetaminophen and surgery: see above  Discharge Exam: Blood pressure 131/90, pulse 63, temperature 97.6 F (36.4 C), temperature source Oral, resp. rate 18, height 5\' 6"  (1.676 m), weight 98 kg, SpO2 97 %. General appearance: alert and cooperative GI: normal findings: soft, non-tender Incision/Wound: Clean, dry, intact  Disposition: Discharge disposition: 01-Home or Self Care        Allergies as of 10/30/2019      Reactions   Banana Anaphylaxis   Iodinated Diagnostic Agents Other (See Comments)   "Pain in testicles and burning in kidneys"      Medication List    STOP taking these medications   amoxicillin-clavulanate 875-125 MG tablet Commonly known as: AUGMENTIN     TAKE these medications   acetaminophen 325 MG tablet Commonly known as: TYLENOL Take 650 mg by mouth every 6 (six) hours as needed for mild pain.   acidophilus Caps capsule Take 1 capsule by mouth every evening.   acyclovir 200 MG capsule Commonly known as: ZOVIRAX Take 200 mg by mouth as needed.   albuterol 108 (90 Base) MCG/ACT inhaler Commonly known as: VENTOLIN HFA Inhale 1-2 puffs into the lungs every 6 (six) hours  as needed for wheezing or shortness of breath.   APPLE CIDER VINEGAR PO Take 5 mLs by mouth daily.   BENEFIBER DRINK MIX PO Take 1 Package by mouth in the morning, at noon, and at bedtime.   GARLIC PO Take 1 capsule by mouth 2 (two) times a week.   hydrocortisone 25 MG suppository Commonly known as: ANUSOL-HC Place 1 suppository (25 mg total) rectally at bedtime as needed for hemorrhoids or anal itching.   ibuprofen 200 MG tablet Commonly known as: ADVIL Take 400 mg by mouth every 6 (six) hours as needed for mild pain.   polyethylene glycol 17 g packet Commonly known as: MIRALAX / GLYCOLAX Take 17 g by mouth daily as needed (constipation.).   Protein Powd Take 1 Scoop by mouth daily.   psyllium 58.6 % packet Commonly known as: METAMUCIL Take 1 packet by mouth every other day.   saccharomyces boulardii 250 MG capsule Commonly known as: FLORASTOR Take 250 mg by mouth every evening.   traMADol 50 MG tablet Commonly known as: ULTRAM Take 1-2 tablets (50-100 mg total) by mouth every 6 (six) hours as needed (mild pain).   vitamin C with rose hips 500 MG tablet Take 500 mg by mouth 3 (three) times a week.   Vitamin D-3 125 MCG (5000 UT) Tabs Take 5,000 Units by mouth daily.      Follow-up Information    Leighton Ruff, MD. Schedule an appointment as soon as possible for a visit in 2 week(s).   Specialty: General Surgery Contact  information: [address] [place] [address] [phone]           Signed: Vanita Panda 10/30/2019, 9:15 AM

## 2019-12-08 ENCOUNTER — Telehealth: Payer: Self-pay | Admitting: Gastroenterology

## 2019-12-08 NOTE — Telephone Encounter (Signed)
Pt is 6 weeks out from his surgery with Dr. Maisie Fus. He has been having issues with constipation and has spoken with [facility] and was told to take miralax, metamucil. He has been taking this but still having issues. He is feeling more bloated and having a lot of gas. Tuesday he took 4 doses of miralax and finally had a BM but did not feel like a complete BM and it took him 7-8 trips to the bathroom. Reports he had a BM this am but not a good one. He has an OV scheduled for later this month but wanted to know what Dr. Lavon Paganini would recommend. States he is so bloated his stomach feels like a basketball. Please advise.

## 2019-12-08 NOTE — Telephone Encounter (Signed)
Please advise patient to switch to Benefiber or Psyllium husk instead of metamucil. Increase water intake to 8-10 glasses of water/day. Continue Miralax 1 capful daily and titrate to have 1-2 soft BM daily. Will discuss further at follow up visit. Thanks

## 2019-12-08 NOTE — Telephone Encounter (Signed)
Spoke with pt and he is aware of recommendations. 

## 2019-12-29 ENCOUNTER — Ambulatory Visit: Payer: Commercial Managed Care - PPO | Admitting: Nurse Practitioner

## 2019-12-29 ENCOUNTER — Ambulatory Visit (INDEPENDENT_AMBULATORY_CARE_PROVIDER_SITE_OTHER)
Admission: RE | Admit: 2019-12-29 | Discharge: 2019-12-29 | Disposition: A | Discharge status: Home / Self Care | Payer: Commercial Managed Care - PPO | Source: Ambulatory Visit | Attending: Nurse Practitioner | Admitting: Nurse Practitioner

## 2019-12-29 ENCOUNTER — Encounter: Payer: Self-pay | Admitting: Nurse Practitioner

## 2019-12-29 ENCOUNTER — Other Ambulatory Visit: Payer: Self-pay

## 2019-12-29 VITALS — BP 120/82 | HR 71 | Ht 66.0 in | Wt 212.0 lb

## 2019-12-29 DIAGNOSIS — K648 Other hemorrhoids: Secondary | ICD-10-CM

## 2019-12-29 DIAGNOSIS — K5900 Constipation, unspecified: Secondary | ICD-10-CM | POA: Diagnosis not present

## 2019-12-29 DIAGNOSIS — Z9049 Acquired absence of other specified parts of digestive tract: Secondary | ICD-10-CM

## 2019-12-29 DIAGNOSIS — R140 Abdominal distension (gaseous): Secondary | ICD-10-CM

## 2019-12-29 MED ORDER — HYDROCORTISONE ACETATE 25 MG RE SUPP: 25.0000 mg | Freq: Every day | RECTAL | 0 refills | Status: DC

## 2019-12-29 NOTE — Progress Notes (Signed)
12/29/2019 Jacob Graham [id number] [birth year]   Chief Complaint: Abdominal bloat/gas, constipation  History of Present Illness: Jacob Graham is a 49 year old male with a history of asthma, IBS and diverticular disease including diverticulitis with a microperforation s/p robotic assisted sigmoidectomy by Dr. Romie Levee on 10/25/2019. He presents today for further evaluation regarding a change in his bowel pattern. After his sigmoid resection, he had loose stools for a few days then resumed a normal bowel pattern, passing a normal soft brown stool daily while taking Miralax daily. Since his BMs were normal, he stopped taking Miralax. Within one week he became constipated. He restarted Miralax once daily and gradually increased Miralax to QAC and QHS. He was previously taking Metamucil then switched to Benefiber 2 tsp bid. He is now passing 3 to 7 watery stools mixed with bits of solid stool. No bloody diarrhea. His stool color is yellow to brown. The past 3 nights he has awakened during the middle of the night to pass a small volumes of watery stool with bits of solid stool. He is straining, pushing hard to pass a BM. He describes feeling as if the stool does not move out of the rectum normally.  He has occasional fecal leakage. He complains of having abdominal bloat daily which occurs after eating any foods. He is eating one meal daily at this point due to worsening bloat. No significant abdominal pain, describes having abdominal discomfort.   He is active, stated he is walking more now than he did 20 years ago.  He is taking Acidophilus probiotic 1 capsule 3 days weekly.  He is no longer taking Florastor. He was on Augmentin during his hospitalization for his sigmoid resection. No other antibiotics since that time. No weight loss. No fever, sweats of chills. He underwent a colonoscopy 08/04/2019 showed diverticulosis, no evidence of colitis or IBD.   Past Medical History:  Diagnosis Date   . Asthma   . Diverticulitis   . IBS (irritable bowel syndrome)   . Rectal bleeding   . Wears glasses      Past Surgical History:  Procedure Laterality Date  . APPENDECTOMY  1999  . colonoscopy  2008, 2014  . CYSTOSCOPY WITH RETROGRADE PYELOGRAM, URETEROSCOPY AND STENT PLACEMENT Bilateral 10/25/2019   Procedure: CYSTOSCOPY WITH BILATERAL RETROGRADE, FIREFLY INJECTION  AND URETERAL CATHETER PLACEMENT;  Surgeon: Sebastian Ache, MD;  Location: [facility];  Service: Urology;  Laterality: Bilateral;  . EVALUATION UNDER ANESTHESIA WITH FISTULECTOMY N/A 10/09/2015   Procedure: EXAM UNDER ANESTHESIA; FISTULOTOMY;  Surgeon: Romie Levee, MD;  Location: [facility];  Service: General;  Laterality: N/A;  . KNEE SURGERY  1993     Colonoscopy 08/04/2019 by Dr. Lavon Paganini: - The perianal and digital rectal examinations were normal. - A few small-mouthed diverticula were found in the sigmoid colon. Peri-diverticular erythema was seen. Biopsies were taken with a cold forceps for histology. - Non-bleeding internal hemorrhoids were found during retroflexion. The hemorrhoids were small. - The exam was otherwise without abnormality.    Current Outpatient Medications on File Prior to Visit  Medication Sig Dispense Refill  . acetaminophen (TYLENOL) 325 MG tablet Take 650 mg by mouth every 6 (six) hours as needed for mild pain.    Marland Kitchen acidophilus (RISAQUAD) CAPS capsule Take 1 capsule by mouth every evening.    Marland Kitchen acyclovir (ZOVIRAX) 200 MG capsule Take 200 mg by mouth as needed.    Marland Kitchen albuterol (VENTOLIN HFA) 108 (90 Base) MCG/ACT  inhaler Inhale 1-2 puffs into the lungs every 6 (six) hours as needed for wheezing or shortness of breath.    . APPLE CIDER VINEGAR PO Take 5 mLs by mouth daily.    . Ascorbic Acid (VITAMIN C WITH ROSE HIPS) 500 MG tablet Take 500 mg by mouth 3 (three) times a week.    . Cholecalciferol (VITAMIN D-3) 125 MCG (5000 UT) TABS Take 5,000 Units by mouth daily.    Marland Kitchen GARLIC PO  Take 1 capsule by mouth 2 (two) times a week.    Marland Kitchen ibuprofen (ADVIL,MOTRIN) 200 MG tablet Take 400 mg by mouth every 6 (six) hours as needed for mild pain.     . polyethylene glycol (MIRALAX / GLYCOLAX) 17 g packet Take 17 g by mouth 4 (four) times daily.     . Protein POWD Take 1 Scoop by mouth daily.    . Wheat Dextrin (BENEFIBER DRINK MIX PO) Take 1 Package by mouth in the morning, at noon, and at bedtime.    . Wheat Dextrin (BENEFIBER PO) Take by mouth daily.     No current facility-administered medications on file prior to visit.    Allergies  Allergen Reactions  . Banana Anaphylaxis  . Iodinated Diagnostic Agents Other (See Comments)    "Pain in testicles and burning in kidneys"    Current Medications, Allergies, Past Medical History, Past Surgical History, Family History and Social History were reviewed in [employer] record.   Physical Exam: Ht 5\' 6"  (1.676 m)   Wt 212 lb (96.2 kg)   BMI 34.22 kg/m  General: Well developed 49 year old male in no acute distress. Head: Normocephalic and atraumatic. Eyes: No scleral icterus. Conjunctiva pink . Ears: Normal auditory acuity. Mouth: Dentition intact. No ulcers or lesions.  Lungs: Clear throughout to auscultation. Heart: Regular rate and rhythm, no murmur. Abdomen: Soft, nontender and nondistended. No masses or hepatomegaly. Normal bowel sounds x 4 quadrants. Robotic surgical scars intact.  Rectal: Left lateral external hemorrhoid inflamed, left lateral anal hemorrhoid with small ulceration and linear white coating, hemorrhoid appears friable without active bleeding. No stool in the rectal vault. Anal sphincter tone slightly diminished. Olivia Mackie CMA present during exam.  Musculoskeletal: Symmetrical with no gross deformities. Extremities: No edema. Neurological: Alert oriented x 4. No focal deficits.  Psychological: Alert and cooperative. Normal mood and affect  Assessment and Recommendations:  1. 49 year  old male with a history of diverticular disease s/p robotic assisted sigmoid resection presents with abdominal bloat, gas, constipation and loose stools on Benefiber 2 tsp tid and Miralax 1 capful QAC and Q HS. -Abdominal xray to rule out stool filled colon. No rectal impaction on exam. -Reduce Miralax to tid. Patient wishes to stop Benefiber and resume Metamucil.  -Further laxative regimen to be determined after xray result reviewed -Change probiotic once daily -IbGard 1 po bid, samples provided  -Stool cultures deferred as patient is on Miralax qid.  -Patient to call office if symptoms worsen -Follow up in office in 6 weeks   2. External and anal hemorrhoids inflamed and friable without active bleeding. -Anusol HC 25mg  suppository 1 PR QHS x 5 nights -Repeat rectal exam at time of office visit in 6 weeks  -See plan in # 1

## 2019-12-29 NOTE — Patient Instructions (Addendum)
If you are age 50 or older, your body mass index should be between 23-30. Your Body mass index is 34.22 kg/m. If this is out of the aforementioned range listed, please consider follow up with your Primary Care Provider.  If you are age 74 or younger, your body mass index should be between 19-25. Your Body mass index is 34.22 kg/m. If this is out of the aformentioned range listed, please consider follow up with your Primary Care Provider.   Please head to the basement level for an Xray of your abdomen  Okay to change fiber to metamucil. Miralax 1 capful three times daily. Change your probiotic    We have provided you with 4 sample boxes of IBguard. Pleas use 1 capsule twice daily for abdominal bloat and gas.   Please follow up in 6 weeks with Dr Lavon Paganini 02/13/2020 3:30pm  [facility]'s information is below: Address: [address] [place], [place] [address]  [phone]  *Please DO NOT go directly from our office to pick up this medication! Give the pharmacy 1 day to process the prescription as this is compounded and takes time to make.     Due to recent changes in healthcare laws, you may see the results of your imaging and laboratory studies on MyChart before your provider has had a chance to review them.  We understand that in some cases there may be results that are confusing or concerning to you. Not all laboratory results come back in the same time frame and the provider may be waiting for multiple results in order to interpret others.  Please give [place] 48 hours in order for your provider to thoroughly review all the results before contacting the office for clarification of your results.   Thank you for choosing [facility], CRNP

## 2020-01-04 NOTE — Progress Notes (Signed)
Reviewed and agree with documentation and assessment and plan. K. Veena Anneta Rounds , MD   

## 2020-02-07 ENCOUNTER — Ambulatory Visit: Payer: Commercial Managed Care - PPO | Admitting: Gastroenterology

## 2020-02-13 ENCOUNTER — Ambulatory Visit: Payer: Commercial Managed Care - PPO | Admitting: Gastroenterology

## 2020-07-09 ENCOUNTER — Encounter: Payer: Self-pay | Admitting: Gastroenterology

## 2020-07-09 ENCOUNTER — Ambulatory Visit (INDEPENDENT_AMBULATORY_CARE_PROVIDER_SITE_OTHER): Payer: Commercial Managed Care - PPO | Admitting: Gastroenterology

## 2020-07-09 VITALS — BP 122/80 | HR 72 | Ht 66.0 in | Wt 219.2 lb

## 2020-07-09 DIAGNOSIS — K5732 Diverticulitis of large intestine without perforation or abscess without bleeding: Secondary | ICD-10-CM | POA: Diagnosis not present

## 2020-07-09 DIAGNOSIS — K649 Unspecified hemorrhoids: Secondary | ICD-10-CM

## 2020-07-09 DIAGNOSIS — K581 Irritable bowel syndrome with constipation: Secondary | ICD-10-CM

## 2020-07-09 DIAGNOSIS — K5902 Outlet dysfunction constipation: Secondary | ICD-10-CM

## 2020-07-09 DIAGNOSIS — Z9049 Acquired absence of other specified parts of digestive tract: Secondary | ICD-10-CM

## 2020-07-09 NOTE — Progress Notes (Signed)
Jacob Graham    [id number]    14-Mar-1971  Primary Care Physician:[facility]  Referring Physician: [facility], [facility] [address] [place],  [place] [address]   Chief complaint:  Constipation  HPI:  49 yr very pleasant gentleman here for follow up visit for constipation, abdominal bloating and discomfort.  He is taking Metamucil and Bene fiber along with Magnesium 3 times daily.  He also uses MiraLAX as needed.  He is able to have a bowel movement but has to strain excessively even then only a small thin ribbon like stool comes out. He has sensation of incomplete defecation. He is having swelling and bleeding from hemorrhoids. He feels bloated and has discomfort in both R and L lower abdomen.   Constipation and incomplete defecation is worse after surgery though his frequency of bowel movements has increased  Colonoscopy 08/04/2019 showed diverticulosis and internal hemorrhoids, no evidence of colitis or IBD.  History of diverticulitis with a microperforation s/p robotic assisted sigmoidectomy by Dr. Romie Levee on 10/25/2019   Outpatient Encounter Medications as of 07/09/2020  Medication Sig  . acetaminophen (TYLENOL) 325 MG tablet Take 650 mg by mouth every 6 (six) hours as needed for mild pain.  Marland Kitchen acidophilus (RISAQUAD) CAPS capsule Take 1 capsule by mouth every evening.  Marland Kitchen acyclovir (ZOVIRAX) 200 MG capsule Take 200 mg by mouth as needed.  Marland Kitchen albuterol (VENTOLIN HFA) 108 (90 Base) MCG/ACT inhaler Inhale 1-2 puffs into the lungs every 6 (six) hours as needed for wheezing or shortness of breath.  . APPLE CIDER VINEGAR PO Take 5 mLs by mouth daily.  . Ascorbic Acid (VITAMIN C WITH ROSE HIPS) 500 MG tablet Take 500 mg by mouth 3 (three) times a week.  . Cholecalciferol (VITAMIN D-3) 125 MCG (5000 UT) TABS Take 5,000 Units by mouth daily.  Marland Kitchen GARLIC PO Take 1 capsule by mouth 2 (two) times a week.  . hydrocortisone (ANUSOL-HC) 25 MG suppository  Place 1 suppository (25 mg total) rectally at bedtime.  Marland Kitchen ibuprofen (ADVIL,MOTRIN) 200 MG tablet Take 400 mg by mouth every 6 (six) hours as needed for mild pain.   . polyethylene glycol (MIRALAX / GLYCOLAX) 17 g packet Take 17 g by mouth 4 (four) times daily.   . Protein POWD Take 1 Scoop by mouth daily.  . Wheat Dextrin (BENEFIBER DRINK MIX PO) Take 1 Package by mouth in the morning, at noon, and at bedtime.  . Wheat Dextrin (BENEFIBER PO) Take by mouth daily.   No facility-administered encounter medications on file as of 07/09/2020.    Allergies as of 07/09/2020 - Review Complete 12/29/2019  Allergen Reaction Noted  . 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 Iodinated diagnostic agents Other (See Comments) 06/15/2019    Past Medical History:  Diagnosis Date  . Asthma   . Diverticulitis   . IBS (irritable bowel syndrome)   . Rectal bleeding   . Wears glasses     Past Surgical History:  Procedure Laterality Date  . APPENDECTOMY  1999  . colonoscopy  2008, 2014  . CYSTOSCOPY WITH RETROGRADE PYELOGRAM, URETEROSCOPY AND STENT PLACEMENT Bilateral 10/25/2019   Procedure: CYSTOSCOPY WITH BILATERAL RETROGRADE, FIREFLY INJECTION  AND URETERAL CATHETER PLACEMENT;  Surgeon: Sebastian Ache, MD;  Location: [facility];  Service: Urology;  Laterality: Bilateral;  . EVALUATION UNDER ANESTHESIA WITH FISTULECTOMY N/A 10/09/2015   Procedure: EXAM UNDER ANESTHESIA; FISTULOTOMY;  Surgeon: Romie Levee, MD;  [facility];  Service: General;  Laterality: N/A;  . KNEE SURGERY  1993    Family History  Problem Relation Age of Onset  . Seizures Mother        partial complex   . Cancer Paternal Grandfather        Colon  . Colon cancer Paternal Grandfather 39    Social History   Socioeconomic History  . Marital status: Married    Spouse name: Not on file  . Number of children: Not on file  . Years of education: Not on file  . Highest education level: Not on file  Occupational  History  . Not on file  Tobacco Use  . Smoking status: Former Smoker    Types: Cigarettes    Quit date: 03/03/2004    Years since quitting: 16.3  . Smokeless tobacco: Former Clinical biochemist  . Vaping Use: Never used  Substance and Sexual Activity  . Alcohol use: Yes    Alcohol/week: 2.0 standard drinks    Types: 2 Glasses of wine per week    Comment: occ  . Drug use: No  . Sexual activity: Not on file  Other Topics Concern  . Not on file  Social History Narrative  . Not on file   Social Determinants of Health   Financial Resource Strain: Not on file  Food Insecurity: Not on file  Transportation Needs: Not on file  Physical Activity: Not on file  Stress: Not on file  Social Connections: Not on file  Intimate Partner Violence: Not on file      Review of systems: All other review of systems negative except as mentioned in the HPI.   Physical Exam: Vitals:   07/09/20 0859  BP: 122/80  Pulse: 72   Body mass index is 35.38 kg/m. 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  Continue MiraLAX and magnesium as needed Plan to refer to pelvic floor PT based on fingings of manometry for biofeedback  Symptomatic hemorrhoids: will plan for hemorrhoidal banding once constipation improves Advised patient to avoid excessive straining  Return in 2-3 months or sooner if needed  This visit required >30 minutes of patient care (this includes precharting, chart review,  review of results, face-to-face time used for counseling as well as treatment plan and follow-up. The patient was provided an opportunity to ask questions and all were answered. The patient agreed with the plan and demonstrated an understanding of the instructions.  Iona Beard , MD    CC: [facility]

## 2020-07-09 NOTE — Progress Notes (Signed)
cons

## 2020-07-09 NOTE — Patient Instructions (Addendum)
You have been scheduled to have an anorectal manometry at [facility] on 07/24/2020 at 8:30am. Please arrive 30 minutes prior to your appointment time for registration (1st floor of the hospital-admissions).  Please make certain to use 1 Fleets enema 2 hours prior to coming for your appointment. You can purchase Fleets enemas from the laxative section at your drug store. You should not eat anything during the two hours prior to the procedure. You may take regular medications with small sips of water at least 2 hours prior to the study.  Anorectal manometry is a test performed to evaluate patients with constipation or fecal incontinence. This test measures the pressures of the anal sphincter muscles, the sensation in the rectum, and the neural reflexes that are needed for normal bowel movements.  THE PROCEDURE The test takes approximately 30 minutes to 1 hour. You will be asked to change into a hospital gown. A technician or nurse will explain the procedure to you, take a brief health history, and answer any questions you may have. The patient then lies on his or her left side. A small, flexible tube, about the size of a thermometer, with a balloon at the end is inserted into the rectum. The catheter is connected to a machine that measures the pressure. During the test, the small balloon attached to the catheter may be inflated in the rectum to assess the normal reflex pathways. The nurse or technician may also ask the person to squeeze, relax, and push at various times. The anal sphincter muscle pressures are measured during each of these maneuvers. To squeeze, the patient tightens the sphincter muscles as if trying to prevent anything from coming out. To push or bear down, the patient strains down as if trying to have a bowel movement.  Due to recent COVID-19 restrictions implemented by our local and state authorities and in an effort to keep both patients and staff as safe as possible, our  hospital system now requires COVID-19 testing prior to any scheduled hospital procedure. Please go to [address] [place], [place], [place] [address] on 07/20/2020 at  11:30am. This is a drive up testing site, you will not need to exit your vehicle.  You will not be billed at the time of testing but may receive a bill later depending on your insurance. The approximate cost of the test is $100. You must agree to quarantine from the time of your testing until the procedure date on 07/24/2020 . This should include staying at home with ONLY the people you live with. Avoid take-out, grocery store shopping or leaving the house for any non-emergent reason. Failure to have your COVID-19 test done on the date and time you have been scheduled will result in cancellation of procedure. Please call our office at [phone] if you have any questions.   Follow up in 3 months  I appreciate the  opportunity to care for you  Thank You   Marsa Aris , MD

## 2020-07-20 ENCOUNTER — Other Ambulatory Visit (HOSPITAL_COMMUNITY)
Admission: RE | Admit: 2020-07-20 | Discharge: 2020-07-20 | Disposition: A | Discharge status: Home / Self Care | Payer: Commercial Managed Care - PPO | Source: Ambulatory Visit | Attending: Gastroenterology | Admitting: Gastroenterology

## 2020-07-20 DIAGNOSIS — Z20822 Contact with and (suspected) exposure to covid-19: Secondary | ICD-10-CM | POA: Insufficient documentation

## 2020-07-20 DIAGNOSIS — Z01812 Encounter for preprocedural laboratory examination: Secondary | ICD-10-CM | POA: Diagnosis not present

## 2020-07-20 LAB — SARS CORONAVIRUS 2 (TAT 6-24 HRS): SARS Coronavirus 2: NEGATIVE

## 2020-07-24 ENCOUNTER — Encounter (HOSPITAL_COMMUNITY)
Admission: RE | Disposition: A | Discharge status: Home / Self Care | Payer: Self-pay | Source: Ambulatory Visit | Attending: Gastroenterology

## 2020-07-24 ENCOUNTER — Encounter (HOSPITAL_COMMUNITY): Payer: Self-pay | Admitting: Gastroenterology

## 2020-07-24 ENCOUNTER — Ambulatory Visit (HOSPITAL_COMMUNITY)
Admission: RE | Admit: 2020-07-24 | Discharge: 2020-07-24 | Disposition: A | Discharge status: Home / Self Care | Payer: Commercial Managed Care - PPO | Source: Ambulatory Visit | Attending: Gastroenterology | Admitting: Gastroenterology

## 2020-07-24 DIAGNOSIS — K5902 Outlet dysfunction constipation: Secondary | ICD-10-CM

## 2020-07-24 DIAGNOSIS — K5900 Constipation, unspecified: Secondary | ICD-10-CM | POA: Insufficient documentation

## 2020-07-24 DIAGNOSIS — R198 Other specified symptoms and signs involving the digestive system and abdomen: Secondary | ICD-10-CM | POA: Insufficient documentation

## 2020-07-24 HISTORY — PX: ANAL RECTAL MANOMETRY: SHX6358

## 2020-07-24 SURGERY — MANOMETRY, ANORECTAL

## 2020-07-24 NOTE — Progress Notes (Signed)
2D Anal Manometry done per protocol. This RN and Tillie Fantasia RN at bedside. No complication noted. Pt tolerated well

## 2020-07-29 ENCOUNTER — Other Ambulatory Visit: Payer: Self-pay

## 2020-07-29 ENCOUNTER — Encounter (HOSPITAL_COMMUNITY): Payer: Self-pay | Admitting: Emergency Medicine

## 2020-07-29 ENCOUNTER — Telehealth: Payer: Self-pay | Admitting: Gastroenterology

## 2020-07-29 ENCOUNTER — Emergency Department (HOSPITAL_COMMUNITY): Payer: Commercial Managed Care - PPO

## 2020-07-29 ENCOUNTER — Emergency Department (HOSPITAL_COMMUNITY)
Admission: EM | Admit: 2020-07-29 | Discharge: 2020-07-29 | Disposition: A | Discharge status: Home / Self Care | Payer: Commercial Managed Care - PPO | Attending: Emergency Medicine | Admitting: Emergency Medicine

## 2020-07-29 DIAGNOSIS — K5902 Outlet dysfunction constipation: Secondary | ICD-10-CM | POA: Diagnosis not present

## 2020-07-29 DIAGNOSIS — R112 Nausea with vomiting, unspecified: Secondary | ICD-10-CM

## 2020-07-29 DIAGNOSIS — J45909 Unspecified asthma, uncomplicated: Secondary | ICD-10-CM | POA: Diagnosis not present

## 2020-07-29 DIAGNOSIS — R109 Unspecified abdominal pain: Secondary | ICD-10-CM | POA: Diagnosis present

## 2020-07-29 DIAGNOSIS — Z87891 Personal history of nicotine dependence: Secondary | ICD-10-CM | POA: Insufficient documentation

## 2020-07-29 DIAGNOSIS — R1110 Vomiting, unspecified: Secondary | ICD-10-CM | POA: Insufficient documentation

## 2020-07-29 LAB — LIPASE, BLOOD: Lipase: 23 U/L (ref 11–51)

## 2020-07-29 LAB — COMPREHENSIVE METABOLIC PANEL
ALT: 24 U/L (ref 0–44)
AST: 20 U/L (ref 15–41)
Albumin: 4.3 g/dL (ref 3.5–5.0)
Alkaline Phosphatase: 65 U/L (ref 38–126)
Anion gap: 13 (ref 5–15)
BUN: 19 mg/dL (ref 6–20)
CO2: 23 mmol/L (ref 22–32)
Calcium: 9.1 mg/dL (ref 8.9–10.3)
Chloride: 103 mmol/L (ref 98–111)
Creatinine, Ser: 0.86 mg/dL (ref 0.61–1.24)
GFR, Estimated: >60 mL/min (ref >60)
Glucose, Bld: 110 mg/dL — ABNORMAL HIGH (ref 70–99)
Potassium: 3.8 mmol/L (ref 3.5–5.1)
Sodium: 139 mmol/L (ref 135–145)
Total Bilirubin: 1 mg/dL (ref 0.3–1.2)
Total Protein: 6.9 g/dL (ref 6.5–8.1)

## 2020-07-29 LAB — URINALYSIS, ROUTINE W REFLEX MICROSCOPIC
Bilirubin Urine: NEGATIVE
Glucose, UA: NEGATIVE mg/dL
Hgb urine dipstick: NEGATIVE
Ketones, ur: NEGATIVE mg/dL
Leukocytes,Ua: NEGATIVE
Nitrite: NEGATIVE
Protein, ur: NEGATIVE mg/dL
Specific Gravity, Urine: 1.028 (ref 1.005–1.030)
pH: 6 (ref 5.0–8.0)

## 2020-07-29 LAB — CBC
HCT: 49.2 % (ref 39.0–52.0)
Hemoglobin: 17.2 g/dL — ABNORMAL HIGH (ref 13.0–17.0)
MCH: 31.8 pg (ref 26.0–34.0)
MCHC: 35 g/dL (ref 30.0–36.0)
MCV: 90.9 fL (ref 80.0–100.0)
Platelets: 230 10*3/uL (ref 150–400)
RBC: 5.41 MIL/uL (ref 4.22–5.81)
RDW: 12.9 % (ref 11.5–15.5)
WBC: 10.9 10*3/uL — ABNORMAL HIGH (ref 4.0–10.5)
nRBC: 0 % (ref 0.0–0.2)

## 2020-07-29 MED ORDER — SORBITOL 70 % SOLN
960.0000 mL | TOPICAL_OIL | Freq: Once | ORAL | Status: AC
  Administered 2020-07-29: 960 mL via RECTAL
  Filled 2020-07-29: qty 473

## 2020-07-29 MED ORDER — WITCH HAZEL-GLYCERIN EX PADS: 1.0000 "application " | MEDICATED_PAD | CUTANEOUS | 12 refills | Status: DC | PRN

## 2020-07-29 MED ORDER — ONDANSETRON 4 MG PO TBDP: 4.0000 mg | ORAL_TABLET | Freq: Three times a day (TID) | ORAL | 0 refills | Status: DC | PRN

## 2020-07-29 MED ORDER — SODIUM CHLORIDE 0.9 % IV BOLUS
1000.0000 mL | Freq: Once | INTRAVENOUS | Status: AC
  Administered 2020-07-29: 1000 mL via INTRAVENOUS

## 2020-07-29 MED ORDER — ONDANSETRON 4 MG PO TBDP: 4.0000 mg | ORAL_TABLET | Freq: Once | ORAL | Status: DC | PRN

## 2020-07-29 MED ORDER — MORPHINE SULFATE (PF) 4 MG/ML IV SOLN
4.0000 mg | Freq: Once | INTRAVENOUS | Status: AC
Start: 2020-07-29 — End: 2020-07-29
  Administered 2020-07-29: 4 mg via INTRAVENOUS
  Filled 2020-07-29: qty 1

## 2020-07-29 MED ORDER — IOHEXOL 300 MG/ML  SOLN
100.0000 mL | Freq: Once | INTRAMUSCULAR | Status: AC | PRN
  Administered 2020-07-29: 100 mL via INTRAVENOUS

## 2020-07-29 MED ORDER — DIPHENHYDRAMINE HCL 25 MG PO CAPS: 50.0000 mg | ORAL_CAPSULE | Freq: Once | ORAL | Status: AC

## 2020-07-29 MED ORDER — ONDANSETRON HCL 4 MG/2ML IJ SOLN
4.0000 mg | Freq: Once | INTRAMUSCULAR | Status: AC
Start: 2020-07-29 — End: 2020-07-29
  Administered 2020-07-29: 4 mg via INTRAVENOUS
  Filled 2020-07-29: qty 2

## 2020-07-29 MED ORDER — SORBITOL 70 % SOLN
960.0000 mL | TOPICAL_OIL | Freq: Once | ORAL | Status: DC
  Filled 2020-07-29: qty 473

## 2020-07-29 MED ORDER — HYDROCORTISONE NA SUCCINATE PF 250 MG IJ SOLR
200.0000 mg | Freq: Once | INTRAMUSCULAR | Status: AC
  Administered 2020-07-29: 200 mg via INTRAVENOUS
  Filled 2020-07-29: qty 200

## 2020-07-29 MED ORDER — PEG-KCL-NACL-NASULF-NA ASC-C 100 G PO SOLR: 1.0000 | Freq: Once | ORAL | 0 refills | Status: AC

## 2020-07-29 MED ORDER — ONDANSETRON HCL 4 MG/2ML IJ SOLN
4.0000 mg | Freq: Once | INTRAMUSCULAR | Status: AC
  Administered 2020-07-29: 4 mg via INTRAVENOUS
  Filled 2020-07-29: qty 2

## 2020-07-29 MED ORDER — DIPHENHYDRAMINE HCL 50 MG/ML IJ SOLN
50.0000 mg | Freq: Once | INTRAMUSCULAR | Status: AC
  Administered 2020-07-29: 50 mg via INTRAVENOUS
  Filled 2020-07-29: qty 1

## 2020-07-29 MED ORDER — HYDROCORTISONE (PERIANAL) 2.5 % EX CREA: 1.0000 "application " | TOPICAL_CREAM | Freq: Two times a day (BID) | CUTANEOUS | 0 refills | Status: DC

## 2020-07-29 NOTE — Telephone Encounter (Signed)
Pt states that he has been unable to use the bathroom for a while. He is currently at the ED and received an evaluation. He said that they told him that he is constipated and is going to be discharged soon but pt is getting frustrated and states that "he cannot live like this" and would like some advise.

## 2020-07-29 NOTE — Discharge Instructions (Addendum)
If pharmacy does not have moviprep bowel prep [facility] states they have samples that they can give you in the office.  Call them to pick them up  Return for new or worsening symptoms.

## 2020-07-29 NOTE — ED Notes (Signed)
Patient noted to be actively vomiting, standing order placed for zofran

## 2020-07-29 NOTE — ED Triage Notes (Signed)
Patient presents with constipation x3 days. Patient states that he had an expulsion test done and since then has been unable to have a bowel movement. Patient states he feels like he needs to go, but when he attempts he feels like, "it's hitting a wall." Patient states he feels like his intestines are "going to explode."

## 2020-07-29 NOTE — ED Notes (Signed)
Pt requesting half hour before enema.

## 2020-07-29 NOTE — ED Notes (Signed)
Patient given ice chips. 

## 2020-07-29 NOTE — ED Notes (Signed)
Pt out of bed to bathroom without issue.

## 2020-07-29 NOTE — ED Provider Notes (Signed)
Care transferred from [facility] at shift change. See note for full HPI.  In summation, Hx of chronic constipation. Recently has Expulsion test 3-4 days ago. No BM since   Intermittent abd pain, Constipation since diverticulitis in April s/p sigmoidectomy.  Expulsion test (Rectal Manometry) 3-4 days ago.   Rectal exam normal per previous provider, No impaction, Good tone  GI- [facility], [place]  Plan to F/U on CT imaging. If no significant findings dc home with GI follow up.  Physical Exam  BP (!) 116/106   Pulse 98   Temp 98.8 F (37.1 C) (Oral)   Resp (!) 21   Ht 5\' 6"  (1.676 m)   Wt 99.3 kg   SpO2 97%   BMI 35.35 kg/m   Physical Exam Vitals and nursing note reviewed.  Constitutional:      General: He is not in acute distress.    Appearance: He is well-developed and well-nourished. He is not ill-appearing, toxic-appearing or diaphoretic.  HENT:     Head: Normocephalic and atraumatic.  Eyes:     Pupils: Pupils are equal, round, and reactive to light.  Cardiovascular:     Rate and Rhythm: Normal rate and regular rhythm.  Pulmonary:     Effort: Pulmonary effort is normal. No respiratory distress.  Abdominal:     General: There is no distension.     Palpations: Abdomen is soft.  Musculoskeletal:        General: Normal range of motion.     Cervical back: Normal range of motion and neck supple.  Skin:    General: Skin is warm and dry.  Neurological:     Mental Status: He is alert.  Psychiatric:        Mood and Affect: Mood and affect normal.    ED Course/Procedures     Procedures Labs Reviewed  COMPREHENSIVE METABOLIC PANEL - Abnormal; Notable for the following components:      Result Value   Glucose, Bld 110 (*)    All other components within normal limits  CBC - Abnormal; Notable for the following components:   WBC 10.9 (*)    Hemoglobin 17.2 (*)    All other components within normal limits  LIPASE, BLOOD  URINALYSIS, ROUTINE W REFLEX MICROSCOPIC  POC  OCCULT BLOOD, ED  CT ABDOMEN PELVIS W CONTRAST  Result Date: 07/29/2020 CLINICAL DATA:  Constipation. EXAM: CT ABDOMEN AND PELVIS WITH CONTRAST TECHNIQUE: Multidetector CT imaging of the abdomen and pelvis was performed using the standard protocol following bolus administration of intravenous contrast. CONTRAST:  07/31/2020 OMNIPAQUE IOHEXOL 300 MG/ML  SOLN COMPARISON:  06/20/2019. FINDINGS: Lower chest: Lung bases are clear. Heart size normal. No pericardial or pleural effusion. Distal esophagus is unremarkable. Hepatobiliary: Liver may be slightly decreased in attenuation diffusely. Liver and gallbladder are otherwise unremarkable. No biliary ductal dilatation. Pancreas: Negative. Spleen: Negative. Adrenals/Urinary Tract: Adrenal glands and kidneys are unremarkable. Ureters are decompressed. Bladder is grossly unremarkable. Stomach/Bowel: Stomach and small bowel are unremarkable. Appendix is not readily visualized. Stool is seen in the majority of the colon. Sigmoid colon resection. Vascular/Lymphatic: Atherosclerotic calcification of the aorta. No pathologically enlarged lymph nodes. Reproductive: Prostate is visualized. Other: No free fluid.  Mesenteries and peritoneum are unremarkable. Musculoskeletal: No worrisome lytic or sclerotic lesions. Bone island in the L1 vertebral body. IMPRESSION: 1. Stool throughout the colon is indicative of constipation. 2. Liver may be steatotic. 3.  Aortic atherosclerosis (ICD10-I70.0). Electronically Signed   By: 06/22/2019 M.D.   On: 07/29/2020  10:09   MDM  Plan to follow up on CT imaging. If no significant finding dc home with GI follow up.  CT with constipation however no other acute intra-abdominal process.  Patient requesting enema here.  Will perform one.  Patient with 2 episodes of NBNB emesis here.  Will give Zofran.  Was mildly successful with,.  Will consult with GI  CONSULT with Carmel Sacramento, PA for [facility].  Reccomends additional smog enema here.   If his emesis is controlled, DC home with bowel prep, recommends MoviPrep or GoLytely and can follow up in office. If persistent emesis requiring admission, admit to hospitalist and let GI know to follow along.  Discussed plan with patient agreeable with plan for additional enema and PO challenge.  If able to dc home dc home with Moviprep or Golytely bowel prep, Anusol and tucks pads for hemorrhoids   Patient reassessed.  He does not want to do additional enema while he is here in the emergency department. Was able to have small BM here in ED.Does states his hemorrhoids are "acting up." Rectal exam performed by previous provider. States he feels improved and would like to DC home.  As such I have written for prescriptions recommended by GI.  He has been able to tolerate p.o. intake here in ED.  Discussed close follow-up with GI.  Patient does not meet the SIRS or Sepsis criteria.  On repeat exam patient does not have a surgical abdomin and there are no peritoneal signs.  No indication of appendicitis, bowel obstruction, bowel perforation, cholecystitis, diverticulitis.  The patient has been appropriately medically screened and/or stabilized in the ED. I have low suspicion for any other emergent medical condition which would require further screening, evaluation or treatment in the ED or require inpatient management.  Patient is hemodynamically stable and in no acute distress.  Patient able to ambulate in department prior to ED.  Evaluation does not show acute pathology that would require ongoing or additional emergent interventions while in the emergency department or further inpatient treatment.  I have discussed the diagnosis with the patient and answered all questions.  Pain is been managed while in the emergency department and patient has no further complaints prior to discharge.  Patient is comfortable with plan discussed in room and is stable for discharge at this time.  I have discussed strict  return precautions for returning to the emergency department.  Patient was encouraged to follow-up with PCP/specialist refer to at discharge.  1. Constipation 2. Emesis       Adahlia Stembridge A, PA-C 07/29/20 1455    Linwood Dibbles, MD 07/30/20 [phone]

## 2020-07-29 NOTE — ED Provider Notes (Signed)
[facility]-EMERGENCY DEPT Provider Note   CSN: [id number] Arrival date & time: 07/29/20  0258     History Chief Complaint  Patient presents with  . Abdominal Pain  . Constipation    Jacob Graham is a 50 y.o. male.  The history is provided by the patient. No language interpreter was used.  Abdominal Pain Associated symptoms: constipation   Constipation Associated symptoms: abdominal pain      50 year old male significant history of diverticular disease, irritable bowel syndrome, presenting for evaluation of abdominal pain.  Patient report in April of last year he had a bouts of diverticulitis with perforation require surgical sigmoidectomy.  Since then he has had intermittent episodes of constipation.  States he normally has a small bowel movement but within the past few weeks has had increased constipation.  He was seen by his GI specialist, Dr. Lavon Paganini 3 weeks ago for his symptoms.  He was scheduled for an rectal manometry which he had done 3 days ago.  States he was having difficulty with balloon expulsion during the test.  He mentions since then he has not had normal bowel movement.  States he have the urge to have bowel movement without the ability to produce.  He feels constipated and endorse a lot of rectal pressure.  Today he also felt nauseous and vomited this from this ER visit.  Vomitus is not feculent.  He does endorse having difficulty passing flatus.  He also endorsed mild urinary retention as well.  Does not claim any fever chills no cold symptoms.  He has not been vaccinated for COVID-19.  Past Medical History:  Diagnosis Date  . Asthma   . Diverticulitis   . IBS (irritable bowel syndrome)   . Rectal bleeding   . Wears glasses     Patient Active Problem List   Diagnosis Date Noted  . Diverticular disease 10/25/2019  . Wears glasses   . IBS (irritable bowel syndrome)   . Rectal bleeding   . Diverticulitis   . Asthma     Past  Surgical History:  Procedure Laterality Date  . ANAL RECTAL MANOMETRY N/A 07/24/2020   Procedure: ANO RECTAL MANOMETRY;  Surgeon: Napoleon Form, MD;  Location: [facility];  Service: Endoscopy;  Laterality: N/A;  . APPENDECTOMY  1999  . colonoscopy  2008, 2014  . CYSTOSCOPY WITH RETROGRADE PYELOGRAM, URETEROSCOPY AND STENT PLACEMENT Bilateral 10/25/2019   Procedure: CYSTOSCOPY WITH BILATERAL RETROGRADE, FIREFLY INJECTION  AND URETERAL CATHETER PLACEMENT;  Surgeon: Sebastian Ache, MD;  Location: [facility];  Service: Urology;  Laterality: Bilateral;  . EVALUATION UNDER ANESTHESIA WITH FISTULECTOMY N/A 10/09/2015   Procedure: EXAM UNDER ANESTHESIA; FISTULOTOMY;  Surgeon: Romie Levee, MD;  Location: [facility];  Service: General;  Laterality: N/A;  . KNEE SURGERY  1993       Family History  Problem Relation Age of Onset  . Seizures Mother        partial complex   . Cancer Paternal Grandfather        Colon  . Colon cancer Paternal Grandfather 67    Social History   Tobacco Use  . Smoking status: Former Smoker    Types: Cigarettes    Quit date: 03/03/2004    Years since quitting: 16.4  . Smokeless tobacco: Former Clinical biochemist  . Vaping Use: Never used  Substance Use Topics  . Alcohol use: Yes    Alcohol/week: 2.0 standard drinks    Types: 2 Glasses of  wine per week    Comment: occ  . Drug use: No    Home Medications Prior to Admission medications   Medication Sig Start Date End Date Taking? Authorizing Provider  acetaminophen (TYLENOL) 325 MG tablet Take 650 mg by mouth every 6 (six) hours as needed for mild pain.    [provider]  acidophilus (RISAQUAD) CAPS capsule Take 1 capsule by mouth every evening.    [provider]  acyclovir (ZOVIRAX) 200 MG capsule Take 200 mg by mouth as needed.    [provider]  albuterol (VENTOLIN HFA) 108 (90 Base) MCG/ACT inhaler Inhale 1-2 puffs into the lungs every 6 (six) hours as needed  for wheezing or shortness of breath.    [provider]  APPLE CIDER VINEGAR PO Take 5 mLs by mouth daily.    [provider]  Ascorbic Acid (VITAMIN C WITH ROSE HIPS) 500 MG tablet Take 500 mg by mouth 3 (three) times a week.    [provider]  Cholecalciferol (VITAMIN D-3) 125 MCG (5000 UT) TABS Take 5,000 Units by mouth daily.    [provider]  GARLIC PO Take 1 capsule by mouth 2 (two) times a week.    [provider]  hydrocortisone (ANUSOL-HC) 25 MG suppository Place 1 suppository (25 mg total) rectally at bedtime. 12/29/19   Arnaldo Natal, NP  ibuprofen (ADVIL,MOTRIN) 200 MG tablet Take 400 mg by mouth every 6 (six) hours as needed for mild pain.     [provider]  polyethylene glycol (MIRALAX / GLYCOLAX) 17 g packet Take 17 g by mouth 4 (four) times daily.     [provider]  Protein POWD Take 1 Scoop by mouth daily.    [provider]  Wheat Dextrin (BENEFIBER DRINK MIX PO) Take 1 Package by mouth in the morning, at noon, and at bedtime.    [provider]  Wheat Dextrin (BENEFIBER PO) Take by mouth daily.    [provider]    Allergies    Banana and Iodinated diagnostic agents  Review of Systems   Review of Systems  Gastrointestinal: Positive for abdominal pain and constipation.  All other systems reviewed and are negative.   Physical Exam Updated Vital Signs BP (!) 178/105 (BP Location: Right Arm)   Pulse 90   Temp 98.5 F (36.9 C) (Oral)   Resp 16   Ht 5\' 6"  (1.676 m)   Wt 99.3 kg   SpO2 99%   BMI 35.35 kg/m   Physical Exam Vitals and nursing note reviewed.  Constitutional:      General: He is not in acute distress.    Appearance: He is well-developed and well-nourished.  HENT:     Head: Atraumatic.  Eyes:     Conjunctiva/sclera: Conjunctivae normal.  Cardiovascular:     Rate and Rhythm: Normal rate and regular rhythm.  Pulmonary:     Effort: Pulmonary  effort is normal.     Breath sounds: Normal breath sounds.  Abdominal:     General: Abdomen is flat. Bowel sounds are normal.     Palpations: Abdomen is soft.     Tenderness: There is abdominal tenderness in the right upper quadrant. There is no guarding or rebound. Negative signs include McBurney's sign.     Hernia: No hernia is present.  Genitourinary:    Comments: Michela, NT, available to chaperone.  Evidence of external hemorrhoid noted on visual exam.  Nonthrombosed hemorrhoid, rectal tone intact, no significant  stool impaction, no blood on glove, no obvious mass. Musculoskeletal:     Cervical back: Neck supple.  Skin:    Findings: No rash.  Neurological:     Mental Status: He is alert.  Psychiatric:        Mood and Affect: Mood and affect normal.     ED Results / Procedures / Treatments   Labs (all labs ordered are listed, but only abnormal results are displayed) Labs Reviewed  COMPREHENSIVE METABOLIC PANEL - Abnormal; Notable for the following components:      Result Value   Glucose, Bld 110 (*)    All other components within normal limits  CBC - Abnormal; Notable for the following components:   WBC 10.9 (*)    Hemoglobin 17.2 (*)    All other components within normal limits  LIPASE, BLOOD  URINALYSIS, ROUTINE W REFLEX MICROSCOPIC  POC OCCULT BLOOD, ED    EKG None  Radiology No results found.  Procedures Procedures (including critical care time)  Medications Ordered in ED Medications  ondansetron (ZOFRAN-ODT) disintegrating tablet 4 mg (has no administration in time range)  diphenhydrAMINE (BENADRYL) capsule 50 mg (has no administration in time range)    Or  diphenhydrAMINE (BENADRYL) injection 50 mg (has no administration in time range)  hydrocortisone sodium succinate (SOLU-CORTEF) injection 200 mg (200 mg Intravenous Given 07/29/20 0517)  morphine 4 MG/ML injection 4 mg (4 mg Intravenous Given 07/29/20 0523)  ondansetron (ZOFRAN) injection 4 mg (4 mg  Intravenous Given 07/29/20 0523)    ED Course  I have reviewed the triage vital signs and the nursing notes.  Pertinent labs & imaging results that were available during my care of the patient were reviewed by me and considered in my medical decision making (see chart for details).    MDM Rules/Calculators/A&P                          BP (!) 147/88   Pulse 89   Temp 98.5 F (36.9 C) (Oral)   Resp 17   Ht 5\' 6"  (1.676 m)   Wt 99.3 kg   SpO2 96%   BMI 35.35 kg/m   Final Clinical Impression(s) / ED Diagnoses Final diagnoses:  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    Rx / DC Orders ED Discharge Orders    None     4:15 AM Patient with history of IBS-C, prior diverticulitis with microperforation status post sigmoidectomy a year ago who is here with complaints of constipation along with nausea and vomiting.  He is having nausea vomiting as well as constipation.  Due to prior colon surgery, his risk of SBO has increased therefore he will benefit from an abdominal pelvis CT scan for further evaluation.  Patient did report having adverse reaction to IV contrast dye in the past with sensation of "testicles tightening up".  Will premedicate prior to CT scan.  Rectal exam unremarkable.  6:13 AM Patient signed out to oncoming provider who will follow up on CT results.  If negative, recommend outpatient follow-up with his GI specialist Dr. for further care.   Lavon Paganini, PA-C 07/29/20 07/31/20    [id number], MD 07/29/20 [phone]

## 2020-07-29 NOTE — ED Notes (Signed)
Pt discharged from this ED in stable condition at this time. All discharge instructions and follow up care reviewed with pt with no further questions at this time. Pt ambulatory with steady gait, clear speech.  

## 2020-07-29 NOTE — Telephone Encounter (Signed)
He had the anorectal manometry on 07/24/20. Referral pending that. Suggestions? Or wait for the ED to advise him? Thanks

## 2020-07-30 ENCOUNTER — Telehealth: Payer: Self-pay | Admitting: Nurse Practitioner

## 2020-07-30 NOTE — Telephone Encounter (Signed)
Spoke with the patient. See the ED note. He had some relief with smog enema in the ED. The insurance did not cover the prep kit prescribed and it was too expensive for him to purchase. I am giving him a sample kit from the 3rd floor ([facility]). Do you want him back in to see you, APP appointment or telephone management?

## 2020-07-30 NOTE — Telephone Encounter (Signed)
Ok, thanks.

## 2020-07-30 NOTE — Telephone Encounter (Signed)
Beth can you look into this. Looks like he saw Dr Lavon Paganini recently, and she has availability sooner then his already scheduled appointment with [facility]. Thanks

## 2020-07-31 ENCOUNTER — Other Ambulatory Visit: Payer: Self-pay

## 2020-07-31 DIAGNOSIS — K5902 Outlet dysfunction constipation: Secondary | ICD-10-CM

## 2020-08-01 ENCOUNTER — Telehealth: Payer: Self-pay | Admitting: Gastroenterology

## 2020-08-01 ENCOUNTER — Ambulatory Visit: Payer: Commercial Managed Care - PPO | Admitting: Physician Assistant

## 2020-08-01 NOTE — Telephone Encounter (Signed)
Spoke with the patient. Advised of results showing dyssynergic defecation. Referral to [facility] at [facility] for bio-feedback and back/pelvic floor training.

## 2020-08-01 NOTE — Telephone Encounter (Signed)
Patient calling to get results from test done last week

## 2020-08-09 ENCOUNTER — Telehealth: Payer: Self-pay | Admitting: Gastroenterology

## 2020-08-09 ENCOUNTER — Other Ambulatory Visit: Payer: Self-pay

## 2020-08-09 DIAGNOSIS — K5902 Outlet dysfunction constipation: Secondary | ICD-10-CM

## 2020-08-09 NOTE — Telephone Encounter (Signed)
Tried to call PT at [facility]. Office is closed. Re-entered the referral and marked as urgent. Will follow up with a call to them on 08/12/20.

## 2020-08-09 NOTE — Telephone Encounter (Signed)
Pt states he has not heard from the referral for him to see to [facility].

## 2020-08-13 NOTE — Telephone Encounter (Signed)
Scheduled 09/25/20 for PT with Eulis Foster per referral notes.

## 2020-08-28 ENCOUNTER — Other Ambulatory Visit: Payer: Self-pay

## 2020-08-28 ENCOUNTER — Encounter: Payer: Self-pay | Admitting: Physical Therapy

## 2020-08-28 ENCOUNTER — Ambulatory Visit: Payer: Commercial Managed Care - PPO | Attending: Gastroenterology | Admitting: Physical Therapy

## 2020-08-28 DIAGNOSIS — K5902 Outlet dysfunction constipation: Secondary | ICD-10-CM

## 2020-08-28 DIAGNOSIS — M6281 Muscle weakness (generalized): Secondary | ICD-10-CM | POA: Diagnosis not present

## 2020-08-28 DIAGNOSIS — R278 Other lack of coordination: Secondary | ICD-10-CM

## 2020-08-28 NOTE — Therapy (Signed)
[facility] [address], [address] [place], [place], [address] Phone: [phone]   Fax:  [phone]  Physical Therapy Evaluation  Patient Details  Name: Jacob Graham MRN: [id number] Date of Birth: 12-19-1970 Referring Provider (PT): Dr. Napoleon Form   Encounter Date: 08/28/2020   PT End of Session - 08/28/20 1630    Visit Number 1    Date for PT Re-Evaluation 11/20/20    Authorization Type [employer]    Authorization - Visit Number 1    Authorization - Number of Visits 60    PT Start Time 1100    PT Stop Time 1145    PT Time Calculation (min) 45 min    Activity Tolerance Patient tolerated treatment well    Behavior During Therapy [facility] for tasks assessed/performed           Past Medical History:  Diagnosis Date  . Asthma   . Diverticulitis   . IBS (irritable bowel syndrome)   . Rectal bleeding   . Wears glasses     Past Surgical History:  Procedure Laterality Date  . ANAL RECTAL MANOMETRY N/A 07/24/2020   Procedure: ANO RECTAL MANOMETRY;  Surgeon: Napoleon Form, MD;  Location: [facility];  Service: Endoscopy;  Laterality: N/A;  . APPENDECTOMY  1999  . colonoscopy  2008, 2014  . CYSTOSCOPY WITH RETROGRADE PYELOGRAM, URETEROSCOPY AND STENT PLACEMENT Bilateral 10/25/2019   Procedure: CYSTOSCOPY WITH BILATERAL RETROGRADE, FIREFLY INJECTION  AND URETERAL CATHETER PLACEMENT;  Surgeon: Sebastian Ache, MD;  Location: [facility];  Service: Urology;  Laterality: Bilateral;  . EVALUATION UNDER ANESTHESIA WITH FISTULECTOMY N/A 10/09/2015   Procedure: EXAM UNDER ANESTHESIA; FISTULOTOMY;  Surgeon: Romie Levee, MD;  Location: [facility];  Service: General;  Laterality: N/A;  . KNEE SURGERY  1993    There were no vitals filed for this visit.    Subjective Assessment - 08/28/20 1111    Subjective I have type 6 and 7 stool. Patient has the feeling of going to the bathroom but not able to push the stool  out. Patient has to strain to have a bowel movement. Trouble releasing the gas. Stool comes out in a ribbon form. The sense of urination  is not there as much as it used to.    Patient Stated Goals to have a bowel movement without miralax    Currently in Pain? 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 Napoleon Form    Onset Date/Surgical Date --   chronic   Prior Therapy none      Precautions   Precautions None      Restrictions   Weight Bearing Restrictions No      Balance Screen   Has the patient fallen in the past 6 months No    Has the patient had a decrease in activity level because of a fear of falling?  No    Is the patient reluctant to leave their home because of a fear of falling?  No      Home Tourist information centre manager residence      Prior Function   Level of Independence Independent    Vocation Full time employment    Vocation Requirements sitting    Leisure bike,  treadmill on incline      Cognition   Overall Cognitive Status Within Functional Limits for tasks assessed      Observation/Other Assessments   Skin Integrity decreased mobility of abdominal scar and suprapubically      Posture/Postural Control   Posture/Postural Control No significant limitations      ROM / Strength   AROM / PROM / Strength AROM;PROM;Strength      AROM   Overall AROM Comments lumbar ROM is full      Strength   Overall Strength Comments bil. hip strength is 5/5                      Objective measurements completed on examination: See above findings.     Pelvic Floor Special Questions - 08/28/20 0001    Urinary Leakage No    Urinary frequency does not feel the urge to urinate as well recently    Skin Integrity Hemorroids;Intact    Pelvic Floor Internal Exam Patient confirms  identification and approves assessment of pelvic floor and treatment    Exam Type Rectal    Palpation tightness in the left side of the internal anal sphincter, tightness on bilateral sides of the urethra, and tightness of the puborectalis    Strength fair squeeze, definite lift    Tone not able to push the therapist finger out of the anus initially but with VC on belly bulging and using breath he was able to do it a little                    PT Education - 08/28/20 1629    Education Details instruction on toileting and abdominal massage; education on the pelvic floor and mechanics    Person(s) Educated Patient    Methods Explanation;Demonstration;Handout    Comprehension Verbalized understanding            PT Short Term Goals - 08/28/20 1640      PT SHORT TERM GOAL #1   Title independent with intial HEP for stretches    Time 4    Period Weeks    Status New    Target Date 09/25/20      PT SHORT TERM GOAL #2   Title able to bulge the pelvic floor with correct breathing    Time 4    Period Weeks    Status New    Target Date 09/25/20             PT Long Term Goals - 08/28/20 1641      PT LONG TERM GOAL #1   Title independent with advanced HEP for core strength    Time 12    Period Weeks    Status New    Target Date 11/20/20      PT LONG TERM GOAL #2   Title ability to have a bowel movement without straining and taking 50% less mirilax due to improved pelvic floor coordination    Time 12    Period Weeks    Status New    Target Date 11/20/20      PT LONG TERM GOAL #3   Title bowel movements are Type 4 or 3 with width of thumb size or more due to the ability to expand the anal canal to have a bowel movement    Time 12    Period Weeks    Status New    Target Date 11/20/20      PT LONG  TERM GOAL #4   Title when he has the urge to have a bowel movement he is able to have one due to improve coordination of the pelvic floor    Time 12    Period Weeks     Status New    Target Date 11/20/20                  Plan - 08/28/20 1631    Clinical Impression Statement Patient is a 50 year old male with constipatin and dyssynerfic defecation. Patient has been to the emergency room several times due to this issue. Patient reports he gets the urge to have a bowel movement but unable to push the stool out. He is taking Miralax 4 times per day so his stool is Type 6 and 7 but without it is Type 4 and shaped like a ribbon. Patient has to stain to have a bowel movement and does not feel like he is fully emptying his rectum. Patient also has noticed decreased sense of urge to urinate in the past several weeks. Pelvic floor strength is 3/5. He has tenderness located on the left internal anal sphincter with thickness. Tenderness and tightness located on the puborectalis and sides of the urethra. Patient was not able to push the therapist index finger out of the anal canal intially but with education on breath and bulging the pelvic floor he was able to push therapist finger out with minimal difficulty. Patient has abdominal scars that are restricted. 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 Patient will benefit from skilled therapy to improve pelvic floor coordination and strength to improve bowel movements.    Personal Factors and Comorbidities Comorbidity 2    Comorbidities fistula around the anus; anal fissures; abominal surgery for diverticultis    Examination-Activity Limitations Toileting    Stability/Clinical Decision Making Stable/Uncomplicated    Clinical Decision Making Low    Rehab Potential Excellent    PT Frequency 1x / week    PT Duration 12 weeks    PT Treatment/Interventions ADLs/Self Care Home Management;Biofeedback;Neuromuscular re-education;Therapeutic exercise;Therapeutic activities;Patient/family education;Manual techniques;Dry needling;Scar mobilization;Taping    PT Next Visit Plan internal work to the anus and  around the urethra; scar massage and education on how to perform at home; bulging of the pelvic floor; toileting, hip stretchs including happy baby, cat camel, prayer stretch and piriformis stretch, sitting on tennis ball to massage the pelvic floor    Consulted and Agree with Plan of Care Patient           Patient will benefit from skilled therapeutic intervention in order to improve the following deficits and impairments:  Decreased coordination,Increased fascial restricitons,Decreased strength,Decreased scar mobility,Decreased activity tolerance  Visit Diagnosis: Muscle weakness (generalized) - Plan: PT plan of care cert/re-cert  Other lack of coordination - Plan: PT plan of care cert/re-cert  Dyssynergic defecation - Plan: PT plan of care cert/re-cert  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 - Plan: PT plan of care cert/re-cert     Problem List Patient Active Problem List   Diagnosis Date Noted  . 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 Dyssynergic defecation   . Diverticular disease 10/25/2019  . Wears glasses   . IBS (irritable bowel syndrome)   . Rectal bleeding   . Diverticulitis   . Asthma     Eulis Foster, PT 08/28/20 4:46 PM ' [facility] [address], [address] [place], [place], [address] Phone:  [phone]   Fax:  [phone]  Name: Jacob Graham MRN: [id number] Date of Birth: [birth year]

## 2020-08-28 NOTE — Patient Instructions (Addendum)
Abdominal Massage for Constipation #celebratemuliebrity on you tube  Toileting Techniques for Bowel Movements (Defecation) Using your belly (abdomen) and pelvic floor muscles to have a bowel movement is usually instinctive.  Sometimes people can have problems with these muscles and have to relearn proper defecation (emptying) techniques.  If you have weakness in your muscles, organs that are falling out, decreased sensation in your pelvis, or ignore your urge to go, you may find yourself straining to have a bowel movement.  You are straining if you are: . holding your breath or taking in a huge gulp of air and holding it  . keeping your lips and jaw tensed and closed tightly . turning red in the face because of excessive pushing or forcing . developing or worsening your  hemorrhoids . getting faint while pushing . not emptying completely and have to defecate many times a day  If you are straining, you are actually making it harder for yourself to have a bowel movement.  Many people find they are pulling up with the pelvic floor muscles and closing off instead of opening the anus. Due to lack pelvic floor relaxation and coordination the abdominal muscles, one has to work harder to push the feces out.  Many people have never been taught how to defecate efficiently and effectively.  Notice what happens to your body when you are having a bowel movement.  While you are sitting on the toilet pay attention to the following areas: . Jaw and mouth position . Angle of your hips   . Whether your feet touch the ground or not . Arm placement  . Spine position . Waist . Belly tension . Anus (opening of the anal canal)  An Evacuation/Defecation Plan   Here are the 4 basic points:  1. Lean forward enough for your elbows to rest on your knees 2. Support your feet on the floor or use a low stool if your feet don't touch the floor  3. Push out your belly as if you have swallowed a beach ball--you should  feel a widening of your waist 4. Open and relax your pelvic floor muscles, rather than tightening around the anus       The following conditions my require modifications to your toileting posture:  . If you have had surgery in the past that limits your back, hip, pelvic, knee or ankle flexibility . Constipation   Your healthcare practitioner may make the following additional suggestions and adjustments:  1) Sit on the toilet  a) Make sure your feet are supported. b) Notice your hip angle and spine position--most people find it effective to lean forward or raise their knees, which can help the muscles around the anus to relax  c) When you lean forward, place your forearms on your thighs for support  2) Relax suggestions a) Breath deeply in through your nose and out slowly through your mouth as if you are smelling the flowers and blowing out the candles. b) To become aware of how to relax your muscles, contracting and releasing muscles can be helpful.  Pull your pelvic floor muscles in tightly by using the image of holding back gas, or closing around the anus (visualize making a circle smaller) and lifting the anus up and in.  Then release the muscles and your anus should drop down and feel open. Repeat 5 times ending with the feeling of relaxation. c) Keep your pelvic floor muscles relaxed; let your belly bulge out. d) The digestive tract starts at  the mouth and ends at the anal opening, so be sure to relax both ends of the tube.  Place your tongue on the roof of your mouth with your teeth separated.  This helps relax your mouth and will help to relax the anus at the same time.  3) Empty (defecation) a) Keep your pelvic floor and sphincter relaxed, then bulge your anal muscles.  Make the anal opening wide.  b) Stick your belly out as if you have swallowed a beach ball. c) Make your belly wall hard using your belly muscles while continuing to breathe. Doing this makes it easier to open your  anus. d) Breath out and give a grunt (or try using other sounds such as ahhhh, shhhhh, ohhhh or grrrrrrr).  4) Finish a) As you finish your bowel movement, pull the pelvic floor muscles up and in.  This will leave your anus in the proper place rather than remaining pushed out and down. If you leave your anus pushed out and down, it will start to feel as though that is normal and give you incorrect signals about needing to have a bowel movement. [facility] [address], [address] [place], [place] [address] Phone # [phone] Fax [phone]

## 2020-08-29 ENCOUNTER — Ambulatory Visit: Payer: Commercial Managed Care - PPO | Admitting: Nurse Practitioner

## 2020-09-11 ENCOUNTER — Ambulatory Visit: Payer: Commercial Managed Care - PPO | Attending: Gastroenterology | Admitting: Physical Therapy

## 2020-09-11 ENCOUNTER — Encounter: Payer: Self-pay | Admitting: Physical Therapy

## 2020-09-11 ENCOUNTER — Other Ambulatory Visit: Payer: Self-pay

## 2020-09-11 DIAGNOSIS — K5902 Outlet dysfunction constipation: Secondary | ICD-10-CM | POA: Diagnosis present

## 2020-09-11 DIAGNOSIS — R278 Other lack of coordination: Secondary | ICD-10-CM | POA: Insufficient documentation

## 2020-09-11 DIAGNOSIS — M6281 Muscle weakness (generalized): Secondary | ICD-10-CM | POA: Diagnosis not present

## 2020-09-11 NOTE — Patient Instructions (Signed)
Access Code: [email or web address] URL: [email or web address] Date: 09/11/2020 Prepared by: Eulis Foster  Exercises Seated Piriformis Stretch with Trunk Bend - 1 x daily - 7 x weekly - 1 sets - 2 reps - 30 sec hold Seated Hamstring Stretch with Chair - 1 x daily - 7 x weekly - 1 sets - 2 reps - 30 sec hold Seated Hip Adductor Stretch - 1 x daily - 7 x weekly - 1 sets - 2 reps - 30 sec hold Seated Hip Flexor Stretch - 1 x daily - 7 x weekly - 1 sets - 2 reps - 30 sec hold Supine Pelvic Floor Stretch - 1 x daily - 7 x weekly - 1 sets - 1 reps - 1 min hold Cat Cow - 1 x daily - 7 x weekly - 1 sets - 10 reps [facility] [address], [address] [place], [place] [address] Phone # [phone] Fax [phone]

## 2020-09-11 NOTE — Therapy (Signed)
[facility] [address], [place], [place], [address] Phone: [phone]   Fax:  [phone]  Physical Therapy Treatment  Patient Details  Name: Jacob Graham MRN: [id number] Date of Birth: July 06, 1971 Referring Provider (PT): Dr. Mauri Pole   Encounter Date: 09/11/2020   PT End of Session - 09/11/20 0843    Visit Number 2    Authorization Type UHC    Authorization - Visit Number 2    Authorization - Number of Visits 60    PT Start Time 0800    PT Stop Time 0841    PT Time Calculation (min) 41 min    Activity Tolerance Patient tolerated treatment well    Behavior During Therapy [facility] for tasks assessed/performed           Past Medical History:  Diagnosis Date  . Asthma   . Diverticulitis   . IBS (irritable bowel syndrome)   . Rectal bleeding   . Wears glasses     Past Surgical History:  Procedure Laterality Date  . ANAL RECTAL MANOMETRY N/A 07/24/2020   Procedure: ANO RECTAL MANOMETRY;  Surgeon: Mauri Pole, MD;  Location: [facility];  Service: Endoscopy;  Laterality: N/A;  . APPENDECTOMY  1999  . colonoscopy  2008, 2014  . CYSTOSCOPY WITH RETROGRADE PYELOGRAM, URETEROSCOPY AND STENT PLACEMENT Bilateral 10/25/2019   Procedure: CYSTOSCOPY WITH BILATERAL RETROGRADE, FIREFLY INJECTION  AND URETERAL CATHETER PLACEMENT;  Surgeon: Alexis Frock, MD;  Location: [facility];  Service: Urology;  Laterality: Bilateral;  . EVALUATION UNDER ANESTHESIA WITH FISTULECTOMY N/A 10/09/2015   Procedure: EXAM UNDER ANESTHESIA; FISTULOTOMY;  Surgeon: Leighton Ruff, MD;  Location: [place];  Service: General;  Laterality: N/A;  . Daisy    There were no vitals filed for this visit.   Subjective Assessment - 09/11/20 0801    Subjective I felt good after last visit. It is a little bit easier to have a bowel movement.    Patient Stated Goals to have a bowel movement without miralax     Currently in Pain? No/denies                             [facility] Adult PT Treatment/Exercise - 09/11/20 0001      Therapeutic Activites    Therapeutic Activities Other Therapeutic Activities    Other Therapeutic Activities education on toileting and how to relax the pelvic floor with breath, correct sounds to relax the pelvic floor, sitting with knees above hips to relax the puborectalis; massage the anus to relax the external sphincter      Neuro Re-ed    Neuro Re-ed Details  diaphragmatic breathing into the pelvic floor to relax the pelvic floor with verbal and tactile cues      Lumbar Exercises: Stretches   Active Hamstring Stretch Right;Left;2 reps;30 seconds    Active Hamstring Stretch Limitations sitting    Hip Flexor Stretch Right;Left;1 rep;30 seconds    Hip Flexor Stretch Limitations sitting    Piriformis Stretch Right;Left;30 seconds;2 reps    Piriformis Stretch Limitations sitting    Other Lumbar Stretch Exercise sitting hip adductor holding 30 sec 1 time each leg    Other Lumbar Stretch Exercise happy baby; sit on a tennis ball to massage the levator ani muscles      Lumbar Exercises: Quadruped   Madcat/Old Horse 10 reps      Manual Therapy  Manual Therapy Soft tissue mobilization;Myofascial release    Soft tissue mobilization scop and up suprapubically then educated patient on how to perform at home    Myofascial Release fascial release around the bladder                  PT Education - 09/11/20 0843    Education Details Access Code: [email or web address]    Methods Explanation;Demonstration;Verbal cues;Handout    Comprehension Verbalized understanding;Returned demonstration            PT Short Term Goals - 08/28/20 1640      PT SHORT TERM GOAL #1   Title independent with intial HEP for stretches    Time 4    Period Weeks    Status New    Target Date 09/25/20      PT SHORT TERM GOAL #2   Title able to bulge the pelvic floor with correct  breathing    Time 4    Period Weeks    Status New    Target Date 09/25/20             PT Long Term Goals - 08/28/20 1641      PT LONG TERM GOAL #1   Title independent with advanced HEP for core strength    Time 12    Period Weeks    Status New    Target Date 11/20/20      PT LONG TERM GOAL #2   Title ability to have a bowel movement without straining and taking 50% less mirilax due to improved pelvic floor coordination    Time 12    Period Weeks    Status New    Target Date 11/20/20      PT LONG TERM GOAL #3   Title bowel movements are Type 4 or 3 with width of thumb size or more due to the ability to expand the anal canal to have a bowel movement    Time 12    Period Weeks    Status New    Target Date 11/20/20      PT LONG TERM GOAL #4   Title when he has the urge to have a bowel movement he is able to have one due to improve coordination of the pelvic floor    Time 12    Period Weeks    Status New    Target Date 11/20/20                 Plan - 09/11/20 0843    Clinical Impression Statement Patient has learned how to diaphragmatic breath to feel the pelvic floor relax then incorporte into toileting. Patient has learned how to toilet correctly. Patient has learned how to massage suprapubically to relax around the bladder and help with urination. Patient just started therapy therefore has not met goals. Patient will benefit from skilled therapy to improve pelvic floor coordination and strength to improve bowel movements.    Personal Factors and Comorbidities Comorbidity 2    Comorbidities fistula around the anus; anal fissures; abominal surgery for diverticultis    Examination-Activity Limitations Toileting    Stability/Clinical Decision Making Stable/Uncomplicated    Rehab Potential Excellent    PT Frequency 1x / week    PT Duration 12 weeks    PT Treatment/Interventions ADLs/Self Care Home Management;Biofeedback;Neuromuscular re-education;Therapeutic  exercise;Therapeutic activities;Patient/family education;Manual techniques;Dry needling;Scar mobilization;Taping    PT Next Visit Plan internal work to the anus and around the urethra; scar massage and education on  how to perform at home; see how toileting is; myofascial work to the suprapubic area    PT Home Exercise Plan Access Code: [email or web address]    Recommended Other Services MD signed intial note    Consulted and Agree with Plan of Care Patient           Patient will benefit from skilled therapeutic intervention in order to improve the following deficits and impairments:  Decreased coordination,Increased fascial restricitons,Decreased strength,Decreased scar mobility,Decreased activity tolerance  Visit Diagnosis: Muscle weakness (generalized)  Other lack of coordination  Dyssynergic defecation  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     Problem List Patient Active Problem List   Diagnosis Date Noted  . 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 Dyssynergic defecation   . Diverticular disease 10/25/2019  . Wears glasses   . IBS (irritable bowel syndrome)   . Rectal bleeding   . Diverticulitis   . Asthma     Earlie Counts, PT 09/11/20 8:48 AM   [facility] [address], [place], [place], [address] Phone: [phone]   Fax:  [phone]  Name: Jacob Graham MRN: [id number] Date of Birth: 29-Dec-1970

## 2020-09-25 ENCOUNTER — Ambulatory Visit: Payer: Commercial Managed Care - PPO | Admitting: Physical Therapy

## 2020-09-25 ENCOUNTER — Encounter: Payer: Self-pay | Admitting: Physical Therapy

## 2020-09-25 ENCOUNTER — Other Ambulatory Visit: Payer: Self-pay

## 2020-09-25 DIAGNOSIS — M6281 Muscle weakness (generalized): Secondary | ICD-10-CM

## 2020-09-25 DIAGNOSIS — K5902 Outlet dysfunction constipation: Secondary | ICD-10-CM

## 2020-09-25 DIAGNOSIS — R278 Other lack of coordination: Secondary | ICD-10-CM

## 2020-09-25 NOTE — Therapy (Signed)
[facility] [address], [address] [place], [place], [address] Phone: [phone]   Fax:  [phone]  Physical Therapy Treatment  Patient Details  Name: Jacob Graham MRN: [id number] Date of Birth: 04-08-1971 Referring Provider (PT): Dr. Napoleon Form   Encounter Date: 09/25/2020   PT End of Session - 09/25/20 1014    Visit Number 3    Date for PT Re-Evaluation 11/20/20    Authorization Type UHC    Authorization - Visit Number 3    Authorization - Number of Visits 60    PT Start Time 0930    PT Stop Time 1010    PT Time Calculation (min) 40 min    Activity Tolerance Patient tolerated treatment well    Behavior During Therapy [facility] for tasks assessed/performed           Past Medical History:  Diagnosis Date  . Asthma   . Diverticulitis   . IBS (irritable bowel syndrome)   . Rectal bleeding   . Wears glasses     Past Surgical History:  Procedure Laterality Date  . ANAL RECTAL MANOMETRY N/A 07/24/2020   Procedure: ANO RECTAL MANOMETRY;  Surgeon: Napoleon Form, MD;  [facility]: [facility];  Service: Endoscopy;  Laterality: N/A;  . APPENDECTOMY  1999  . colonoscopy  2008, 2014  . CYSTOSCOPY WITH RETROGRADE PYELOGRAM, URETEROSCOPY AND STENT PLACEMENT Bilateral 10/25/2019   Procedure: CYSTOSCOPY WITH BILATERAL RETROGRADE, FIREFLY INJECTION  AND URETERAL CATHETER PLACEMENT;  Surgeon: Sebastian Ache, MD;  Location: [facility];  Service: Urology;  Laterality: Bilateral;  . EVALUATION UNDER ANESTHESIA WITH FISTULECTOMY N/A 10/09/2015   Procedure: EXAM UNDER ANESTHESIA; FISTULOTOMY;  Surgeon: Romie Levee, MD;  Location: [facility];  Service: General;  Laterality: N/A;  . KNEE SURGERY  1993    There were no vitals filed for this visit.   Subjective Assessment - 09/25/20 0933    Subjective I have been doing the breathing exercises but not all of the other exercises. When patient does the breathing  and having his feet on a stool is helpful. Patient is still doing the miralax. Toiletingis 20% better. Patient has more problem in the morning. No difficulty with urine stream.    Patient Stated Goals to have a bowel movement without miralax    Currently in Pain? No/denies                             [facility] Adult PT Treatment/Exercise - 09/25/20 0001      Self-Care   Self-Care Other Self-Care Comments    Other Self-Care Comments  dicussion of using Magnesium Citrate and reducing the metamucil to see if the constipation is helped; discussion on when he feels the urge and to do abdominal massage after the first urge then go to the bathroom on the second urge      Neuro Re-ed    Neuro Re-ed Details  diaphragmatic breathing with therapist assisting in lower rib cage downward movement and with mobilzation of the diaphragm.      Lumbar Exercises: Seated   Other Seated Lumbar Exercises sitting with ball squeeze with abdominal contraction using his hands to contract the lower and upper abdominals holding 5 seconds      Lumbar Exercises: Supine   Ab Set 15 reps;5 seconds    AB Set Limitations with ball squeeze, tactile cues to contract the upper and lower abdominals equally; and using a  ball squeeze      Manual Therapy   Manual Therapy Soft tissue mobilization;Myofascial release    Soft tissue mobilization scar massage using the suction cup to release the fascia; manual mobilizaiton to the diaphgram bilaterally,    Myofascial Release fascial release around the bladder; fascial release midline to reduce the tightness; release around the umbilicus                  PT Education - 09/25/20 1014    Education Details Access Code: [email or web address]    Person(s) Educated Patient    Methods Explanation;Demonstration;Verbal cues;Handout    Comprehension Returned demonstration;Verbalized understanding            PT Short Term Goals - 09/25/20 1017      PT SHORT TERM GOAL #1    Title independent with intial HEP for stretches    Time 4    Status Achieved      PT SHORT TERM GOAL #2   Title able to bulge the pelvic floor with correct breathing    Time 4    Period Weeks             PT Long Term Goals - 08/28/20 1641      PT LONG TERM GOAL #1   Title independent with advanced HEP for core strength    Time 12    Period Weeks    Status New    Target Date 11/20/20      PT LONG TERM GOAL #2   Title ability to have a bowel movement without straining and taking 50% less mirilax due to improved pelvic floor coordination    Time 12    Period Weeks    Status New    Target Date 11/20/20      PT LONG TERM GOAL #3   Title bowel movements are Type 4 or 3 with width of thumb size or more due to the ability to expand the anal canal to have a bowel movement    Time 12    Period Weeks    Status New    Target Date 11/20/20      PT LONG TERM GOAL #4   Title when he has the urge to have a bowel movement he is able to have one due to improve coordination of the pelvic floor    Time 12    Period Weeks    Status New    Target Date 11/20/20                 Plan - 09/25/20 1015    Clinical Impression Statement Patient is able to feel his pelvic floor drop. Patient reports he is having 20% greater ease with his bowel movements. He is going to try increasing Magnesium Nitrate and reduce the Metamucil. Patient has increased tissue mobility of the lower abdominal area. He has some difficulty with contracting the lower and upper abdominal equally in sitting and supine. Patient will benefit from skilled therapy to improve pelvic floor coordinaiton and strenght to improve bowel movements.    Personal Factors and Comorbidities Comorbidity 2    Comorbidities fistula around the anus; anal fissures; abominal surgery for diverticultis    Examination-Activity Limitations Toileting    Stability/Clinical Decision Making Stable/Uncomplicated    Rehab Potential Excellent    PT  Frequency 1x / week    PT Duration 12 weeks    PT Treatment/Interventions ADLs/Self Care Home Management;Biofeedback;Neuromuscular re-education;Therapeutic exercise;Therapeutic activities;Patient/family education;Manual techniques;Dry needling;Scar mobilization;Taping  PT Next Visit Plan progress abdominal contractions; work on abdomen and diaphgram    PT Home Exercise Plan Access Code: [email or web address]    Consulted and Agree with Plan of Care Patient           Patient will benefit from skilled therapeutic intervention in order to improve the following deficits and impairments:  Decreased coordination,Increased fascial restricitons,Decreased strength,Decreased scar mobility,Decreased activity tolerance  Visit Diagnosis: Muscle weakness (generalized)  Other lack of coordination  Dyssynergic defecation  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     Problem List Patient Active Problem List   Diagnosis Date Noted  . 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 Dyssynergic defecation   . Diverticular disease 10/25/2019  . Wears glasses   . IBS (irritable bowel syndrome)   . Rectal bleeding   . Diverticulitis   . Asthma     Eulis Foster, PT 09/25/20 10:18 AM   [facility] [address] [place], [place], [address] Phone: [phone]   Fax:  [phone]  Name: SRIANSH FARRA MRN: [id number] Date of Birth: Dec 14, 1970

## 2020-09-25 NOTE — Patient Instructions (Signed)
Access Code: [email or web address] URL: [email or web address] Date: 09/25/2020 Prepared by: Eulis Foster  Exercises Seated Piriformis Stretch with Trunk Bend - 1 x daily - 7 x weekly - 1 sets - 2 reps - 30 sec hold Seated Hamstring Stretch with Chair - 1 x daily - 7 x weekly - 1 sets - 2 reps - 30 sec hold Seated Hip Adductor Stretch - 1 x daily - 7 x weekly - 1 sets - 2 reps - 30 sec hold Seated Hip Flexor Stretch - 1 x daily - 7 x weekly - 1 sets - 2 reps - 30 sec hold Supine Pelvic Floor Stretch - 1 x daily - 7 x weekly - 1 sets - 1 reps - 1 min hold Cat Cow - 1 x daily - 7 x weekly - 1 sets - 10 reps Supine Transversus Abdominis Bracing with Pelvic Floor Contraction - 1 x daily - 7 x weekly - 1 sets - 10 reps - 5 sec hold Seated Transversus Abdominis Bracing - 2 x daily - 7 x weekly - 1 sets - 10 reps - 5 sec hold [facility] [address] [place], [place] [address] Phone # [phone] Fax [phone]

## 2020-10-09 ENCOUNTER — Ambulatory Visit: Payer: Commercial Managed Care - PPO | Attending: Gastroenterology | Admitting: Physical Therapy

## 2020-10-09 ENCOUNTER — Encounter: Payer: Self-pay | Admitting: Physical Therapy

## 2020-10-09 ENCOUNTER — Other Ambulatory Visit: Payer: Self-pay

## 2020-10-09 DIAGNOSIS — M6281 Muscle weakness (generalized): Secondary | ICD-10-CM | POA: Insufficient documentation

## 2020-10-09 DIAGNOSIS — R278 Other lack of coordination: Secondary | ICD-10-CM | POA: Insufficient documentation

## 2020-10-09 DIAGNOSIS — K5902 Outlet dysfunction constipation: Secondary | ICD-10-CM | POA: Insufficient documentation

## 2020-10-09 NOTE — Therapy (Signed)
[facility] [address] [place], [place], [address] Phone: [phone]   Fax:  [phone]  Physical Therapy Treatment  Patient Details  Name: Jacob Graham MRN: [id number] Date of Birth: 05/26/1971 Referring Provider (PT): Dr. Mauri Pole   Encounter Date: 10/09/2020   PT End of Session - 10/09/20 0842    Visit Number 4    Date for PT Re-Evaluation 11/20/20    Authorization Type UHC    Authorization - Visit Number 4    PT Start Time 0800    PT Stop Time 0838    PT Time Calculation (min) 38 min    Activity Tolerance Patient tolerated treatment well    Behavior During Therapy [facility] for tasks assessed/performed           Past Medical History:  Diagnosis Date  . Asthma   . Diverticulitis   . IBS (irritable bowel syndrome)   . Rectal bleeding   . Wears glasses     Past Surgical History:  Procedure Laterality Date  . ANAL RECTAL MANOMETRY N/A 07/24/2020   Procedure: ANO RECTAL MANOMETRY;  Surgeon: Mauri Pole, MD;  Location: [facility];  Service: Endoscopy;  Laterality: N/A;  . APPENDECTOMY  1999  . colonoscopy  2008, 2014  . CYSTOSCOPY WITH RETROGRADE PYELOGRAM, URETEROSCOPY AND STENT PLACEMENT Bilateral 10/25/2019   Procedure: CYSTOSCOPY WITH BILATERAL RETROGRADE, FIREFLY INJECTION  AND URETERAL CATHETER PLACEMENT;  Surgeon: Alexis Frock, MD;  Location: [facility];  Service: Urology;  Laterality: Bilateral;  . EVALUATION UNDER ANESTHESIA WITH FISTULECTOMY N/A 10/09/2015   Procedure: EXAM UNDER ANESTHESIA; FISTULOTOMY;  Surgeon: Leighton Ruff, MD;  Location: [facility];  Service: General;  Laterality: N/A;  . [place]    There were no vitals filed for this visit.   Subjective Assessment - 10/09/20 0802    Subjective I have had more difficulty this week. I am more bloated. I have been eating trail mix and that is different.    Patient Stated Goals to have a bowel  movement without miralax    Currently in Pain? No/denies                             [facility] Adult PT Treatment/Exercise - 10/09/20 0001      Self-Care   Self-Care Other Self-Care Comments    Other Self-Care Comments  education on chewing his food longer to break it up, looking at his diet to see what is a trigger.      Lumbar Exercises: Seated   Other Seated Lumbar Exercises breath into lower rib cage then use hands to bring rib cage downward to engage the upper abdominals and pull his ribs downward      Manual Therapy   Manual Therapy Soft tissue mobilization;Myofascial release    Soft tissue mobilization circular massage to abdomen to improve peristalic motion; manaul work to the diaphragm and lower abdominal scar;    Myofascial Release fascial release to the midline of abdomen, along the bladder and sac of douglas, along the right rectus, pull the tissue from the back to the anterior abdomen                  PT Education - 10/09/20 0842    Education Details sitting and breath out to bring rib cage downward; changing his diet to see what causes bloating    Person(s) Educated Patient  Methods Explanation;Demonstration    Comprehension Verbalized understanding;Returned demonstration            PT Short Term Goals - 09/25/20 1017      PT SHORT TERM GOAL #1   Title independent with intial HEP for stretches    Time 4    Status Achieved      PT SHORT TERM GOAL #2   Title able to bulge the pelvic floor with correct breathing    Time 4    Period Weeks             PT Long Term Goals - 08/28/20 1641      PT LONG TERM GOAL #1   Title independent with advanced HEP for core strength    Time 12    Period Weeks    Status New    Target Date 11/20/20      PT LONG TERM GOAL #2   Title ability to have a bowel movement without straining and taking 50% less mirilax due to improved pelvic floor coordination    Time 12    Period Weeks    Status New     Target Date 11/20/20      PT LONG TERM GOAL #3   Title bowel movements are Type 4 or 3 with width of thumb size or more due to the ability to expand the anal canal to have a bowel movement    Time 12    Period Weeks    Status New    Target Date 11/20/20      PT LONG TERM GOAL #4   Title when he has the urge to have a bowel movement he is able to have one due to improve coordination of the pelvic floor    Time 12    Period Weeks    Status New    Target Date 11/20/20                 Plan - 10/09/20 0842    Clinical Impression Statement Patient has been very bloated this week and could be diet so he will elinmate things to see. Patient had increased tightness in the lower and mid abdomen. Patient had decreased mobility of the diaphragm. Patient had decreased swelling in the abdomen after manual work. Patient has not met goals this week due to being bloated. Patient will benefit from skilled therapy to improve pelvic floor coordination and strength to improve bowel movements.    Personal Factors and Comorbidities Comorbidity 2    Comorbidities fistula around the anus; anal fissures; abominal surgery for diverticultis    Examination-Activity Limitations Toileting    Stability/Clinical Decision Making Stable/Uncomplicated    Rehab Potential Excellent    PT Frequency 1x / week    PT Duration 12 weeks    PT Treatment/Interventions ADLs/Self Care Home Management;Biofeedback;Neuromuscular re-education;Therapeutic exercise;Therapeutic activities;Patient/family education;Manual techniques;Dry needling;Scar mobilization;Taping    PT Next Visit Plan progress abdominal contractions; work on abdomen and diaphgram; abdominal work    PT Home Exercise Plan Access Code: [email or web address]    Consulted and Agree with Plan of Care Patient           Patient will benefit from skilled therapeutic intervention in order to improve the following deficits and impairments:  Decreased coordination,Increased  fascial restricitons,Decreased strength,Decreased scar mobility,Decreased activity tolerance  Visit Diagnosis: Muscle weakness (generalized)  Other lack of coordination  Dyssynergic defecation  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     Problem List Patient Active Problem List  Diagnosis Date Noted  . 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 Dyssynergic defecation   . Diverticular disease 10/25/2019  . Wears glasses   . IBS (irritable bowel syndrome)   . Rectal bleeding   . Diverticulitis   . Asthma     Earlie Counts, PT 10/09/20 8:45 AM    [facility] [address], [place], [place], [address] Phone: [phone]   Fax:  [phone]  Name: Jacob Graham MRN: [id number] Date of Birth: 03-21-1971

## 2020-10-23 ENCOUNTER — Encounter: Payer: Self-pay | Admitting: Physical Therapy

## 2020-10-23 ENCOUNTER — Other Ambulatory Visit: Payer: Self-pay

## 2020-10-23 ENCOUNTER — Ambulatory Visit: Payer: Commercial Managed Care - PPO | Admitting: Physical Therapy

## 2020-10-23 DIAGNOSIS — M6281 Muscle weakness (generalized): Secondary | ICD-10-CM

## 2020-10-23 DIAGNOSIS — K5902 Outlet dysfunction constipation: Secondary | ICD-10-CM

## 2020-10-23 DIAGNOSIS — R278 Other lack of coordination: Secondary | ICD-10-CM

## 2020-10-23 NOTE — Patient Instructions (Addendum)
   OPTP Pro-Roller Soft Density Foam Roller - 36" x 6" - Low Density Foam Roller for Physical Therapy & Exercise  Access Code: [email or web address] URL: [email or web address] Date: 10/23/2020 Prepared by: Eulis Foster  Exercises Seated Piriformis Stretch with Trunk Bend - 1 x daily - 7 x weekly - 1 sets - 2 reps - 30 sec hold Seated Hamstring Stretch with Chair - 1 x daily - 7 x weekly - 1 sets - 2 reps - 30 sec hold Seated Hip Adductor Stretch - 1 x daily - 7 x weekly - 1 sets - 2 reps - 30 sec hold Seated Hip Flexor Stretch - 1 x daily - 7 x weekly - 1 sets - 2 reps - 30 sec hold Supine Pelvic Floor Stretch - 1 x daily - 7 x weekly - 1 sets - 1 reps - 1 min hold Cat Cow - 1 x daily - 7 x weekly - 1 sets - 10 reps Supine Transversus Abdominis Bracing with Pelvic Floor Contraction - 1 x daily - 7 x weekly - 1 sets - 10 reps - 5 sec hold Seated Transversus Abdominis Bracing - 2 x daily - 7 x weekly - 1 sets - 10 reps - 5 sec hold Sidelying IT Band Foam Roll Mobilization - 1 x daily - 7 x weekly - 3 sets - 10 reps Hip Flexor Mobilization with Foam Roll - 1 x daily - 7 x weekly - 3 sets - 10 reps Piriformis Mobilization on Foam Roll - 1 x daily - 7 x weekly - 3 sets - 10 reps Adductor Mobilization with Foam Roll - 1 x daily - 7 x weekly - 3 sets - 10 reps Thoracic Extension with Foam Roll - 1 x daily - 7 x weekly - 3 sets - 10 reps [facility] [address], [address] [place], [place] [address] Phone # [phone] Fax [phone]

## 2020-10-23 NOTE — Therapy (Signed)
[facility] [address], [address] [place], [place], [address] Phone: [phone]   Fax:  [phone]  Physical Therapy Treatment  Patient Details  Name: Jacob Graham MRN: [id number] Date of Birth: 05-Feb-1971 Referring Provider (PT): Dr. Napoleon Form   Encounter Date: 10/23/2020   PT End of Session - 10/23/20 0848    Visit Number 4    Date for PT Re-Evaluation 02/02/21    Authorization Type UHC    Authorization - Visit Number 5    Authorization - Number of Visits 60    PT Start Time 0800    PT Stop Time 0843    PT Time Calculation (min) 43 min    Activity Tolerance Patient tolerated treatment well    Behavior During Therapy [facility] for tasks assessed/performed           Past Medical History:  Diagnosis Date  . Asthma   . Diverticulitis   . IBS (irritable bowel syndrome)   . Rectal bleeding   . Wears glasses     Past Surgical History:  Procedure Laterality Date  . ANAL RECTAL MANOMETRY N/A 07/24/2020   Procedure: ANO RECTAL MANOMETRY;  Surgeon: Napoleon Form, MD;  [facility];  Service: Endoscopy;  Laterality: N/A;  . APPENDECTOMY  1999  . colonoscopy  2008, 2014  . CYSTOSCOPY WITH RETROGRADE PYELOGRAM, URETEROSCOPY AND STENT PLACEMENT Bilateral 10/25/2019   Procedure: CYSTOSCOPY WITH BILATERAL RETROGRADE, FIREFLY INJECTION  AND URETERAL CATHETER PLACEMENT;  Surgeon: Sebastian Ache, MD;  [facility];  Service: Urology;  Laterality: Bilateral;  . EVALUATION UNDER ANESTHESIA WITH FISTULECTOMY N/A 10/09/2015   Procedure: EXAM UNDER ANESTHESIA; FISTULOTOMY;  Surgeon: Romie Levee, MD;  Location: [facility];  Service: General;  Laterality: N/A;  . KNEE SURGERY  1993    There were no vitals filed for this visit.   Subjective Assessment - 10/23/20 0803    Subjective Things are moving better. Increased urge to have a bowel movement and feels like it is stuck at the entrance  and comes out as a ribbon.    Patient Stated Goals to have a bowel movement without miralax    Currently in Pain? No/denies    Multiple Pain Sites No              [facility] PT Assessment - 10/23/20 0001      Assessment   Medical Diagnosis K59.02 constipation due to outlet dysfunction; K59.02 Dyssynerfic defecation    Referring Provider (PT) Dr. Napoleon Form    Onset Date/Surgical Date --   chronic   Prior Therapy none      Precautions   Precautions None      Restrictions   Weight Bearing Restrictions No      Home Environment   Living Environment Private residence      Prior Function   Level of Independence Independent    Vocation Full time employment    Vocation Requirements sitting    Leisure bike, treadmill on incline      Cognition   Overall Cognitive Status Within Functional Limits for tasks assessed      Observation/Other Assessments   Skin Integrity decreased mobility of abdominal scar and suprapubically      Posture/Postural Control   Posture/Postural Control No significant limitations      AROM   Overall AROM Comments lumbar ROM is full      Strength   Overall Strength Comments bil. hip strength is 5/5  Pelvic Floor Special Questions - 10/23/20 0001    Urinary Leakage No    Pelvic Floor Internal Exam Patient confirms identification and approves assessment of pelvic floor and treatment    Exam Type Rectal    Palpation tirgger points in the puborectalis throughout, internal anal sphincter    Strength fair squeeze, definite lift             [facility] Adult PT Treatment/Exercise - 10/23/20 0001      Neuro Re-ed    Neuro Re-ed Details  left sidely bearing down to push the therapist finger out then had patient blow through a circle made by his hand and was able to push her finger out with greater ease      Lumbar Exercises: Stretches   Hip Flexor Stretch Left;Right;60 seconds    Hip Flexor Stretch Limitations prone  on foam roll    Quadruped Mid Back Stretch 1 rep;30 seconds    Quadruped Mid Back Stretch Limitations arms on foam roll    ITB Stretch Right;Left;1 rep;60 seconds    ITB Stretch Limitations sidely    Piriformis Stretch Right;Left;60 seconds    Piriformis Stretch Limitations sitting on foam roll      Manual Therapy   Manual Therapy Internal Pelvic Floor    Internal Pelvic Floor manual mobilizati anterior to the left side of the purborectalis by the pubic bone, and left side of the internal anal sphincter                  PT Education - 10/23/20 0831    Education Details information on foam rolling;Access Code: [email or web address]    Person(s) Educated Patient    Methods Explanation;Demonstration;Verbal cues;Handout    Comprehension Verbalized understanding;Returned demonstration            PT Short Term Goals - 10/23/20 1116      PT SHORT TERM GOAL #1   Title independent with intial HEP for stretches    Time 4    Period Weeks    Status Achieved      PT SHORT TERM GOAL #2   Title able to bulge the pelvic floor with correct breathing    Time 4    Period Weeks    Status Achieved             PT Long Term Goals - 10/23/20 1116      PT LONG TERM GOAL #1   Title independent with advanced HEP for core strength    Baseline still learning    Time 12    Period Weeks    Status On-going      PT LONG TERM GOAL #2   Title ability to have a bowel movement without straining and taking 50% less mirilax due to improved pelvic floor coordination    Time 12    Period Weeks    Status On-going      PT LONG TERM GOAL #3   Title bowel movements are Type 4 or 3 with width of thumb size or more due to the ability to expand the anal canal to have a bowel movement    Time 12    Period Weeks    Status On-going      PT LONG TERM GOAL #4   Title when he has the urge to have a bowel movement he is able to have one due to improve coordination of the pelvic floor    Time 12    Period  Weeks  Status On-going                 Plan - 10/23/20 0850    Clinical Impression Statement Patient had increased constipation when he was eating his nut mixture. Patient reports he has trouble pushing the stool out due to the anal entrance is not relaxing and when the stool comes out it is a ribbon shape. Patient has trigger points in the puborectalis, the internal anal sphincter. When he bears down the puborectalis will tighten but when he blows through a circle made by his hand he is able to open the pelvic floor better and push the therapist finger out of the canal. Patient will benefit from skilled therapy to improve pelvic flooro coordinaiton and strength to improve bowel movements.    Personal Factors and Comorbidities Comorbidity 2    Comorbidities fistula around the anus; anal fissures; abominal surgery for diverticultis    Examination-Activity Limitations Toileting    Stability/Clinical Decision Making Stable/Uncomplicated    Rehab Potential Excellent    PT Frequency 1x / week    PT Duration 12 weeks    PT Treatment/Interventions ADLs/Self Care Home Management;Biofeedback;Neuromuscular re-education;Therapeutic exercise;Therapeutic activities;Patient/family education;Manual techniques;Dry needling;Scar mobilization;Taping    PT Next Visit Plan internal manual work and work on Photographer out    PT Home Exercise Plan Access Code: [email or web address]    Consulted and Agree with Plan of Care Patient           Patient will benefit from skilled therapeutic intervention in order to improve the following deficits and impairments:  Decreased coordination,Increased fascial restricitons,Decreased strength,Decreased scar mobility,Decreased activity tolerance  Visit Diagnosis: Muscle weakness (generalized) - Plan: PT plan of care cert/re-cert  Other lack of coordination - Plan: PT plan of care cert/re-cert  Dyssynergic defecation - Plan: PT plan of care  cert/re-cert  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 - Plan: PT plan of care cert/re-cert     Problem List Patient Active Problem List   Diagnosis Date Noted  . 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 Dyssynergic defecation   . Diverticular disease 10/25/2019  . Wears glasses   . IBS (irritable bowel syndrome)   . Rectal bleeding   . Diverticulitis   . Asthma     Eulis Foster, PT 10/23/20 11:23 AM   [facility] [address] [place], [place], [address] Phone: [phone]   Fax:  [phone]  Name: Jacob Graham MRN: [id number] Date of Birth: 1971-01-28

## 2020-11-22 ENCOUNTER — Other Ambulatory Visit: Payer: Self-pay

## 2020-11-22 ENCOUNTER — Ambulatory Visit: Payer: Commercial Managed Care - PPO | Attending: Gastroenterology | Admitting: Physical Therapy

## 2020-11-22 ENCOUNTER — Encounter: Payer: Self-pay | Admitting: Physical Therapy

## 2020-11-22 DIAGNOSIS — M6281 Muscle weakness (generalized): Secondary | ICD-10-CM | POA: Diagnosis not present

## 2020-11-22 DIAGNOSIS — K5902 Outlet dysfunction constipation: Secondary | ICD-10-CM | POA: Insufficient documentation

## 2020-11-22 DIAGNOSIS — R278 Other lack of coordination: Secondary | ICD-10-CM | POA: Insufficient documentation

## 2020-11-22 NOTE — Therapy (Signed)
[facility] [address], [address] [place], [place], [address] Phone: [phone]   Fax:  [phone]  Physical Therapy Treatment  Patient Details  Name: Jacob Graham MRN: [id number] Date of Birth: 01/25/1971 Referring Provider (PT): Dr. Napoleon Form   Encounter Date: 11/22/2020   PT End of Session - 11/22/20 0933    Visit Number 5    Date for PT Re-Evaluation 02/02/21    Authorization Type UHC    Authorization - Visit Number 6    Authorization - Number of Visits 60    PT Start Time 0845    PT Stop Time 0930    PT Time Calculation (min) 45 min    Activity Tolerance Patient tolerated treatment well;No increased pain    Behavior During Therapy [facility] for tasks assessed/performed           Past Medical History:  Diagnosis Date  . Asthma   . Diverticulitis   . IBS (irritable bowel syndrome)   . Rectal bleeding   . Wears glasses     Past Surgical History:  Procedure Laterality Date  . ANAL RECTAL MANOMETRY N/A 07/24/2020   Procedure: ANO RECTAL MANOMETRY;  Surgeon: Napoleon Form, MD;  [facility];  Service: Endoscopy;  Laterality: N/A;  . APPENDECTOMY  1999  . colonoscopy  2008, 2014  . CYSTOSCOPY WITH RETROGRADE PYELOGRAM, URETEROSCOPY AND STENT PLACEMENT Bilateral 10/25/2019   Procedure: CYSTOSCOPY WITH BILATERAL RETROGRADE, FIREFLY INJECTION  AND URETERAL CATHETER PLACEMENT;  Surgeon: Sebastian Ache, MD;  [facility];  Service: Urology;  Laterality: Bilateral;  . EVALUATION UNDER ANESTHESIA WITH FISTULECTOMY N/A 10/09/2015   Procedure: EXAM UNDER ANESTHESIA; FISTULOTOMY;  Surgeon: Romie Levee, MD;  Location: [facility];  Service: General;  Laterality: N/A;  . KNEE SURGERY  1993    There were no vitals filed for this visit.   Subjective Assessment - 11/22/20 0853    Subjective More good days than bad days. I still have ribbon stools. Still do the stretches. I been  having doing a morning drink and has helped with bowel movements.    Patient Stated Goals to have a bowel movement without miralax    Currently in Pain? No/denies                          Pelvic Floor Special Questions - 11/22/20 0001    Pelvic Floor Internal Exam Patient confirms identification and approves assessment of pelvic floor and treatment    Exam Type Rectal    Palpation trigger points in the puborectlais, anococcygeal ligament, along the levator ani    Strength fair squeeze, definite lift             [facility] Adult PT Treatment/Exercise - 11/22/20 0001      Manual Therapy   Manual Therapy Soft tissue mobilization;Internal Pelvic Floor    Soft tissue mobilization using the addaday to massage the gluteal, hamstring, to lengthen the muscles    Internal Pelvic Floor fascial work to the levator ani, anococcygeal ligament, mobilize the coccyx, relesae the trigger points in the area                    PT Short Term Goals - 10/23/20 1116      PT SHORT TERM GOAL #1   Title independent with intial HEP for stretches    Time 4    Period Weeks    Status Achieved  PT SHORT TERM GOAL #2   Title able to bulge the pelvic floor with correct breathing    Time 4    Period Weeks    Status Achieved             PT Long Term Goals - 11/22/20 2025      PT LONG TERM GOAL #1   Title independent with advanced HEP for core strength    Time 12    Period Weeks    Status On-going      PT LONG TERM GOAL #2   Title ability to have a bowel movement without straining and taking 50% less mirilax due to improved pelvic floor coordination    Time 12    Period Weeks    Status On-going      PT LONG TERM GOAL #3   Title bowel movements are Type 4 or 3 with width of thumb size or more due to the ability to expand the anal canal to have a bowel movement    Time 12    Period Weeks    Status On-going      PT LONG TERM GOAL #4   Title when he has the urge to  have a bowel movement he is able to have one due to improve coordination of the pelvic floor    Time 12    Period Weeks    Status On-going                 Plan - 11/22/20 0934    Clinical Impression Statement Patient had many trigger points in the levator ani, anococcygeal ligament, and tightness in the coccyx. Patient was able to bulge the pelvic floor some with therapist guiding the pelvic floor downward. Patient will continue to need manual work to the pelvic floor due to trigger points. Patient reports he is having more good days than bad days. He has increased in coccyx movement. Patient reports his stool are ribbons. Patient will benefit from skilled therapy to improve pelvic floor coordination and strength to improve pushing out bowel movements.    Personal Factors and Comorbidities Comorbidity 2    Comorbidities fistula around the anus; anal fissures; abominal surgery for diverticultis    Examination-Activity Limitations Toileting    Stability/Clinical Decision Making Stable/Uncomplicated    Rehab Potential Excellent    PT Frequency 1x / week    PT Duration 12 weeks    PT Treatment/Interventions ADLs/Self Care Home Management;Biofeedback;Neuromuscular re-education;Therapeutic exercise;Therapeutic activities;Patient/family education;Manual techniques;Dry needling;Scar mobilization;Taping    PT Next Visit Plan internal manual work and work on Higher education careers adviser finger out; dry needling to the puborectalis, levator ani, coccygeus    PT Home Exercise Plan Access Code: [email or web address]    Recommended Other Services initial and renewal note signed    Consulted and Agree with Plan of Care Patient           Patient will benefit from skilled therapeutic intervention in order to improve the following deficits and impairments:  Decreased coordination,Increased fascial restricitons,Decreased strength,Decreased scar mobility,Decreased activity tolerance  Visit Diagnosis: Muscle weakness  (generalized)  Other lack of coordination  Dyssynergic defecation  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     Problem List Patient Active Problem List   Diagnosis Date Noted  . 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 Dyssynergic defecation   . Diverticular disease 10/25/2019  . Wears glasses   . IBS (irritable bowel syndrome)   . Rectal bleeding   . Diverticulitis   .  Asthma     Eulis Foster, PT 11/22/20 9:39 AM   [facility] [address] [place], [place], [address] Phone: [phone]   Fax:  [phone]  Name: Jacob Graham MRN: [id number] Date of Birth: [birth year]

## 2020-12-04 ENCOUNTER — Ambulatory Visit: Payer: Commercial Managed Care - PPO | Attending: Gastroenterology | Admitting: Physical Therapy

## 2020-12-04 ENCOUNTER — Other Ambulatory Visit: Payer: Self-pay

## 2020-12-04 ENCOUNTER — Encounter: Payer: Self-pay | Admitting: Physical Therapy

## 2020-12-04 DIAGNOSIS — M6281 Muscle weakness (generalized): Secondary | ICD-10-CM | POA: Insufficient documentation

## 2020-12-04 DIAGNOSIS — K5902 Outlet dysfunction constipation: Secondary | ICD-10-CM | POA: Insufficient documentation

## 2020-12-04 DIAGNOSIS — R278 Other lack of coordination: Secondary | ICD-10-CM | POA: Insufficient documentation

## 2020-12-04 NOTE — Therapy (Signed)
[facility] [address], [address] [place], [place], [address] Phone: [phone]   Fax:  [phone]  Physical Therapy Treatment  Patient Details  Name: Jacob Graham MRN: [id number] Date of Birth: 1970-08-07 Referring Provider (PT): Dr. Napoleon Form   Encounter Date: 12/04/2020   PT End of Session - 12/04/20 0842    Visit Number 6    Date for PT Re-Evaluation 02/02/21    Authorization Type UHC    Authorization - Visit Number 7    Authorization - Number of Visits 60    PT Start Time 0800    PT Stop Time 0842    PT Time Calculation (min) 42 min    Activity Tolerance Patient tolerated treatment well;No increased pain    Behavior During Therapy WFL for tasks assessed/performed           Past Medical History:  Diagnosis Date  . Asthma   . Diverticulitis   . IBS (irritable bowel syndrome)   . Rectal bleeding   . Wears glasses     Past Surgical History:  Procedure Laterality Date  . ANAL RECTAL MANOMETRY N/A 07/24/2020   Procedure: ANO RECTAL MANOMETRY;  Surgeon: Napoleon Form, MD;  Location: [facility];  Service: Endoscopy;  Laterality: N/A;  . APPENDECTOMY  1999  . colonoscopy  2008, 2014  . CYSTOSCOPY WITH RETROGRADE PYELOGRAM, URETEROSCOPY AND STENT PLACEMENT Bilateral 10/25/2019   Procedure: CYSTOSCOPY WITH BILATERAL RETROGRADE, FIREFLY INJECTION  AND URETERAL CATHETER PLACEMENT;  Surgeon: Sebastian Ache, MD;  Location: [facility];  Service: Urology;  Laterality: Bilateral;  . EVALUATION UNDER ANESTHESIA WITH FISTULECTOMY N/A 10/09/2015   Procedure: EXAM UNDER ANESTHESIA; FISTULOTOMY;  Surgeon: Romie Levee, MD;  Location: [facility];  Service: General;  Laterality: N/A;  . KNEE SURGERY  1993    There were no vitals filed for this visit.   Subjective Assessment - 12/04/20 0802    Subjective I was sore for 2 days after last visit in the area you were working. I sitll have the ribbon  stool.    Patient Stated Goals to have a bowel movement without miralax    Currently in Pain? No/denies                          Pelvic Floor Special Questions - 12/04/20 0001    Pelvic Floor Internal Exam Patient confirms identification and approves assessment of pelvic floor and treatment    Exam Type Rectal             [facility] Adult PT Treatment/Exercise - 12/04/20 0001      Neuro Re-ed    Neuro Re-ed Details  sidely bulging to the anus to push the therapist finger out of the anal canal with breath      Manual Therapy   Manual Therapy Internal Pelvic Floor    Manual therapy comments to assess for dry needling    Internal Pelvic Floor manual work to the puborectalis, coccygeus, pubococcygeus to elongate the muscles after dry needling            Trigger Point Dry Needling - 12/04/20 0001    Consent Given? Yes    Education Handout Provided Yes    Other Dry Needling pborectalis, pubococcygeus, coccygeus   trigger point response, elongation of muscles               PT Education - 12/04/20 0841    Education Details  information on dry needling    Person(s) Educated Patient    Methods Explanation;Handout    Comprehension Verbalized understanding            PT Short Term Goals - 10/23/20 1116      PT SHORT TERM GOAL #1   Title independent with intial HEP for stretches    Time 4    Period Weeks    Status Achieved      PT SHORT TERM GOAL #2   Title able to bulge the pelvic floor with correct breathing    Time 4    Period Weeks    Status Achieved             PT Long Term Goals - 11/22/20 3474      PT LONG TERM GOAL #1   Title independent with advanced HEP for core strength    Time 12    Period Weeks    Status On-going      PT LONG TERM GOAL #2   Title ability to have a bowel movement without straining and taking 50% less mirilax due to improved pelvic floor coordination    Time 12    Period Weeks    Status On-going      PT LONG  TERM GOAL #3   Title bowel movements are Type 4 or 3 with width of thumb size or more due to the ability to expand the anal canal to have a bowel movement    Time 12    Period Weeks    Status On-going      PT LONG TERM GOAL #4   Title when he has the urge to have a bowel movement he is able to have one due to improve coordination of the pelvic floor    Time 12    Period Weeks    Status On-going                 Plan - 12/04/20 0845    Clinical Impression Statement Patient continues to have ribbon stools. He did well with dry needling. The puborectalis and pubococcygeus elongated after the dry needling and less pain with palpation. Patient still has some difficulty with bulging the pelvic floor with breath. Patient will benefit from skilled therapy to improve coordination and length of the pelvic floor.    Personal Factors and Comorbidities Comorbidity 2    Comorbidities fistula around the anus; anal fissures; abominal surgery for diverticultis    Examination-Activity Limitations Toileting    Stability/Clinical Decision Making Stable/Uncomplicated    Rehab Potential Excellent    PT Frequency 1x / week    PT Duration 12 weeks    PT Treatment/Interventions ADLs/Self Care Home Management;Biofeedback;Neuromuscular re-education;Therapeutic exercise;Therapeutic activities;Patient/family education;Manual techniques;Dry needling;Scar mobilization;Taping    PT Next Visit Plan assess dry needling, fascial work on the hamstring, quads, and gulteals    PT Home Exercise Plan Access Code: [email or web address]    Consulted and Agree with Plan of Care Patient           Patient will benefit from skilled therapeutic intervention in order to improve the following deficits and impairments:  Decreased coordination,Increased fascial restricitons,Decreased strength,Decreased scar mobility,Decreased activity tolerance  Visit Diagnosis: Muscle weakness (generalized)  Other lack of coordination  Dyssynergic  defecation  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     Problem List Patient Active Problem List   Diagnosis Date Noted  . 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 Dyssynergic defecation   . Diverticular disease  10/25/2019  . Wears glasses   . IBS (irritable bowel syndrome)   . Rectal bleeding   . Diverticulitis   . Asthma     Eulis Foster, PT 12/04/20 8:48 AM   [facility] [address] [place], [place], [address] Phone: [phone]   Fax:  [phone]  Name: Jacob Graham MRN: [id number] Date of Birth: 11/25/1970

## 2020-12-04 NOTE — Patient Instructions (Addendum)

## 2020-12-18 ENCOUNTER — Other Ambulatory Visit: Payer: Self-pay

## 2020-12-18 ENCOUNTER — Ambulatory Visit: Payer: Commercial Managed Care - PPO | Admitting: Physical Therapy

## 2020-12-18 ENCOUNTER — Encounter: Payer: Self-pay | Admitting: Physical Therapy

## 2020-12-18 DIAGNOSIS — R278 Other lack of coordination: Secondary | ICD-10-CM

## 2020-12-18 DIAGNOSIS — M6281 Muscle weakness (generalized): Secondary | ICD-10-CM | POA: Diagnosis not present

## 2020-12-18 DIAGNOSIS — K5902 Outlet dysfunction constipation: Secondary | ICD-10-CM

## 2020-12-18 NOTE — Therapy (Signed)
[facility] [address], [address] [place], [place], [address] Phone: [phone]   Fax:  [phone]  Physical Therapy Treatment  Patient Details  Name: Jacob Graham MRN: [id number] Date of Birth: 27-Aug-1970 Referring Provider (PT): Dr. Napoleon Form      Encounter Date: 12/18/2020   PT End of Session - 12/18/20 0849     Visit Number 7    Date for PT Re-Evaluation 02/02/21    Authorization Type UHC    Authorization - Visit Number 8    Authorization - Number of Visits 60    PT Start Time 0800    PT Stop Time 0842    PT Time Calculation (min) 42 min    Activity Tolerance Patient tolerated treatment well;No increased pain    Behavior During Therapy WFL for tasks assessed/performed             Past Medical History:  Diagnosis Date   Asthma    Diverticulitis    IBS (irritable bowel syndrome)    Rectal bleeding    Wears glasses     Past Surgical History:  Procedure Laterality Date   ANAL RECTAL MANOMETRY N/A 07/24/2020   Procedure: ANO RECTAL MANOMETRY;  Surgeon: Napoleon Form, MD;  Location: [facility];  Service: Endoscopy;  Laterality: N/A;   APPENDECTOMY  1999   colonoscopy  2008, 2014   CYSTOSCOPY WITH RETROGRADE PYELOGRAM, URETEROSCOPY AND STENT PLACEMENT Bilateral 10/25/2019   Procedure: CYSTOSCOPY WITH BILATERAL RETROGRADE, FIREFLY INJECTION  AND URETERAL CATHETER PLACEMENT;  Surgeon: Sebastian Ache, MD;  Location: [facility];  Service: Urology;  Laterality: Bilateral;   EVALUATION UNDER ANESTHESIA WITH FISTULECTOMY N/A 10/09/2015   Procedure: EXAM UNDER ANESTHESIA; FISTULOTOMY;  Surgeon: Romie Levee, MD;  Location: [facility];  Service: General;  Laterality: N/A;   KNEE SURGERY  1993    There were no vitals filed for this visit.   Subjective Assessment - 12/18/20 0809     Subjective Stools are stool ribbons. I have increased the Mirlax. Some days I feel the pelvic floor  open but htere is something blocking the stool. After the dry needling I am able to have the muscle relax easier.    Patient Stated Goals to have a bowel movement without miralax    Currently in Pain? No/denies                            Pelvic Floor Special Questions - 12/18/20 0001     Pelvic Floor Internal Exam Patient confirms identification and approves assessment of pelvic floor and treatment    Exam Type Rectal               [facility] Adult PT Treatment/Exercise - 12/18/20 0001       Lumbar Exercises: Standing   Other Standing Lumbar Exercises standing pelvic tilt with lower abdominal contraction and not clenching his buttocks    Other Standing Lumbar Exercises standing squating with glolf club as a bar working on technique, keeping spinal neutral,      Lumbar Exercises: Supine   Other Supine Lumbar Exercises supine with feet on stool and engaging the hamstring but keep the buttocks relaxed      Manual Therapy   Manual Therapy Internal Pelvic Floor    Manual therapy comments to assess for dry needling    Internal Pelvic Floor manual work to the pubococcygeus and rectalis to elongate the muscle after dry  needling while feeling the release through the tissue              Trigger Point Dry Needling - 12/18/20 0001     Consent Given? Yes    Education Handout Provided Previously provided    Other Dry Needling puborectalis, pubococcygeus, coccygeus   trigger point response, elongation of muscles                 PT Education - 12/18/20 1116     Education Details education on squat lifts, standing pelvic tilt to engage the abdominals instead of the gluteals    Person(s) Educated Patient    Methods Explanation;Demonstration    Comprehension Returned demonstration;Verbalized understanding              PT Short Term Goals - 10/23/20 1116       PT SHORT TERM GOAL #1   Title independent with intial HEP for stretches    Time 4    Period  Weeks    Status Achieved      PT SHORT TERM GOAL #2   Title able to bulge the pelvic floor with correct breathing    Time 4    Period Weeks    Status Achieved               PT Long Term Goals - 12/18/20 1121       PT LONG TERM GOAL #1   Title independent with advanced HEP for core strength    Baseline still learning    Time 12    Period Weeks    Status On-going      PT LONG TERM GOAL #2   Title ability to have a bowel movement without straining and taking 50% less mirilax due to improved pelvic floor coordination    Baseline increased his miralax    Time 12    Period Weeks    Status On-going      PT LONG TERM GOAL #3   Title bowel movements are Type 4 or 3 with width of thumb size or more due to the ability to expand the anal canal to have a bowel movement    Baseline still ribbon form    Time 12    Period Weeks    Status On-going      PT LONG TERM GOAL #4   Title when he has the urge to have a bowel movement he is able to have one due to improve coordination of the pelvic floor    Time 12    Period Weeks    Status On-going                   Plan - 12/18/20 1117     Clinical Impression Statement Stool is still ribbon shape. Patient reports he is able to feel when he is tightening his anus in standing when it should be relaxed. He has increased his Miralax to help with bowel movements. He is able to do the diaphragmatic breathing and relax for a bowel movement but at times feels like something is blocking it. Upon internal palpation of the anal canal there was tissue that would flip side to side at the anterior left side. His main tightness is in the puborectalis and coccygeus and continues to need more manual work. Patient went to his parents and did not have the issues he used to. Patient will benefit from skilled therapy to improve coordinaiton and length of the pelvic floor.  Personal Factors and Comorbidities Comorbidity 2    Comorbidities fistula  around the anus; anal fissures; abominal surgery for diverticultis    Examination-Activity Limitations Toileting    Stability/Clinical Decision Making Stable/Uncomplicated    Rehab Potential Excellent    PT Frequency 1x / week    PT Duration 12 weeks    PT Treatment/Interventions ADLs/Self Care Home Management;Biofeedback;Neuromuscular re-education;Therapeutic exercise;Therapeutic activities;Patient/family education;Manual techniques;Dry needling;Scar mobilization;Taping    PT Next Visit Plan dry needling to the pelvic floor, internal work to the [place], continue to work on work out posture; ask about the urge to have a bowel movement    PT Home Exercise Plan Access Code: [email or web address]    Consulted and Agree with Plan of Care Patient             Patient will benefit from skilled therapeutic intervention in order to improve the following deficits and impairments:  Decreased coordination, Increased fascial restricitons, Decreased strength, Decreased scar mobility, Decreased activity tolerance  Visit Diagnosis: Muscle weakness (generalized)  Other lack of coordination  Dyssynergic defecation  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     Problem List Patient Active Problem List   Diagnosis Date Noted   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    Dyssynergic defecation    Diverticular disease 10/25/2019   Wears glasses    IBS (irritable bowel syndrome)    Rectal bleeding    Diverticulitis    Asthma     Eulis Foster, PT 12/18/20 11:23 AM  [facility] [address] [place], [place], [address] Phone: [phone]   Fax:  [phone]  Name: ESTEN DOLLAR MRN: [id number] Date of Birth: 1970/10/23

## 2021-01-13 ENCOUNTER — Ambulatory Visit: Payer: Commercial Managed Care - PPO | Admitting: Physical Therapy

## 2021-01-13 ENCOUNTER — Encounter: Payer: Self-pay | Admitting: Physical Therapy

## 2021-01-24 ENCOUNTER — Ambulatory Visit: Payer: Commercial Managed Care - PPO | Attending: Gastroenterology | Admitting: Physical Therapy

## 2021-01-24 ENCOUNTER — Other Ambulatory Visit: Payer: Self-pay

## 2021-01-24 ENCOUNTER — Encounter: Payer: Self-pay | Admitting: Physical Therapy

## 2021-01-24 DIAGNOSIS — K5902 Outlet dysfunction constipation: Secondary | ICD-10-CM | POA: Diagnosis present

## 2021-01-24 DIAGNOSIS — M6281 Muscle weakness (generalized): Secondary | ICD-10-CM | POA: Insufficient documentation

## 2021-01-24 DIAGNOSIS — R278 Other lack of coordination: Secondary | ICD-10-CM | POA: Diagnosis present

## 2021-01-24 NOTE — Therapy (Signed)
[facility] [address], [address] [place], [place], [address] Phone: [phone]   Fax:  [phone]  Physical Therapy Treatment  Patient Details  Name: Jacob Graham MRN: [id number] Date of Birth: 08-20-1970 Referring Provider (PT): Dr. Napoleon Form   Encounter Date: 01/24/2021   PT End of Session - 01/24/21 0840     Visit Number 8    Date for PT Re-Evaluation 04/27/21    Authorization Type UHC    Authorization - Visit Number 9    Authorization - Number of Visits 60    PT Start Time 0800    PT Stop Time 0842    PT Time Calculation (min) 42 min    Activity Tolerance Patient tolerated treatment well;No increased pain    Behavior During Therapy WFL for tasks assessed/performed             Past Medical History:  Diagnosis Date   Asthma    Diverticulitis    IBS (irritable bowel syndrome)    Rectal bleeding    Wears glasses     Past Surgical History:  Procedure Laterality Date   ANAL RECTAL MANOMETRY N/A 07/24/2020   Procedure: ANO RECTAL MANOMETRY;  Surgeon: Napoleon Form, MD;  Location: [facility];  Service: Endoscopy;  Laterality: N/A;   APPENDECTOMY  1999   colonoscopy  2008, 2014   CYSTOSCOPY WITH RETROGRADE PYELOGRAM, URETEROSCOPY AND STENT PLACEMENT Bilateral 10/25/2019   Procedure: CYSTOSCOPY WITH BILATERAL RETROGRADE, FIREFLY INJECTION  AND URETERAL CATHETER PLACEMENT;  Surgeon: Sebastian Ache, MD;  Location: [facility];  Service: Urology;  Laterality: Bilateral;   EVALUATION UNDER ANESTHESIA WITH FISTULECTOMY N/A 10/09/2015   Procedure: EXAM UNDER ANESTHESIA; FISTULOTOMY;  Surgeon: Romie Levee, MD;  Location: [facility];  Service: General;  Laterality: N/A;   KNEE SURGERY  1993    There were no vitals filed for this visit.   Subjective Assessment - 01/24/21 0802     Subjective I am getting more good days. still have ribbon stool and need the miralax.    Patient Stated  Goals to have a bowel movement without miralax    Currently in Pain? No/denies                [facility] PT Assessment - 01/24/21 0001       Assessment   Medical Diagnosis K59.02 constipation due to outlet dysfunction; K59.02 Dyssynerfic defecation    Referring Provider (PT) Dr. Napoleon Form    Onset Date/Surgical Date --   chronic   Prior Therapy none      Precautions   Precautions None      Restrictions   Weight Bearing Restrictions No      Home Environment   Living Environment Private residence      Prior Function   Level of Independence Independent    Vocation Full time employment    Vocation Requirements sitting    Leisure bike, treadmill on incline      Cognition   Overall Cognitive Status Within Functional Limits for tasks assessed      Observation/Other Assessments   Skin Integrity decreased mobility of abdominal scar and suprapubically      Posture/Postural Control   Posture/Postural Control No significant limitations      Strength   Overall Strength Comments bil. hip strength is 5/5                        Pelvic Floor Special  Questions - 01/24/21 0001     Urinary Leakage No    Pelvic Floor Internal Exam Patient confirms identification and approves assessment of pelvic floor and treatment    Exam Type Rectal    Palpation tightness in the puborectalis, coccygeus, anal sphincter; difficult to get the therapist index finger fully into the anal canal    Strength fair squeeze, definite lift               [facility] Adult PT Treatment/Exercise - 01/24/21 0001       Self-Care   Self-Care Other Self-Care Comments    Other Self-Care Comments  educated patient on the use of anal dilators, where to purchase them, how they are used and why they are used      Manual Therapy   Manual Therapy Internal Pelvic Floor    Manual therapy comments to assess for dry needling    Internal Pelvic Floor manual work to the pubococcygeus and rectalis to  elongate the muscle after dry needling while feeling the release through the tissue              Trigger Point Dry Needling - 01/24/21 0001     Consent Given? Yes    Education Handout Provided Previously provided    Other Dry Needling puborectalis, pubococcygeus, coccygeus   trigger point response, elongation of muscles                 PT Education - 01/24/21 0935     Education Details educated patient on rectal dilators and where to purchase them    Person(s) Educated Patient    Methods Explanation;Handout    Comprehension Verbalized understanding              PT Short Term Goals - 10/23/20 1116       PT SHORT TERM GOAL #1   Title independent with intial HEP for stretches    Time 4    Period Weeks    Status Achieved      PT SHORT TERM GOAL #2   Title able to bulge the pelvic floor with correct breathing    Time 4    Period Weeks    Status Achieved               PT Long Term Goals - 01/24/21 0950       PT LONG TERM GOAL #1   Title independent with advanced HEP for core strength    Baseline still learning    Time 12    Period Weeks    Status On-going      PT LONG TERM GOAL #2   Title ability to have a bowel movement without straining and taking 50% less mirilax due to improved pelvic floor coordination    Time 12    Period Weeks    Status On-going      PT LONG TERM GOAL #3   Title bowel movements are Type 4 or 3 with width of thumb size or more due to the ability to expand the anal canal to have a bowel movement    Baseline still ribbon form    Time 12    Period Weeks    Status On-going      PT LONG TERM GOAL #4   Title when he has the urge to have a bowel movement he is able to have one due to improve coordination of the pelvic floor    Time 12    Period Weeks  Status On-going                   Plan - 01/24/21 0937     Clinical Impression Statement Patient continues to have ribbon formed stool. Patient is able to  relax his pelvic floor with bowel movements most of the time. Patient continues to have to strain at times and still taking his Miralax. He reports he is taking less Miralax. Patient reponds well to dry needling with the muscles are relaxed. Therapist had difficulty with placing her index finger into the the anal canal fully due to the tightness of the pelvic floor muscles. Patient was educated on anal dilators to expand the anal canal to improve the diameter of his stool. Patient would benefit from skilled therapy to improve the diameter of the anal canal to improve the quality of the stool and relaxation of the pelvic floor muscles.    Personal Factors and Comorbidities Comorbidity 2    Comorbidities fistula around the anus; anal fissures; abominal surgery for diverticultis    Examination-Activity Limitations Toileting    Stability/Clinical Decision Making Stable/Uncomplicated    Rehab Potential Excellent    PT Frequency 1x / week    PT Duration 12 weeks    PT Treatment/Interventions ADLs/Self Care Home Management;Biofeedback;Neuromuscular re-education;Therapeutic exercise;Therapeutic activities;Patient/family education;Manual techniques;Dry needling;Scar mobilization;Taping    PT Next Visit Plan dry needling to the pelvic floor, internal work to the anus,  ask about the urge to have a bowel movement; ask about the anal dilators    PT Home Exercise Plan Access Code: [email or web address]    Recommended Other Services renewal note sent    Consulted and Agree with Plan of Care Patient             Patient will benefit from skilled therapeutic intervention in order to improve the following deficits and impairments:  Decreased coordination, Increased fascial restricitons, Decreased strength, Decreased scar mobility, Decreased activity tolerance  Visit Diagnosis: Muscle weakness (generalized)  Other lack of coordination  Dyssynergic defecation  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     Problem  List Patient Active Problem List   Diagnosis Date Noted   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    Dyssynergic defecation    Diverticular disease 10/25/2019   Wears glasses    IBS (irritable bowel syndrome)    Rectal bleeding    Diverticulitis    Asthma     Eulis Foster, PT 01/24/21 9:51 AM  [facility] [address] [place], [place], [address] Phone: [phone]   Fax:  [phone]  Name: Jacob Graham MRN: [id number] Date of Birth: Nov 12, 1970

## 2021-02-07 ENCOUNTER — Encounter: Payer: Self-pay | Admitting: Physical Therapy

## 2021-02-07 ENCOUNTER — Other Ambulatory Visit: Payer: Self-pay

## 2021-02-07 ENCOUNTER — Ambulatory Visit: Payer: Commercial Managed Care - PPO | Attending: Gastroenterology | Admitting: Physical Therapy

## 2021-02-07 DIAGNOSIS — M6281 Muscle weakness (generalized): Secondary | ICD-10-CM | POA: Insufficient documentation

## 2021-02-07 DIAGNOSIS — K5902 Outlet dysfunction constipation: Secondary | ICD-10-CM | POA: Diagnosis present

## 2021-02-07 DIAGNOSIS — R278 Other lack of coordination: Secondary | ICD-10-CM | POA: Insufficient documentation

## 2021-02-07 NOTE — Therapy (Signed)
[facility] [address] [place], [place], [address] Phone: [phone]   Fax:  [phone]  Physical Therapy Treatment  Patient Details  Name: Jacob Graham MRN: [id number] Date of Birth: 01-01-1971 Referring Provider (PT): Dr. Napoleon Form   Encounter Date: 02/07/2021   PT End of Session - 02/07/21 0844     Visit Number 10    Date for PT Re-Evaluation 04/27/21    Authorization Type UHC    Authorization - Visit Number 10    Authorization - Number of Visits 60    PT Start Time 0800    PT Stop Time 0843    PT Time Calculation (min) 43 min    Activity Tolerance Patient tolerated treatment well;No increased pain    Behavior During Therapy WFL for tasks assessed/performed             Past Medical History:  Diagnosis Date   Asthma    Diverticulitis    IBS (irritable bowel syndrome)    Rectal bleeding    Wears glasses     Past Surgical History:  Procedure Laterality Date   ANAL RECTAL MANOMETRY N/A 07/24/2020   Procedure: ANO RECTAL MANOMETRY;  Surgeon: Napoleon Form, MD;  Location: [facility];  Service: Endoscopy;  Laterality: N/A;   APPENDECTOMY  1999   colonoscopy  2008, 2014   CYSTOSCOPY WITH RETROGRADE PYELOGRAM, URETEROSCOPY AND STENT PLACEMENT Bilateral 10/25/2019   Procedure: CYSTOSCOPY WITH BILATERAL RETROGRADE, FIREFLY INJECTION  AND URETERAL CATHETER PLACEMENT;  Surgeon: Sebastian Ache, MD;  Location: [facility];  Service: Urology;  Laterality: Bilateral;   EVALUATION UNDER ANESTHESIA WITH FISTULECTOMY N/A 10/09/2015   Procedure: EXAM UNDER ANESTHESIA; FISTULOTOMY;  Surgeon: Romie Levee, MD;  Location: [facility];  Service: General;  Laterality: N/A;   KNEE SURGERY  1993    There were no vitals filed for this visit.   Subjective Assessment - 02/07/21 0804     Subjective The dry needling. I feel better afterwards. I feel a tingling afterwards. I am able to     Patient Stated Goals to have a bowel movement without miralax    Currently in Pain? No/denies                            Pelvic Floor Special Questions - 02/07/21 0001     Pelvic Floor Internal Exam Patient confirms identification and approves assessment of pelvic floor and treatment    Exam Type Rectal               [facility] Adult PT Treatment/Exercise - 02/07/21 0001       Self-Care   Self-Care Other Self-Care Comments    Other Self-Care Comments  discussed with patient on how to manually stretch the anus daily to expand the anal canal.      Lumbar Exercises: Aerobic   Elliptical level 3 for 5 minutes to assess patient and warm up for dry needling      Manual Therapy   Manual Therapy Internal Pelvic Floor    Manual therapy comments to assess for dry needling    Internal Pelvic Floor manual work to the coccygeus and puborectalis with swooping motion; manual work to the coccygeus with one finer internal and external then mobilize the coccyx too with distraction; swooping motion of the levator ani while monitorin for relaxation of the puborectlis  Trigger Point Dry Needling - 02/07/21 0001     Consent Given? Yes    Education Handout Provided Previously provided    Other Dry Needling puborectalis, pubococcygeus, coccygeus   trigger point response, elongation of muscles                 PT Education - 02/07/21 [phone]     Education Details educated patient on using his finger to expand the anal canal in sidely and using uber lube    Person(s) Educated Patient    Methods Explanation;Demonstration    Comprehension Verbalized understanding              PT Short Term Goals - 10/23/20 1116       PT SHORT TERM GOAL #1   Title independent with intial HEP for stretches    Time 4    Period Weeks    Status Achieved      PT SHORT TERM GOAL #2   Title able to bulge the pelvic floor with correct breathing    Time 4    Period Weeks     Status Achieved               PT Long Term Goals - 01/24/21 0950       PT LONG TERM GOAL #1   Title independent with advanced HEP for core strength    Baseline still learning    Time 12    Period Weeks    Status On-going      PT LONG TERM GOAL #2   Title ability to have a bowel movement without straining and taking 50% less mirilax due to improved pelvic floor coordination    Time 12    Period Weeks    Status On-going      PT LONG TERM GOAL #3   Title bowel movements are Type 4 or 3 with width of thumb size or more due to the ability to expand the anal canal to have a bowel movement    Baseline still ribbon form    Time 12    Period Weeks    Status On-going      PT LONG TERM GOAL #4   Title when he has the urge to have a bowel movement he is able to have one due to improve coordination of the pelvic floor    Time 12    Period Weeks    Status On-going                   Plan - 02/07/21 0845     Clinical Impression Statement Patient is noticing an increased elongation of the pelvic floor muscles with the dry needling. He has not ordered the anal dilators but seeing if dry needling and him perfroming the manual work at home to the rectum. Patient puborectalis fully relaxed today for the first time in therapy. Patient is able to relax his muscles at home with the exception of the puborectalis. Patient will benefit from skilled therapy to imrpove the diameter of the anal canal to improve the quality of the stool and relaxation of the pelvic floor muscles.    Personal Factors and Comorbidities Comorbidity 2    Comorbidities fistula around the anus; anal fissures; abominal surgery for diverticultis    Examination-Activity Limitations Toileting    Stability/Clinical Decision Making Stable/Uncomplicated    Rehab Potential Excellent    PT Frequency 1x / week    PT Duration 12 weeks    PT  Treatment/Interventions ADLs/Self Care Home  Management;Biofeedback;Neuromuscular re-education;Therapeutic exercise;Therapeutic activities;Patient/family education;Manual techniques;Dry needling;Scar mobilization;Taping    PT Next Visit Plan dry needling to the pelvic floor, internal work to the anus,  ask about the urge to have a bowel movement; ask about the anal dilators    PT Home Exercise Plan Access Code: [email or web address]    Recommended Other Services MD signed all notes    Consulted and Agree with Plan of Care Patient             Patient will benefit from skilled therapeutic intervention in order to improve the following deficits and impairments:  Decreased coordination, Increased fascial restricitons, Decreased strength, Decreased scar mobility, Decreased activity tolerance  Visit Diagnosis: Muscle weakness (generalized)  Other lack of coordination  Dyssynergic defecation  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     Problem List Patient Active Problem List   Diagnosis Date Noted   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    Dyssynergic defecation    Diverticular disease 10/25/2019   Wears glasses    IBS (irritable bowel syndrome)    Rectal bleeding    Diverticulitis    Asthma     Eulis Foster, PT 02/07/21 8:49 AM  [facility] [address] [place], [place], [address] Phone: [phone]   Fax:  [phone]  Name: LABARRON DURNIN MRN: [id number] Date of Birth: 1970/07/30

## 2021-02-20 ENCOUNTER — Ambulatory Visit: Payer: Commercial Managed Care - PPO | Admitting: Physical Therapy

## 2021-03-19 ENCOUNTER — Other Ambulatory Visit: Payer: Self-pay

## 2021-03-19 ENCOUNTER — Encounter: Payer: Self-pay | Admitting: Physical Therapy

## 2021-03-19 ENCOUNTER — Ambulatory Visit: Payer: Commercial Managed Care - PPO | Attending: Gastroenterology | Admitting: Physical Therapy

## 2021-03-19 DIAGNOSIS — K5902 Outlet dysfunction constipation: Secondary | ICD-10-CM | POA: Insufficient documentation

## 2021-03-19 DIAGNOSIS — M6281 Muscle weakness (generalized): Secondary | ICD-10-CM | POA: Diagnosis not present

## 2021-03-19 DIAGNOSIS — R278 Other lack of coordination: Secondary | ICD-10-CM | POA: Diagnosis present

## 2021-03-19 NOTE — Therapy (Signed)
[facility] [address] [place], [place], [address] Phone: [phone]   Fax:  [phone]  Physical Therapy Treatment  Patient Details  Name: Jacob Graham MRN: [id number] Date of Birth: 05-16-1971 Referring Provider (PT): Dr. Napoleon Form   Encounter Date: 03/19/2021   PT End of Session - 03/19/21 1700     Visit Number 11    Date for PT Re-Evaluation 04/27/21    Authorization Type UHC    Authorization - Visit Number 11    Authorization - Number of Visits 60    PT Start Time 1615    PT Stop Time 1655    PT Time Calculation (min) 40 min    Activity Tolerance Patient tolerated treatment well;No increased pain    Behavior During Therapy WFL for tasks assessed/performed             Past Medical History:  Diagnosis Date   Asthma    Diverticulitis    IBS (irritable bowel syndrome)    Rectal bleeding    Wears glasses     Past Surgical History:  Procedure Laterality Date   ANAL RECTAL MANOMETRY N/A 07/24/2020   Procedure: ANO RECTAL MANOMETRY;  Surgeon: Napoleon Form, MD;  [facility]: [facility];  Service: Endoscopy;  Laterality: N/A;   APPENDECTOMY  1999   colonoscopy  2008, 2014   CYSTOSCOPY WITH RETROGRADE PYELOGRAM, URETEROSCOPY AND STENT PLACEMENT Bilateral 10/25/2019   Procedure: CYSTOSCOPY WITH BILATERAL RETROGRADE, FIREFLY INJECTION  AND URETERAL CATHETER PLACEMENT;  Surgeon: Sebastian Ache, MD;  Location: [facility];  Service: Urology;  Laterality: Bilateral;   EVALUATION UNDER ANESTHESIA WITH FISTULECTOMY N/A 10/09/2015   Procedure: EXAM UNDER ANESTHESIA; FISTULOTOMY;  Surgeon: Romie Levee, MD;  Location: [facility];  Service: General;  Laterality: N/A;   KNEE SURGERY  1993    There were no vitals filed for this visit.   Subjective Assessment - 03/19/21 1609     Subjective I have been going every 2-3 days. I am still doing the Miralax but not as much. The stool is  ribbon. This week is a little more difficulty for bowel movements due to stress. I have not sone my own finger for the trigger points.    Patient Stated Goals to have a bowel movement without miralax    Currently in Pain? No/denies                               [facility] Adult PT Treatment/Exercise - 03/19/21 0001       Self-Care   Self-Care Other Self-Care Comments    Other Self-Care Comments  using a ball to massage the pelvic floor and work into the trigger point, using different balls and sufaces discussed with taking prune juice at night.      Manual Therapy   Manual Therapy Soft tissue mobilization;Taping;Joint mobilization    Manual therapy comments to assess for dry needling    Joint Mobilization mobilize the coccyx by pulling it backward gently    Soft tissue mobilization to the left puborectalis, coccygeus, levator ani in right sidely    McConnell to hold the coccy backward to reduce tension on the anococcygeal ligament              Trigger Point Dry Needling - 03/19/21 0001     Consent Given? Yes    Education Handout Provided Previously provided    Other Dry Needling  left puborectalis, pubococcygeus, coccygeus   trigger point response, elongation of muscles                  PT Education - 03/19/21 1659     Education Details educated patient on how to use a ball or foam roller to massage the left pelvic floor to reduce trigger points; educated patient on how to use the pinky finger and graduate to the thumb to expand the anal canal    Person(s) Educated Patient    Methods Explanation;Demonstration    Comprehension Verbalized understanding;Returned demonstration              PT Short Term Goals - 10/23/20 1116       PT SHORT TERM GOAL #1   Title independent with intial HEP for stretches    Time 4    Period Weeks    Status Achieved      PT SHORT TERM GOAL #2   Title able to bulge the pelvic floor with correct breathing    Time  4    Period Weeks    Status Achieved               PT Long Term Goals - 03/19/21 1705       PT LONG TERM GOAL #1   Title independent with advanced HEP for core strength    Baseline still learning    Time 12    Period Weeks    Status On-going      PT LONG TERM GOAL #2   Title ability to have a bowel movement without straining and taking 50% less mirilax due to improved pelvic floor coordination    Time 12    Period Weeks    Status On-going      PT LONG TERM GOAL #3   Title bowel movements are Type 4 or 3 with width of thumb size or more due to the ability to expand the anal canal to have a bowel movement    Baseline still ribbon form    Time 12    Period Weeks    Status On-going      PT LONG TERM GOAL #4   Title when he has the urge to have a bowel movement he is able to have one due to improve coordination of the pelvic floor    Time 12    Period Weeks    Status Achieved                   Plan - 03/19/21 1700     Clinical Impression Statement Patient is able to have a bowel movement every 2-3 days. His stool started to be ribbon form again due to him being under stress in work. Patient coccyx is forward. Patient has trouble relaxing the external sphincter while having a bowel movement. Patient did have trigger points in the pelvic floor muscles and felt them relax. Patient has McConnell tape on the coccyx to hold it in an extended position. Paient has learned how to use balls to relax the trigger points in the pelvic floor. Patient has a stronger feeling to have a bowle movement due to improved rectal filling sensation. Patient will benefit from skilled therapy to imporve the diameter of the anal canal to improve the quality of the stool and relaxation of the pelvic floor muscles.    Personal Factors and Comorbidities Comorbidity 2    Comorbidities fistula around the anus; anal fissures; abominal surgery for diverticultis  Examination-Activity Limitations  Toileting    Stability/Clinical Decision Making Stable/Uncomplicated    Rehab Potential Excellent    PT Frequency 1x / week    PT Duration 12 weeks    PT Treatment/Interventions ADLs/Self Care Home Management;Biofeedback;Neuromuscular re-education;Therapeutic exercise;Therapeutic activities;Patient/family education;Manual techniques;Dry needling;Scar mobilization;Taping    PT Next Visit Plan dry needling to the pelvic floor, internal work to the anus,    PT Home Exercise Plan Access Code: [email or web address]    Consulted and Agree with Plan of Care Patient             Patient will benefit from skilled therapeutic intervention in order to improve the following deficits and impairments:  Decreased coordination, Increased fascial restricitons, Decreased strength, Decreased scar mobility, Decreased activity tolerance  Visit Diagnosis: Muscle weakness (generalized)  Other lack of coordination  Dyssynergic defecation  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     Problem List Patient Active Problem List   Diagnosis Date Noted   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    Dyssynergic defecation    Diverticular disease 10/25/2019   Wears glasses    IBS (irritable bowel syndrome)    Rectal bleeding    Diverticulitis    Asthma     Eulis Foster, PT 03/19/21 5:07 PM  [facility] [address], [address] [place], [place], [address] Phone: [phone]   Fax:  [phone]  Name: Jacob Graham MRN: [id number] Date of Birth: 11/07/1970

## 2021-03-31 ENCOUNTER — Ambulatory Visit: Payer: Commercial Managed Care - PPO | Admitting: Physical Therapy

## 2021-03-31 ENCOUNTER — Other Ambulatory Visit: Payer: Self-pay

## 2021-03-31 ENCOUNTER — Encounter: Payer: Self-pay | Admitting: Physical Therapy

## 2021-03-31 DIAGNOSIS — M6281 Muscle weakness (generalized): Secondary | ICD-10-CM | POA: Diagnosis not present

## 2021-03-31 DIAGNOSIS — K5902 Outlet dysfunction constipation: Secondary | ICD-10-CM

## 2021-03-31 DIAGNOSIS — R278 Other lack of coordination: Secondary | ICD-10-CM

## 2021-03-31 NOTE — Patient Instructions (Signed)
Access Code: [email or web address] URL: [email or web address] Date: 03/31/2021 Prepared by: Eulis Foster  Exercises Seated Piriformis Stretch with Trunk Bend - 1 x daily - 7 x weekly - 1 sets - 2 reps - 30 sec hold Seated Hamstring Stretch with Chair - 1 x daily - 7 x weekly - 1 sets - 2 reps - 30 sec hold Seated Hip Adductor Stretch - 1 x daily - 7 x weekly - 1 sets - 2 reps - 30 sec hold Seated Hip Flexor Stretch - 1 x daily - 7 x weekly - 1 sets - 2 reps - 30 sec hold Supine Pelvic Floor Stretch - 1 x daily - 7 x weekly - 1 sets - 1 reps - 1 min hold Cat Cow - 1 x daily - 7 x weekly - 1 sets - 10 reps Supine Transversus Abdominis Bracing with Pelvic Floor Contraction - 1 x daily - 7 x weekly - 1 sets - 10 reps - 5 sec hold Seated Transversus Abdominis Bracing - 2 x daily - 7 x weekly - 1 sets - 10 reps - 5 sec hold Sidelying IT Band Foam Roll Mobilization - 1 x daily - 7 x weekly - 3 sets - 10 reps Hip Flexor Mobilization with Foam Roll - 1 x daily - 7 x weekly - 3 sets - 10 reps Piriformis Mobilization on Foam Roll - 1 x daily - 7 x weekly - 3 sets - 10 reps Adductor Mobilization with Foam Roll - 1 x daily - 7 x weekly - 3 sets - 10 reps Deep Squat with Pelvic Floor Relaxation - 1 x daily - 7 x weekly - 1 sets - 1 reps - 30 sec hold Standing Thoracic Spine Stretch - 1 x daily - 7 x weekly - 3 sets - 10 reps Seated Diaphragmatic Breathing - 1 x daily - 7 x weekly - 1 sets - 10 reps [facility] [address] [place], [place] [address] Phone # [phone] Fax [phone]

## 2021-03-31 NOTE — Therapy (Addendum)
[facility] [address] [place], [place], [address] Phone: [phone]   Fax:  [phone]  Physical Therapy Treatment  Patient Details  Name: Jacob Graham MRN: [id number] Date of Birth: [birth year] Referring Provider (PT): Dr. Mauri Pole   Encounter Date: 03/31/2021   PT End of Session - 03/31/21 1508     Visit Number 12    Date for PT Re-Evaluation 04/27/21    Authorization Type UHC    Authorization - Visit Number 12    Authorization - Number of Visits 60    PT Start Time 2248    PT Stop Time 1225    PT Time Calculation (min) 40 min    Activity Tolerance Patient tolerated treatment well;No increased pain    Behavior During Therapy WFL for tasks assessed/performed             Past Medical History:  Diagnosis Date   Asthma    Diverticulitis    IBS (irritable bowel syndrome)    Rectal bleeding    Wears glasses     Past Surgical History:  Procedure Laterality Date   ANAL RECTAL MANOMETRY N/A 07/24/2020   Procedure: ANO RECTAL MANOMETRY;  Surgeon: Mauri Pole, MD;  [facility]: [facility];  Service: Endoscopy;  Laterality: N/A;   APPENDECTOMY  1999   colonoscopy  2008, 2014   [place], URETEROSCOPY AND STENT PLACEMENT Bilateral 10/25/2019   Procedure: CYSTOSCOPY WITH BILATERAL RETROGRADE, FIREFLY INJECTION  AND URETERAL CATHETER PLACEMENT;  Surgeon: Alexis Frock, MD;  Location: [facility];  Service: Urology;  Laterality: Bilateral;   EVALUATION UNDER ANESTHESIA WITH FISTULECTOMY N/A 10/09/2015   Procedure: EXAM UNDER ANESTHESIA; FISTULOTOMY;  Surgeon: Leighton Ruff, MD;  Location: [facility];  Service: General;  Laterality: N/A;   [place]    There were no vitals filed for this visit.   Subjective Assessment - 03/31/21 1148     Subjective Today is my last day. I am not up to the internal work.    Patient Stated Goals to have a  bowel movement without miralax    Currently in Pain? No/denies    Multiple Pain Sites No                [facility] PT Assessment - 03/31/21 0001       Assessment   Medical Diagnosis K59.02 constipation due to outlet dysfunction; K59.02 Dyssynerfic defecation    Referring Provider (PT) Dr. Mauri Pole    Onset Date/Surgical Date --   chronic   Prior Therapy none      Precautions   Precautions None      Restrictions   Weight Bearing Restrictions No      Home Environment   Living Environment Private residence      Prior Function   Level of Independence Independent    Vocation Full time employment    Vocation Requirements sitting    Leisure bike, treadmill on incline      Cognition   Overall Cognitive Status Within Functional Limits for tasks assessed      Observation/Other Assessments   Skin Integrity decreased mobility of abdominal scar and suprapubically      Strength   Overall Strength Comments bil. hip strength is 5/5                        Pelvic Floor Special Questions - 03/31/21 0001  Strength fair squeeze, definite lift               [facility] Adult PT Treatment/Exercise - 03/31/21 0001       Self-Care   Self-Care Other Self-Care Comments    Other Self-Care Comments  discussed with patient on how he can use his fingers for expanding the anal sphincter with massage techniques, using smallest diameter finger to the larges to expand the canal; education on the pelvic floor and what is restricted and how a ribbon like stool happens      Neuro Re-ed    Neuro Re-ed Details  education of diaphragmatic breathing in sitting to relax the pelvic floor      Lumbar Exercises: Stretches   Active Hamstring Stretch Right;Left;1 rep;30 seconds    Active Hamstring Stretch Limitations sitting with leg on something    Hip Flexor Stretch Left;Right;60 seconds    Hip Flexor Stretch Limitations prone on foam roll    ITB Stretch Right;Left;1 rep;60  seconds    ITB Stretch Limitations sidely on foam roll    Piriformis Stretch Right;Left;60 seconds    Piriformis Stretch Limitations sitting on foam roll    Other Lumbar Stretch Exercise standing mid thoracic stretch while holding onto counter intead of prayer stretch with foam roll    Other Lumbar Stretch Exercise squat stretch to relax pelvic floor prior to go to the commode; foam rolling ti lower exxtremity muscles      Lumbar Exercises: Supine   Ab Set 15 reps;5 seconds      Lumbar Exercises: Quadruped   [place] 10 reps   then did with arms on counter                    PT Education - 03/31/21 1229     Education Details Access Code: [email or web address]    Person(s) Educated Patient    Methods Explanation;Demonstration;Verbal cues    Comprehension Returned demonstration;Verbalized understanding              PT Short Term Goals - 10/23/20 1116       PT SHORT TERM GOAL #1   Title independent with intial HEP for stretches    Time 4    Period Weeks    Status Achieved      PT SHORT TERM GOAL #2   Title able to bulge the pelvic floor with correct breathing    Time 4    Period Weeks    Status Achieved               PT Long Term Goals - 03/31/21 1511       PT LONG TERM GOAL #1   Title independent with advanced HEP for core strength    Time 12    Period Weeks    Status Achieved      PT LONG TERM GOAL #2   Title ability to have a bowel movement without straining and taking 50% less mirilax due to improved pelvic floor coordination    Time 12    Period Weeks    Status Achieved      PT LONG TERM GOAL #3   Title bowel movements are Type 4 or 3 with width of thumb size or more due to the ability to expand the anal canal to have a bowel movement    Baseline still ribbon form    Time 12    Period Weeks    Status Partially Met  PT LONG TERM GOAL #4   Title when he has the urge to have a bowel movement he is able to have one due to improve  coordination of the pelvic floor    Time 12    Period Weeks    Status Achieved                   Plan - 03/31/21 1508     Clinical Impression Statement Patient is having a bowel movement every 2-3 days. His stool is still ribbon shaped but understands how to expand anus to increase the diameter. Patient understands how to massage the anus to improve the diameter of the stool. When patient has the urge to have a bowel movement, he is able to have one. Patient is independent with HEP and understands how to advance himself.    Comorbidities fistula around the anus; anal fissures; abominal surgery for diverticultis    Examination-Activity Limitations Toileting    Stability/Clinical Decision Making Stable/Uncomplicated    Rehab Potential Excellent    PT Treatment/Interventions ADLs/Self Care Home Management;Biofeedback;Neuromuscular re-education;Therapeutic exercise;Therapeutic activities;Patient/family education;Manual techniques;Dry needling;Scar mobilization;Taping    PT Next Visit Plan Discharge to HEP    PT Home Exercise Plan Access Code: [email or web address]    Consulted and Agree with Plan of Care Patient             Patient will benefit from skilled therapeutic intervention in order to improve the following deficits and impairments:  Decreased coordination, Increased fascial restricitons, Decreased strength, Decreased scar mobility, Decreased activity tolerance  Visit Diagnosis: Muscle weakness (generalized)  Other lack of coordination  Dyssynergic defecation  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     Problem List Patient Active Problem List   Diagnosis Date Noted   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    Dyssynergic defecation    Diverticular disease 10/25/2019   Wears glasses    IBS (irritable bowel syndrome)    Rectal bleeding    Diverticulitis    Asthma     Earlie Counts, PT 03/31/21 3:14 PM [facility] [address] [place], [place], [address] Phone: [phone]   Fax:  [phone]  Name: Jacob Graham MRN: [id number] Date of Birth: 03/24/1971  PHYSICAL THERAPY DISCHARGE SUMMARY  Visits from Start of Care: 12  Current functional level related to goals / functional outcomes: See above.    Remaining deficits: See above.    Education / Equipment: HEP   Patient agrees to discharge. Patient goals were met. Patient is being discharged due to meeting the stated rehab goals. Thank you for referral. Earlie Counts, PT 03/31/21 3:16 PM

## 2021-04-28 ENCOUNTER — Encounter: Payer: Commercial Managed Care - PPO | Admitting: Physical Therapy

## 2022-01-13 ENCOUNTER — Telehealth: Payer: Self-pay | Admitting: Gastroenterology

## 2022-01-14 NOTE — Telephone Encounter (Signed)
Ok to send referral to PT and please schedule follow up visit in office. We can send an order for standing desk, not sure if insurance will cover it. Thanks

## 2022-01-15 ENCOUNTER — Other Ambulatory Visit: Payer: Self-pay

## 2022-01-15 DIAGNOSIS — K5902 Outlet dysfunction constipation: Secondary | ICD-10-CM

## 2022-01-15 NOTE — Telephone Encounter (Signed)
Patient informed of the referral.  The letter for the standing desk is for his employer. The employer has his request and are asking for a medical statement of reason or benefit for him.

## 2022-01-16 ENCOUNTER — Other Ambulatory Visit: Payer: Self-pay

## 2022-01-16 NOTE — Telephone Encounter (Signed)
He has chronic constipation and pelvic floor dysfunction that he is followed by our office, if he need additional supporting diagnosis, please advise patient approach to PMD

## 2022-02-12 NOTE — Therapy (Signed)
OUTPATIENT PHYSICAL THERAPY MALE PELVIC EVALUATION   Patient Name: Jacob Graham MRN: [id number] DOB:Feb 11, 1971, 51 y.o., male Today's Date: 02/16/2022   PT End of Session - 02/16/22 0846     Visit Number 1    Date for PT Re-Evaluation 05/11/22    Authorization Type [employer]    PT Start Time 0800    PT Stop Time 0845    PT Time Calculation (min) 45 min    Activity Tolerance Patient tolerated treatment well    Behavior During Therapy WFL for tasks assessed/performed             Past Medical History:  Diagnosis Date   Asthma    Diverticulitis    IBS (irritable bowel syndrome)    Rectal bleeding    Wears glasses    Past Surgical History:  Procedure Laterality Date   ANAL RECTAL MANOMETRY N/A 07/24/2020   Procedure: ANO RECTAL MANOMETRY;  Surgeon: Napoleon Form, MD;  Location: [facility];  Service: Endoscopy;  Laterality: N/A;   APPENDECTOMY  1999   colonoscopy  2008, 2014   CYSTOSCOPY WITH RETROGRADE PYELOGRAM, URETEROSCOPY AND STENT PLACEMENT Bilateral 10/25/2019   Procedure: CYSTOSCOPY WITH BILATERAL RETROGRADE, FIREFLY INJECTION  AND URETERAL CATHETER PLACEMENT;  Surgeon: Sebastian Ache, MD;  Location: [facility];  Service: Urology;  Laterality: Bilateral;   EVALUATION UNDER ANESTHESIA WITH FISTULECTOMY N/A 10/09/2015   Procedure: EXAM UNDER ANESTHESIA; FISTULOTOMY;  Surgeon: Romie Levee, MD;  Location: [facility];  Service: General;  Laterality: N/A;   KNEE SURGERY  1993   Patient Active Problem List   Diagnosis Date Noted   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    Dyssynergic defecation    Diverticular disease 10/25/2019   Wears glasses    IBS (irritable bowel syndrome)    Rectal bleeding    Diverticulitis    Asthma     PCP: [facility]  REFERRING PROVIDER: Napoleon Form, MD  REFERRING DIAG:  [phone] (ICD-10-CM) - Constipation, outlet dysfunction  K59.02 (ICD-10-CM) - Dyssynergic defecation    THERAPY DIAG:  Muscle  weakness (generalized)  Other lack of coordination  Dyssynergic defecation  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  Rationale for Evaluation and Treatment Rehabilitation  ONSET DATE: 01/03/2022  SUBJECTIVE:                                                                                                                                                                                           SUBJECTIVE STATEMENT: Patient reports his issues with constipation has ramped up. I had a lot of caffeine and increased my symptoms.  Fluid intake: water  PAIN:  Are you having pain? No   PRECAUTIONS: None  WEIGHT BEARING RESTRICTIONS No  FALLS:  Has patient fallen in last 6 months? No  LIVING ENVIRONMENT: Lives with: lives with their spouse   OCCUPATION: full time; walking daily, weights 3 times per week, stretch daily  PLOF: Independent  PATIENT GOALS reduce constipation and understand to use the tools  PERTINENT HISTORY:  IBS; Appendectomy; Fistulectomy   BOWEL MOVEMENT Pain with bowel movement: No Type of bowel movement:Type (Bristol Stool Scale) Type 4 that is long ribbon stool, Frequency daily but small amount, and Strain Yes Fully empty rectum: No Leakage: No Pads: No Fiber supplement: Yes: metamucil but was making it trouble to have a bowel movement  URINATION Pain with urination: No Fully empty bladder: Yes:   Stream: Strong Urgency: No Frequency: getting to urinate has difficulty   OBJECTIVE:   DIAGNOSTIC FINDINGS:  none   COGNITION:  Overall cognitive status: Within functional limits for tasks assessed     SENSATION:  Light touch: Appears intact  Proprioception: Appears intact      POSTURE: No Significant postural limitations   PELVIC ALIGNMENT:  LUMBARAROM/PROM full lumbar ROM   LOWER EXTREMITY AROM/PROM:   A/PROM Right eval Left eval  Hip flexion Pain at endrange Pain at endrange  Hip internal rotation 10 10  Hip external rotation   45   (Blank rows = not tested)  LOWER EXTREMITY MMT:  MMT Right eval Left eval  Hip extension 4/5 4/5  Hip abduction 4/5 4/5   PALPATION: GENERAL not able to push the therapist finger out of the anal canal              External Perineal Exam tenderness located  in the ischiocavernosus              Internal Pelvic Floor tenderness located on the left obturator internist, internal sphincter that is a tight band, iliococcygeus, anococcygeal ligament Patient confirms identification and approves PT to assess internal pelvic floor and treatment Yes  PELVIC MMT:   MMT eval  Internal Anal Sphincter 4/5  External Anal Sphincter 4/5  Puborectalis 4/5  (Blank rows = not tested)   TONE: increased  TODAY'S TREATMENT  EVAL Date:  HEP established-see below    PATIENT EDUCATION:  Education details: educated patient on how to use the rectal wand internally to work on the trigger points of the pelvic floor. He is to do it every other day.  Person educated: Patient Education method: Explanation, Demonstration, Tactile cues, and Verbal cues Education comprehension: verbalized understanding, returned demonstration, verbal cues required, tactile cues required, and needs further education   HOME EXERCISE PROGRAM: See above.   ASSESSMENT:  CLINICAL IMPRESSION: Patient is a 51 y.o. male  who was seen today for physical therapy evaluation and treatment for constipation and Dyssynergic defecation. Patient reports in the past month his constipation issues have increased. He will have a bowel movement daily but it is a thin ribbon and he has to strain. He does have difficulty with initiating a urine stream. Pelvic floor strength is 4/5 but he is not able to push the therapist finger out of the anal canal. He has a band that is the internal sphincter and is very tight.  Patient has tenderness located on the left obturator internist, internal sphincter that is a tight band, iliococcygeus,  anococcygeal ligament. Hip extension and abduction is 4/5. He has tightness in bilateral hip rotators. Patient was educated on how to use his  pelvic wand on the pelvic floor to reduce trigger points and tightness. He has also purchased anal dilators. Patient will benefit from skilled therapy to improve constipation, elongation of the pelvic floor and improve the ability to push stool out.    OBJECTIVE IMPAIRMENTS decreased activity tolerance, decreased coordination, decreased endurance, decreased ROM, decreased strength, increased fascial restrictions, increased muscle spasms, and impaired tone.   ACTIVITY LIMITATIONS toileting  PARTICIPATION LIMITATIONS:  none  PERSONAL FACTORS Time since onset of injury/illness/exacerbation are also affecting patient's functional outcome.   REHAB POTENTIAL: Excellent  CLINICAL DECISION MAKING: Stable/uncomplicated  EVALUATION COMPLEXITY: Low   GOALS: Goals reviewed with patient? Yes  SHORT TERM GOALS: Target date: 03/16/2022   Patient understands how to use the anal wand to reduce trigger points in the pelvic floor.  Baseline: Goal status: INITIAL  2.  Patient is independent with use of the anal dilators to open up the anus especially the band on the internal anal sphincter.  Baseline:  Goal status: INITIAL  3.  Patient is independent with hip stretches to elongate the pelvic floor.  Baseline:  Goal status: INITIAL   LONG TERM GOALS: Target date: 05/11/2022   Patient is independent with HEP for flexibility and coordination of the pelvic floor.  Baseline:  Goal status: INITIAL  2.  Patient is able to push the therapist finger out of the anal canal with correct breath and relaxation of the pelvic floor.  Baseline:  Goal status: INITIAL  3.  Patient stool diameter is the size of nickel or bigger due to elongation of the anal canal.  Baseline:  Goal status: INITIAL  4.  Patient reports his straining with bowel movement has decreased >/=  75% due to elongation of the pelvic floor.  Baseline:  Goal status: INITIAL    PLAN: PT FREQUENCY: 1x/week  PT DURATION: 12 weeks  PLANNED INTERVENTIONS: Therapeutic exercises, Therapeutic activity, Neuromuscular re-education, Patient/Family education, Self Care, Joint mobilization, Dry Needling, Cryotherapy, Moist heat, Taping, Biofeedback, and Manual therapy  PLAN FOR NEXT SESSION: work internally, stretches especially for hip adductors and rotators, pelvic floor drop   Eulis Foster, PT 02/16/22 11:27 AM

## 2022-02-16 ENCOUNTER — Other Ambulatory Visit: Payer: Self-pay

## 2022-02-16 ENCOUNTER — Ambulatory Visit: Payer: Commercial Managed Care - PPO | Attending: Gastroenterology | Admitting: Physical Therapy

## 2022-02-16 ENCOUNTER — Encounter: Payer: Self-pay | Admitting: Physical Therapy

## 2022-02-16 DIAGNOSIS — R278 Other lack of coordination: Secondary | ICD-10-CM | POA: Diagnosis present

## 2022-02-16 DIAGNOSIS — K5902 Outlet dysfunction constipation: Secondary | ICD-10-CM | POA: Insufficient documentation

## 2022-02-16 DIAGNOSIS — M6281 Muscle weakness (generalized): Secondary | ICD-10-CM | POA: Diagnosis present

## 2022-03-11 ENCOUNTER — Ambulatory Visit: Payer: Commercial Managed Care - PPO | Attending: Gastroenterology | Admitting: Physical Therapy

## 2022-03-11 ENCOUNTER — Encounter: Payer: Self-pay | Admitting: Physical Therapy

## 2022-03-11 DIAGNOSIS — M6281 Muscle weakness (generalized): Secondary | ICD-10-CM | POA: Insufficient documentation

## 2022-03-11 DIAGNOSIS — K5902 Outlet dysfunction constipation: Secondary | ICD-10-CM | POA: Diagnosis present

## 2022-03-11 DIAGNOSIS — R278 Other lack of coordination: Secondary | ICD-10-CM | POA: Diagnosis present

## 2022-03-11 NOTE — Therapy (Signed)
OUTPATIENT PHYSICAL THERAPY TREATMENT NOTE   Patient Name: Jacob Graham MRN: [id number] DOB:Nov 20, 1970, 51 y.o., male Today's Date: 03/11/2022  PCP: [facility] REFERRING PROVIDER: Napoleon Form, MD  END OF SESSION:   PT End of Session - 03/11/22 1356     Visit Number 2    Date for PT Re-Evaluation 05/11/22    Authorization Type [employer]    PT Start Time 1230    PT Stop Time 1310    PT Time Calculation (min) 40 min    Activity Tolerance Patient tolerated treatment well    Behavior During Therapy WFL for tasks assessed/performed             Past Medical History:  Diagnosis Date   Asthma    Diverticulitis    IBS (irritable bowel syndrome)    Rectal bleeding    Wears glasses    Past Surgical History:  Procedure Laterality Date   ANAL RECTAL MANOMETRY N/A 07/24/2020   Procedure: ANO RECTAL MANOMETRY;  Surgeon: Napoleon Form, MD;  Location: [facility];  Service: Endoscopy;  Laterality: N/A;   APPENDECTOMY  1999   colonoscopy  2008, 2014   CYSTOSCOPY WITH RETROGRADE PYELOGRAM, URETEROSCOPY AND STENT PLACEMENT Bilateral 10/25/2019   Procedure: CYSTOSCOPY WITH BILATERAL RETROGRADE, FIREFLY INJECTION  AND URETERAL CATHETER PLACEMENT;  Surgeon: Sebastian Ache, MD;  Location: [facility];  Service: Urology;  Laterality: Bilateral;   EVALUATION UNDER ANESTHESIA WITH FISTULECTOMY N/A 10/09/2015   Procedure: EXAM UNDER ANESTHESIA; FISTULOTOMY;  Surgeon: Romie Levee, MD;  Location: [facility];  Service: General;  Laterality: N/A;   KNEE SURGERY  1993   Patient Active Problem List   Diagnosis Date Noted   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    Dyssynergic defecation    Diverticular disease 10/25/2019   Wears glasses    IBS (irritable bowel syndrome)    Rectal bleeding    Diverticulitis    Asthma    REFERRING DIAG:  K59.02 (ICD-10-CM) - Constipation, outlet dysfunction  K59.02 (ICD-10-CM) - Dyssynergic defecation      THERAPY DIAG:   Muscle weakness (generalized)   Other lack of coordination   Dyssynergic defecation   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   Rationale for Evaluation and Treatment Rehabilitation   ONSET DATE: 01/03/2022   SUBJECTIVE:                                                                                                                                                                                            SUBJECTIVE STATEMENT: I am feeling like I am more tense. I am more tense. I have not  recognized the precursor to the constipation. I have not done the wand. I have the mental block for the wand. My go to is the miralax. I have been doing my stretches and being active.     PAIN:  Are you having pain? No     PRECAUTIONS: None   WEIGHT BEARING RESTRICTIONS No   FALLS:  Has patient fallen in last 6 months? No   LIVING ENVIRONMENT: Lives with: lives with their spouse     OCCUPATION: full time; walking daily, weights 3 times per week, stretch daily   PLOF: Independent   PATIENT GOALS reduce constipation and understand to use the tools   PERTINENT HISTORY:  IBS; Appendectomy; Fistulectomy     BOWEL MOVEMENT Pain with bowel movement: No Type of bowel movement:Type (Bristol Stool Scale) Type 4 that is long ribbon stool, Frequency daily but small amount, and Strain Yes Fully empty rectum: No Leakage: No Pads: No Fiber supplement: Yes: metamucil but was making it trouble to have a bowel movement   URINATION Pain with urination: No Fully empty bladder: Yes:   Stream: Strong Urgency: No Frequency: getting to urinate has difficulty     OBJECTIVE:    DIAGNOSTIC FINDINGS:  none     COGNITION:            Overall cognitive status: Within functional limits for tasks assessed                          SENSATION:            Light touch: Appears intact            Proprioception: Appears intact                     POSTURE: No Significant postural limitations                PELVIC ALIGNMENT:   LUMBARAROM/PROM full lumbar ROM     LOWER EXTREMITY AROM/PROM:    A/PROM Right eval Left eval  Hip flexion Pain at endrange Pain at endrange  Hip internal rotation 10 10  Hip external rotation   45   (Blank rows = not tested)   LOWER EXTREMITY MMT:   MMT Right eval Left eval  Hip extension 4/5 4/5  Hip abduction 4/5 4/5    PALPATION: GENERAL not able to push the therapist finger out of the anal canal               External Perineal Exam tenderness located  in the ischiocavernosus               Internal Pelvic Floor tenderness located on the left obturator internist, internal sphincter that is a tight band, iliococcygeus, anococcygeal ligament Patient confirms identification and approves PT to assess internal pelvic floor and treatment Yes   PELVIC MMT:   MMT eval  Internal Anal Sphincter 4/5  External Anal Sphincter 4/5  Puborectalis 4/5  (Blank rows = not tested)     TONE: increased   TODAY'S TREATMENT  03/11/2022  Neuromuscular re-education: Down training:diaphragmatic breathing in supine 10x Diaphragmatic breathing in sitting Exercises: Stretches/mobility: foam roll the ITB Hip adductor foam roll  Piriformis foam roll Therapeutic activities: Functional strengthening activities:sitting with squatty potty with breath and tone to relax the pelvic floor to have a bowel movements Self-care: Education of how the diaphragm and pelvic floor works with breath Education on the pelvic floor and how the  rectal dilator works, how to insert the dilator, use the lubricant    EVAL Date:  HEP established-see below      PATIENT EDUCATION:  03/11/2022 Education details: educated patient on how to use the rectal wand internally to work on the trigger points of the pelvic floor. He is to do it every other day. Access Code: [email or web address] Person educated: Patient Education method: Explanation, Demonstration, Tactile cues, and Verbal cues Education  comprehension: verbalized understanding, returned demonstration, verbal cues required, tactile cues required, and needs further education     HOME EXERCISE PROGRAM: 03/11/2022 Access Code: [email or web address] URL: [email or web address] Date: 03/11/2022 Prepared by: Eulis Foster  Exercises -- Sidelying IT Band Foam Roll Mobilization  - 1 x daily - 7 x weekly - 3 sets - 10 reps - Hip Flexor Mobilization with Foam Roll  - 1 x daily - 7 x weekly - 3 sets - 10 reps - Piriformis Mobilization on Foam Roll  - 1 x daily - 7 x weekly - 3 sets - 10 reps - Adductor Mobilization with Foam Roll  - 1 x daily - 7 x weekly - 3 sets - 10 reps -  ASSESSMENT:   CLINICAL IMPRESSION: Patient is a 51 y.o. male  who was seen today for physical therapy treatment for constipation and Dyssynergic defecation. Patient has not been comfortable using the wand or anal dilators yet. Today we went over how to use the dilators and how it effects the tissue and expand the anus so larger stool can pass through. Therapist showed him the anatomy to understand the concept of using the dilators. He will start with the smallest dilator. Patient will benefit from skilled therapy to improve constipation, elongation of the pelvic floor and improve the ability to push stool out.      OBJECTIVE IMPAIRMENTS decreased activity tolerance, decreased coordination, decreased endurance, decreased ROM, decreased strength, increased fascial restrictions, increased muscle spasms, and impaired tone.    ACTIVITY LIMITATIONS toileting   PARTICIPATION LIMITATIONS:  none   PERSONAL FACTORS Time since onset of injury/illness/exacerbation are also affecting patient's functional outcome.    REHAB POTENTIAL: Excellent   CLINICAL DECISION MAKING: Stable/uncomplicated   EVALUATION COMPLEXITY: Low     GOALS: Goals reviewed with patient? Yes   SHORT TERM GOALS: Target date: 03/16/2022    Patient understands how to use the anal wand to reduce  trigger points in the pelvic floor.  Baseline: Goal status: INITIAL   2.  Patient is independent with use of the anal dilators to open up the anus especially the band on the internal anal sphincter.  Baseline:  Goal status: INITIAL   3.  Patient is independent with hip stretches to elongate the pelvic floor.  Baseline:  Goal status: INITIAL     LONG TERM GOALS: Target date: 05/11/2022    Patient is independent with HEP for flexibility and coordination of the pelvic floor.  Baseline:  Goal status: INITIAL   2.  Patient is able to push the therapist finger out of the anal canal with correct breath and relaxation of the pelvic floor.  Baseline:  Goal status: INITIAL   3.  Patient stool diameter is the size of nickel or bigger due to elongation of the anal canal.  Baseline:  Goal status: INITIAL   4.  Patient reports his straining with bowel movement has decreased >/= 75% due to elongation of the pelvic floor.  Baseline:  Goal status: INITIAL       PLAN: PT FREQUENCY: 1x/week  PT DURATION: 12 weeks   PLANNED INTERVENTIONS: Therapeutic exercises, Therapeutic activity, Neuromuscular re-education, Patient/Family education, Self Care, Joint mobilization, Dry Needling, Cryotherapy, Moist heat, Taping, Biofeedback, and Manual therapy   PLAN FOR NEXT SESSION: work internally, see how it is going with the dilator, dry needling to pelvic floor,  pelvic floor drop    Eulis Foster, PT 03/11/22 1:59 PM

## 2022-03-11 NOTE — Patient Instructions (Signed)
PROTOCOL FOR VAGINAL DILATORS   Wash dilator with soap and water prior to insertion.    Lay on your side with one  Knees on pillow and bottom leg straight  Lubricate the end of the dilator with a water-soluble lubricant.  Separate the buttock checks Tense the pelvic floor muscles than relax; while relaxing, slide lubricated dilator( round side of dilator) into the vagina.  Insert toward the direction of your spine. Dilator should feel snug and no pain more than 3/10.   Tense muscles again while holding the dilator so it does not get pushed out; relax and slide it in a little further.   Try blowing out as if filling a balloon; this may relax the muscles and allow penetration.  Repeat blowing out to insert dilator further.  Keep dilator in for 10 minutes if tolerate, with the pelvic floor muscles relaxed to further stretch the canal.   Never force the dilator into the canal. Once the dilator is comfortable start to move in and out, side to side, move your hips in different directions  3-4 times per week Progression of dilator.  When you are able to place dilator into rectal canal and feel no pain or able to move without difficulty you are ready for the next size.  Before you go to the next size start with the original size for 2 minutes then use the next size up for 5 minutes. When the next size up is easy to use, do not have to start with the smaller size.

## 2022-03-18 ENCOUNTER — Ambulatory Visit: Payer: Commercial Managed Care - PPO | Admitting: Gastroenterology

## 2022-03-18 ENCOUNTER — Encounter: Payer: Self-pay | Admitting: Gastroenterology

## 2022-03-18 VITALS — BP 126/84 | HR 66 | Ht 66.0 in | Wt 218.2 lb

## 2022-03-18 DIAGNOSIS — K5904 Chronic idiopathic constipation: Secondary | ICD-10-CM | POA: Diagnosis not present

## 2022-03-18 DIAGNOSIS — K5902 Outlet dysfunction constipation: Secondary | ICD-10-CM | POA: Diagnosis not present

## 2022-03-18 MED ORDER — LINACLOTIDE 145 MCG PO CAPS: 145.0000 ug | ORAL_CAPSULE | Freq: Every day | ORAL | 3 refills | Status: DC

## 2022-03-18 MED ORDER — HYDROCORTISONE ACETATE 25 MG RE SUPP: 25.0000 mg | Freq: Two times a day (BID) | RECTAL | 3 refills | Status: AC | PRN

## 2022-03-18 NOTE — Progress Notes (Signed)
Jacob Graham    [id number]    01/31/1971  Primary Care Physician:Center, [facility]  Referring Physician: Center, [facility] [address] [place],  [place] [address]   Chief complaint: Constipation   HPI:  79 yr very pleasant gentleman here for follow up visit for constipation, abdominal bloating and discomfort.  He improved with pelvic floor physical therapy and biofeedback last year, started having recurrent symptoms and went back to pelvic floor physical therapy for reassessment. He is currently taking Benefiber and MiraLAX with sensation of incomplete evacuation.  On average she has a small bowel movement every 2 to 3 days.  He continues to have occasional symptoms from hemorrhoids   All his symptoms specially constipation and incomplete defecation is worse after surgery in 2021Colonoscopy 08/04/2019 showed diverticulosis and internal hemorrhoids, no evidence of colitis or IBD.   History of diverticulitis with a microperforation s/p robotic assisted sigmoidectomy by Dr. Romie Levee on 10/25/2019   Outpatient Encounter Medications as of 03/18/2022  Medication Sig   ascorbic acid (C 500/ROSE HIPS) 500 MG tablet Take 500 mg by mouth daily. (Patient not taking: Reported on 02/16/2022)   hydrocortisone (ANUSOL-HC) 2.5 % rectal cream Place 1 application rectally 2 (two) times daily.   ibuprofen (ADVIL) 200 MG tablet Take 800 mg by mouth every 6 (six) hours as needed for fever, headache or mild pain.   MAGNESIUM PO Take 1 tablet by mouth daily. (Patient not taking: Reported on 02/16/2022)   Multiple Vitamin (MULTIVITAMIN WITH MINERALS) TABS tablet Take 1 tablet by mouth daily.   polyethylene glycol (MIRALAX / GLYCOLAX) 17 g packet Take 17 g by mouth 2 (two) times daily as needed for mild constipation.   shark liver oil-cocoa butter (PREPARATION H) 0.25-3-85.5 % suppository Place 1 suppository rectally as needed for hemorrhoids.   [DISCONTINUED] ondansetron  (ZOFRAN ODT) 4 MG disintegrating tablet Take 1 tablet (4 mg total) by mouth every 8 (eight) hours as needed for nausea or vomiting. (Patient not taking: Reported on 02/16/2022)   [DISCONTINUED] witch hazel-glycerin (TUCKS) pad Apply 1 application topically as needed for itching. (Patient not taking: Reported on 02/16/2022)   No facility-administered encounter medications on file as of 03/18/2022.    Allergies as of 03/18/2022 - Review Complete 03/11/2022  Allergen Reaction Noted   Banana Anaphylaxis 10/08/2015   Iodinated contrast media Other (See Comments) 06/15/2019    Past Medical History:  Diagnosis Date   Asthma    Diverticulitis    IBS (irritable bowel syndrome)    Rectal bleeding    Wears glasses     Past Surgical History:  Procedure Laterality Date   ANAL RECTAL MANOMETRY N/A 07/24/2020   Procedure: ANO RECTAL MANOMETRY;  Surgeon: Napoleon Form, MD;  Location: [facility];  Service: Endoscopy;  Laterality: N/A;   APPENDECTOMY  1999   colonoscopy  2008, 2014   CYSTOSCOPY WITH RETROGRADE PYELOGRAM, URETEROSCOPY AND STENT PLACEMENT Bilateral 10/25/2019   Procedure: CYSTOSCOPY WITH BILATERAL RETROGRADE, FIREFLY INJECTION  AND URETERAL CATHETER PLACEMENT;  Surgeon: Sebastian Ache, MD;  Location: [facility];  Service: Urology;  Laterality: Bilateral;   EVALUATION UNDER ANESTHESIA WITH FISTULECTOMY N/A 10/09/2015   Procedure: EXAM UNDER ANESTHESIA; FISTULOTOMY;  Surgeon: Romie Levee, MD;  Location: [facility];  Service: General;  Laterality: N/A;   KNEE SURGERY  1993    Family History  Problem Relation Age of Onset   Seizures Mother  partial complex    Cancer Paternal Grandfather        Colon   Colon cancer Paternal Grandfather 2    Social History   Socioeconomic History   Marital status: Married    Spouse name: Not on file   Number of children: Not on file   Years of education: Not on file   Highest education level: Not on file  Occupational  History   Not on file  Tobacco Use   Smoking status: Former    Types: Cigarettes    Quit date: 03/03/2004    Years since quitting: 18.0   Smokeless tobacco: Former  Building services engineer Use: Never used  Substance and Sexual Activity   Alcohol use: Yes    Alcohol/week: 2.0 standard drinks of alcohol    Types: 2 Glasses of wine per week    Comment: occ   Drug use: No   Sexual activity: Not on file  Other Topics Concern   Not on file  Social History Narrative   Not on file   Social Determinants of Health   Financial Resource Strain: Not on file  Food Insecurity: Not on file  Transportation Needs: Not on file  Physical Activity: Not on file  Stress: Not on file  Social Connections: Not on file  Intimate Partner Violence: Not on file      Review of systems: All other review of systems negative except as mentioned in the HPI.   Physical Exam: Vitals:   03/18/22 1426  BP: 126/84  Pulse: 66   Body mass index is 35.23 kg/m. Gen:      No acute distress HEENT:  sclera anicteric Abd:      soft, non-tender; no palpable masses, no distension Ext:    No edema Neuro: alert and oriented x 3 Psych: normal mood and affect  Data Reviewed:  Reviewed labs, radiology imaging, old records and pertinent past GI work up   Assessment and Plan/Recommendations:  51 yr very pleasant gentleman with h/o diverticulosis, diverticulitis s/p microperforation s/p sigmoid resection with constipation, incomplete evacuation   Constipation: Idiopathic constipation and dyssynergic defection Continue pelvic floor PT for biofeedback Use Benefiber 1 tablespoon 2-3 times daily with meals Start Linzess 145 mcg daily, advised patient to call if not responding or has excessive diarrhea to titrate the dose   Symptomatic hemorrhoids: will plan for hemorrhoidal banding once constipation improves Advised patient to avoid excessive straining Use Anusol suppository per rectum at bedtime as needed,  avoid using continuously for greater than 5 days at a time   Return in 6 months or sooner if needed  This visit required 40 minutes of patient care (this includes precharting, chart review, review of results, face-to-face time used for counseling as well as treatment plan and follow-up. The patient was provided an opportunity to ask questions and all were answered. The patient agreed with the plan and demonstrated an understanding of the instructions.  Iona Beard , MD    CC: Center, [facility]

## 2022-03-18 NOTE — Patient Instructions (Addendum)
We have sent the following medications to your pharmacy for you to pick up at your convenience: Linzess 145 mcg daily before breakfast.  Hydrocortisone suppository as needed.  _______________________________________________________  If you are age 52 or older, your body mass index should be between 23-30. Your Body mass index is 35.23 kg/m. If this is out of the aforementioned range listed, please consider follow up with your Primary Care Provider.  If you are age 59 or younger, your body mass index should be between 19-25. Your Body mass index is 35.23 kg/m. If this is out of the aformentioned range listed, please consider follow up with your Primary Care Provider.   ________________________________________________________  The [facility] providers would like to encourage you to use [facility] to communicate with providers for non-urgent requests or questions.  Due to long hold times on the telephone, sending your provider a message by [facility] may be a faster and more efficient way to get a response.  Please allow 48 business hours for a response.  Please remember that this is for non-urgent requests.  _______________________________________________________

## 2022-03-27 ENCOUNTER — Encounter: Payer: Self-pay | Admitting: Physical Therapy

## 2022-03-27 ENCOUNTER — Ambulatory Visit: Payer: Commercial Managed Care - PPO | Admitting: Physical Therapy

## 2022-03-27 DIAGNOSIS — R278 Other lack of coordination: Secondary | ICD-10-CM

## 2022-03-27 DIAGNOSIS — M6281 Muscle weakness (generalized): Secondary | ICD-10-CM | POA: Diagnosis not present

## 2022-03-27 DIAGNOSIS — K5902 Outlet dysfunction constipation: Secondary | ICD-10-CM

## 2022-03-27 NOTE — Therapy (Signed)
OUTPATIENT PHYSICAL THERAPY TREATMENT NOTE   Patient Name: Jacob Graham MRN: [id number] DOB:[birth year], 51 y.o., male Today's Date: 03/27/2022  PCP: [facility] REFERRING PROVIDER: Mauri Pole, MD  END OF SESSION:   PT End of Session - 03/27/22 1156     Visit Number 3    Date for PT Re-Evaluation 05/11/22    Authorization Type [employer]    PT Start Time 0800    PT Stop Time 0845    PT Time Calculation (min) 45 min    Activity Tolerance Patient tolerated treatment well    Behavior During Therapy WFL for tasks assessed/performed             Past Medical History:  Diagnosis Date   Asthma    Diverticulitis    IBS (irritable bowel syndrome)    Rectal bleeding    Wears glasses    Past Surgical History:  Procedure Laterality Date   ANAL RECTAL MANOMETRY N/A 07/24/2020   Procedure: ANO RECTAL MANOMETRY;  Surgeon: Mauri Pole, MD;  Location: [facility];  Service: Endoscopy;  Laterality: N/A;   APPENDECTOMY  1999   colonoscopy  2008, 2014   [place], URETEROSCOPY AND STENT PLACEMENT Bilateral 10/25/2019   Procedure: CYSTOSCOPY WITH BILATERAL RETROGRADE, FIREFLY INJECTION  AND URETERAL CATHETER PLACEMENT;  Surgeon: Alexis Frock, MD;  Location: [facility];  Service: Urology;  Laterality: Bilateral;   EVALUATION UNDER ANESTHESIA WITH FISTULECTOMY N/A 10/09/2015   Procedure: EXAM UNDER ANESTHESIA; FISTULOTOMY;  Surgeon: Leighton Ruff, MD;  Location: [place];  Service: General;  Laterality: N/A;   [place]   Patient Active Problem List   Diagnosis Date Noted   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    Dyssynergic defecation    Diverticular disease 10/25/2019   Wears glasses    IBS (irritable bowel syndrome)    Rectal bleeding    Diverticulitis    Asthma    REFERRING DIAG:  K59.02 (ICD-10-CM) - Constipation, outlet dysfunction  K59.02 (ICD-10-CM) - Dyssynergic defecation      THERAPY DIAG:   Muscle weakness (generalized)   Other lack of coordination   Dyssynergic defecation   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 I tried the  dilators 1 time but stopped due to being sore in the rectal area.   PAIN:  Are you having pain? No     PRECAUTIONS: None   WEIGHT BEARING RESTRICTIONS No   FALLS:  Has patient fallen in last 6 months? No   LIVING ENVIRONMENT: Lives with: lives with their spouse     OCCUPATION: full time; walking daily, weights 3 times per week, stretch daily   PLOF: Independent   PATIENT GOALS reduce constipation and understand to use the tools   PERTINENT HISTORY:  IBS; Appendectomy; Fistulectomy     BOWEL MOVEMENT Pain with bowel movement: No Type of bowel movement:Type (Bristol Stool Scale) Type 4 that is long ribbon stool, Frequency daily but small amount, and Strain Yes Fully empty rectum: No Leakage: No Pads: No Fiber supplement: Yes: metamucil but was making it trouble to have a bowel movement   URINATION Pain with urination: No Fully empty bladder: Yes:   Stream: Strong Urgency: No Frequency: getting to urinate has difficulty     OBJECTIVE:    DIAGNOSTIC FINDINGS:  none     COGNITION:            Overall cognitive status: Within functional limits for tasks assessed                          SENSATION:            Light touch: Appears intact            Proprioception: Appears intact                     POSTURE: No Significant postural limitations               PELVIC ALIGNMENT:   LUMBARAROM/PROM full lumbar ROM     LOWER EXTREMITY AROM/PROM:    A/PROM  Right eval Left eval  Hip flexion Pain at endrange Pain at endrange  Hip internal rotation 10 10  Hip external rotation   45   (Blank rows = not tested)   LOWER EXTREMITY MMT:   MMT Right eval Left eval  Hip extension 4/5 4/5  Hip abduction 4/5 4/5    PALPATION: GENERAL not able to push the therapist finger out of the anal canal               External Perineal Exam tenderness located  in the ischiocavernosus               Internal Pelvic Floor tenderness located on the left obturator internist, internal sphincter that is a tight band, iliococcygeus, anococcygeal ligament Patient confirms identification and approves PT to assess internal pelvic floor and treatment Yes   PELVIC MMT:   MMT eval  Internal Anal Sphincter 4/5  External Anal Sphincter 4/5  Puborectalis 4/5  (Blank rows = not tested)     TONE: increased   TODAY'S TREATMENT  03/27/2022 Manual: Soft tissue mobilization:manual work to the ischiocavernosus, urogenital diaphragm, bulbocavernous , iliococcygeus, puborectalis to elongate and reduce muscle tension Trigger Point Dry-Needling  Treatment instructions: Expect mild to moderate muscle soreness. S/S of pneumothorax if dry needled over a lung field, and to seek immediate medical attention should they occur. Patient verbalized understanding of these instructions and education.  Patient Consent Given: Yes Education handout provided: Yes Muscles treated: coccygeus, external anal sphincter, iliococcygeus on the right Electrical stimulation performed: No Parameters: N/A Treatment response/outcome: elongation of muscle and trigger point response  Palpation of muscles to assess for dry needling.   Neuromuscular re-education: Core retraining: Core facilitation: Form correction: Pelvic floor contraction training: Down training:with diaphragmatic breathing in sitting for toileting, diaphragmatic breathing in sidely to relax the pelvic floor with dry needling     03/11/2022   Neuromuscular re-education: Down training:diaphragmatic breathing in supine 10x Diaphragmatic breathing in sitting Exercises: Stretches/mobility: foam roll the ITB Hip adductor foam roll  Piriformis foam roll Therapeutic activities: Functional strengthening activities:sitting with squatty potty with breath and tone to relax the pelvic floor to have a bowel movements Self-care: Education of how the diaphragm and pelvic floor works with breath Education on the pelvic floor and how the rectal dilator works, how to insert the dilator, use the lubricant    EVAL Date:  HEP established-see below      PATIENT EDUCATION:  03/11/2022 Education details: educated patient on how to use the rectal wand internally to work on the trigger points of the pelvic floor. He is to do it every other day. Access Code: [email or web address] Person educated: Patient Education method: Explanation, Demonstration, Tactile cues, and Verbal cues Education comprehension: verbalized understanding, returned demonstration, verbal cues required, tactile cues required, and needs further education     HOME EXERCISE PROGRAM: 03/11/2022 Access Code: [email or web address] URL: [email or web address] Date: 03/11/2022 Prepared by: Earlie Counts   Exercises -- Sidelying IT Band Foam Roll Mobilization  - 1 x daily - 7 x weekly - 3 sets - 10 reps - Hip Flexor Mobilization with Foam Roll  - 1 x daily - 7 x weekly - 3 sets - 10 reps - Piriformis Mobilization on Foam Roll  - 1 x daily - 7 x weekly - 3 sets - 10 reps - Adductor Mobilization with Foam Roll  - 1 x daily - 7 x weekly - 3 sets - 10 reps -  ASSESSMENT:   CLINICAL IMPRESSION: Patient is a 51 y.o. male  who was seen today for physical therapy treatment for constipation and Dyssynergic defecation. Patient has tried the anal dilator and feels it will help but right now the anal tissue is irritated. Patient muscles responded well to the dry needling. Patient is still  working on the diaphragmatic breathing and does feel the pelvic floor relax at times with it. Patient is having a bowel movement more frequently but it is still skinny.  Patient will benefit from skilled therapy to improve constipation, elongation of the pelvic floor and improve the ability to push stool out.      OBJECTIVE IMPAIRMENTS decreased activity tolerance, decreased coordination, decreased endurance, decreased ROM, decreased strength, increased fascial restrictions, increased muscle spasms, and impaired tone.    ACTIVITY LIMITATIONS toileting   PARTICIPATION LIMITATIONS:  none   PERSONAL FACTORS Time since onset of injury/illness/exacerbation are also affecting patient's functional outcome.    REHAB POTENTIAL: Excellent   CLINICAL DECISION MAKING: Stable/uncomplicated   EVALUATION COMPLEXITY: Low     GOALS: Goals reviewed with patient? Yes   SHORT TERM GOALS: Target date: 03/16/2022    Patient understands how to use the anal wand to reduce trigger points in the pelvic floor.  Baseline: Goal status: ongoing.    2.  Patient is independent with use of the anal dilators to open up the anus especially the band on the internal anal sphincter.  Baseline:  Goal status: met 03/27/2022   3.  Patient is independent with hip stretches to elongate the pelvic floor.  Baseline:  Goal status: INITIAL     LONG TERM GOALS:  Target date: 05/11/2022    Patient is independent with HEP for flexibility and coordination of the pelvic floor.  Baseline:  Goal status: INITIAL   2.  Patient is able to push the therapist finger out of the anal canal with correct breath and relaxation of the pelvic floor.  Baseline:  Goal status: INITIAL   3.  Patient stool diameter is the size of nickel or bigger due to elongation of the anal canal.  Baseline:  Goal status: INITIAL   4.  Patient reports his straining with bowel movement has decreased >/= 75% due to elongation of the pelvic floor.   Baseline:  Goal status: INITIAL       PLAN: PT FREQUENCY: 1x/week   PT DURATION: 12 weeks   PLANNED INTERVENTIONS: Therapeutic exercises, Therapeutic activity, Neuromuscular re-education, Patient/Family education, Self Care, Joint mobilization, Dry Needling, Cryotherapy, Moist heat, Taping, Biofeedback, and Manual therapy   PLAN FOR NEXT SESSION: work internally,  dry needling to pelvic floor,  pelvic floor drop   Earlie Counts, PT 03/27/22 12:02 PM

## 2022-03-27 NOTE — Patient Instructions (Signed)

## 2022-04-01 ENCOUNTER — Ambulatory Visit: Payer: Commercial Managed Care - PPO | Admitting: Physical Therapy

## 2022-04-01 ENCOUNTER — Encounter: Payer: Self-pay | Admitting: Physical Therapy

## 2022-04-01 DIAGNOSIS — R278 Other lack of coordination: Secondary | ICD-10-CM

## 2022-04-01 DIAGNOSIS — M6281 Muscle weakness (generalized): Secondary | ICD-10-CM

## 2022-04-01 DIAGNOSIS — K5902 Outlet dysfunction constipation: Secondary | ICD-10-CM

## 2022-04-01 NOTE — Therapy (Signed)
OUTPATIENT PHYSICAL THERAPY TREATMENT NOTE   Patient Name: Jacob Graham MRN: [id number] DOB:Sep 13, 1970, 51 y.o., male Today's Date: 04/01/2022  PCP: [facility] REFERRING PROVIDER: Mauri Pole, MD  END OF SESSION:   PT End of Session - 04/01/22 0845     Visit Number 4    Date for PT Re-Evaluation 05/11/22    Authorization Type [employer]    PT Start Time 0845    PT Stop Time 0925    PT Time Calculation (min) 40 min    Activity Tolerance Patient tolerated treatment well    Behavior During Therapy WFL for tasks assessed/performed             Past Medical History:  Diagnosis Date   Asthma    Diverticulitis    IBS (irritable bowel syndrome)    Rectal bleeding    Wears glasses    Past Surgical History:  Procedure Laterality Date   ANAL RECTAL MANOMETRY N/A 07/24/2020   Procedure: ANO RECTAL MANOMETRY;  Surgeon: Mauri Pole, MD;  Location: [facility];  Service: Endoscopy;  Laterality: N/A;   APPENDECTOMY  1999   colonoscopy  2008, 2014   [place], URETEROSCOPY AND STENT PLACEMENT Bilateral 10/25/2019   Procedure: CYSTOSCOPY WITH BILATERAL RETROGRADE, FIREFLY INJECTION  AND URETERAL CATHETER PLACEMENT;  Surgeon: Alexis Frock, MD;  Location: [facility];  Service: Urology;  Laterality: Bilateral;   EVALUATION UNDER ANESTHESIA WITH FISTULECTOMY N/A 10/09/2015   Procedure: EXAM UNDER ANESTHESIA; FISTULOTOMY;  Surgeon: Leighton Ruff, MD;  Location: [place];  Service: General;  Laterality: N/A;   [place]   Patient Active Problem List   Diagnosis Date Noted   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    Dyssynergic defecation    Diverticular disease 10/25/2019   Wears glasses    IBS (irritable bowel syndrome)    Rectal bleeding    Diverticulitis    Asthma    REFERRING DIAG:  K59.02 (ICD-10-CM) - Constipation, outlet dysfunction  K59.02 (ICD-10-CM) - Dyssynergic defecation      THERAPY DIAG:   Muscle weakness (generalized)   Other lack of coordination   Dyssynergic defecation   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   Rationale for Evaluation and Treatment Rehabilitation   ONSET DATE: 01/03/2022   SUBJECTIVE:                                                                                                                                                                                            SUBJECTIVE STATEMENT: I felt great with the dry needling and able to relax. Stool was easier. I  tried the dilator and the wider part is discomfort.    PAIN:  Are you having pain? No     PRECAUTIONS: None   WEIGHT BEARING RESTRICTIONS No   FALLS:  Has patient fallen in last 6 months? No   LIVING ENVIRONMENT: Lives with: lives with their spouse     OCCUPATION: full time; walking daily, weights 3 times per week, stretch daily   PLOF: Independent   PATIENT GOALS reduce constipation and understand to use the tools   PERTINENT HISTORY:  IBS; Appendectomy; Fistulectomy     BOWEL MOVEMENT Pain with bowel movement: No Type of bowel movement:Type (Bristol Stool Scale) Type 4 that is long ribbon stool, Frequency daily but small amount, and Strain Yes Fully empty rectum: No Leakage: No Pads: No Fiber supplement: Yes: metamucil but was making it trouble to have a bowel movement   URINATION Pain with urination: No Fully empty bladder: Yes:   Stream: Strong Urgency: No Frequency: getting to urinate has difficulty     OBJECTIVE:    DIAGNOSTIC FINDINGS:  none     COGNITION:            Overall cognitive status: Within functional limits for tasks assessed                          SENSATION:            Light touch: Appears intact            Proprioception: Appears intact                     POSTURE: No Significant postural limitations               PELVIC ALIGNMENT:   LUMBARAROM/PROM full lumbar ROM     LOWER EXTREMITY AROM/PROM:    A/PROM Right eval  Left eval  Hip flexion Pain at endrange Pain at endrange  Hip internal rotation 10 10  Hip external rotation   45   (Blank rows = not tested)   LOWER EXTREMITY MMT:   MMT Right eval Left eval  Hip extension 4/5 4/5  Hip abduction 4/5 4/5    PALPATION: GENERAL not able to push the therapist finger out of the anal canal               External Perineal Exam tenderness located  in the ischiocavernosus               Internal Pelvic Floor tenderness located on the left obturator internist, internal sphincter that is a tight band, iliococcygeus, anococcygeal ligament Patient confirms identification and approves PT to assess internal pelvic floor and treatment Yes   PELVIC MMT:   MMT eval  Internal Anal Sphincter 4/5  External Anal Sphincter 4/5  Puborectalis 4/5  (Blank rows = not tested)     TONE: increased   TODAY'S TREATMENT  04/01/2022 Manual: Soft tissue mobilization:to the perineal muscles, ischiocavernosus, transverse perineum, external sphincter, puborectalis to elongate after dry needling and reduce fascial restrictions,  Trigger Point Dry-Needling  Treatment instructions: Expect mild to moderate muscle soreness. S/S of pneumothorax if dry needled over a lung field, and to seek immediate medical attention should they occur. Patient verbalized understanding of these instructions and education.  Patient Consent Given: Yes Education handout provided: Previously provided Muscles treated: external sphincter, perineal body, transverse perineum, ischiocavernosus, puborectalis Electrical stimulation performed: No Parameters: N/A Treatment response/outcome: elongation of muscle and trigger point  response     03/27/2022 Manual: Soft tissue mobilization:manual work to the ischiocavernosus, urogenital diaphragm, bulbocavernous , iliococcygeus, puborectalis to elongate and reduce muscle tension Trigger Point Dry-Needling  Treatment instructions: Expect mild to moderate muscle  soreness. S/S of pneumothorax if dry needled over a lung field, and to seek immediate medical attention should they occur. Patient verbalized understanding of these instructions and education.   Patient Consent Given: Yes Education handout provided: Yes Muscles treated: coccygeus, external anal sphincter, iliococcygeus on the right Electrical stimulation performed: No Parameters: N/A Treatment response/outcome: elongation of muscle and trigger point response Palpation of muscles to assess for dry needling.    Neuromuscular re-education: Core retraining: Core facilitation: Form correction: Pelvic floor contraction training: Down training:with diaphragmatic breathing in sitting for toileting, diaphragmatic breathing in sidely to relax the pelvic floor with dry needling    03/11/2022   Neuromuscular re-education: Down training:diaphragmatic breathing in supine 10x Diaphragmatic breathing in sitting Exercises: Stretches/mobility: foam roll the ITB Hip adductor foam roll  Piriformis foam roll Therapeutic activities: Functional strengthening activities:sitting with squatty potty with breath and tone to relax the pelvic floor to have a bowel movements Self-care: Education of how the diaphragm and pelvic floor works with breath Education on the pelvic floor and how the rectal dilator works, how to insert the dilator, use the lubricant    EVAL Date:  HEP established-see below      PATIENT EDUCATION:  03/11/2022 Education details: educated patient on how to use the rectal wand internally to work on the trigger points of the pelvic floor. He is to do it every other day. Access Code: [email or web address] Person educated: Patient Education method: Explanation, Demonstration, Tactile cues, and Verbal cues Education comprehension: verbalized understanding, returned demonstration, verbal cues required, tactile cues required, and needs further education     HOME EXERCISE PROGRAM: 03/11/2022 Access Code:  [email or web address] URL: [email or web address] Date: 03/11/2022 Prepared by: Earlie Counts   Exercises -- Sidelying IT Band Foam Roll Mobilization  - 1 x daily - 7 x weekly - 3 sets - 10 reps - Hip Flexor Mobilization with Foam Roll  - 1 x daily - 7 x weekly - 3 sets - 10 reps - Piriformis Mobilization on Foam Roll  - 1 x daily - 7 x weekly - 3 sets - 10 reps - Adductor Mobilization with Foam Roll  - 1 x daily - 7 x weekly - 3 sets - 10 reps -  ASSESSMENT:   CLINICAL IMPRESSION: Patient is a 51 y.o. male  who was seen today for physical therapy treatment for constipation and Dyssynergic defecation. Patient was able to have an easier bowel movement after dry needling.  Patient responds well with dry needling and the muscles relax. Patient has tight inner thigh and needs more stretching. Patient will benefit from skilled therapy to improve constipation, elongation of the pelvic floor and improve the ability to push stool out.      OBJECTIVE IMPAIRMENTS decreased activity tolerance, decreased coordination, decreased endurance, decreased ROM, decreased strength, increased fascial restrictions, increased muscle spasms, and impaired tone.    ACTIVITY LIMITATIONS toileting   PARTICIPATION LIMITATIONS:  none   PERSONAL FACTORS Time since onset of injury/illness/exacerbation are also affecting patient's functional outcome.    REHAB POTENTIAL: Excellent   CLINICAL DECISION MAKING: Stable/uncomplicated   EVALUATION COMPLEXITY: Low     GOALS: Goals reviewed with patient? Yes   SHORT TERM GOALS: Target date: 03/16/2022    Patient understands how to use the anal wand to reduce  trigger points in the pelvic floor.  Baseline: Goal status: ongoing.    2.  Patient is independent with use of the anal dilators to open up the anus especially the band on the internal anal sphincter.  Baseline:  Goal status: met 03/27/2022   3.  Patient is independent with hip stretches to elongate the pelvic  floor.  Baseline:  Goal status: Met 04/01/2022     LONG TERM GOALS: Target date: 05/11/2022    Patient is independent with HEP for flexibility and coordination of the pelvic floor.  Baseline:  Goal status: INITIAL   2.  Patient is able to push the therapist finger out of the anal canal with correct breath and relaxation of the pelvic floor.  Baseline:  Goal status: INITIAL   3.  Patient stool diameter is the size of nickel or bigger due to elongation of the anal canal.  Baseline:  Goal status: INITIAL   4.  Patient reports his straining with bowel movement has decreased >/= 75% due to elongation of the pelvic floor.  Baseline:  Goal status: INITIAL       PLAN: PT FREQUENCY: 1x/week   PT DURATION: 12 weeks   PLANNED INTERVENTIONS: Therapeutic exercises, Therapeutic activity, Neuromuscular re-education, Patient/Family education, Self Care, Joint mobilization, Dry Needling, Cryotherapy, Moist heat, Taping, Biofeedback, and Manual therapy   PLAN FOR NEXT SESSION: work internally,  dry needling to pelvic floor,  pelvic floor drop  Earlie Counts, PT 04/01/22 9:32 AM

## 2022-04-10 ENCOUNTER — Ambulatory Visit: Payer: Commercial Managed Care - PPO | Attending: Gastroenterology | Admitting: Physical Therapy

## 2022-04-10 ENCOUNTER — Encounter: Payer: Self-pay | Admitting: Physical Therapy

## 2022-04-10 DIAGNOSIS — K5902 Outlet dysfunction constipation: Secondary | ICD-10-CM | POA: Diagnosis present

## 2022-04-10 DIAGNOSIS — R278 Other lack of coordination: Secondary | ICD-10-CM | POA: Insufficient documentation

## 2022-04-10 DIAGNOSIS — M6281 Muscle weakness (generalized): Secondary | ICD-10-CM | POA: Diagnosis present

## 2022-04-10 NOTE — Therapy (Signed)
OUTPATIENT PHYSICAL THERAPY TREATMENT NOTE   Patient Name: Jacob Graham MRN: [id number] DOB:Apr 30, 1971, 51 y.o., male Today's Date: 04/10/2022  PCP: [facility] REFERRING PROVIDER: Mauri Pole, MD  END OF SESSION:   PT End of Session - 04/10/22 0849     Visit Number 5    Date for PT Re-Evaluation 05/11/22    Authorization Type [employer]    PT Start Time 0845    PT Stop Time 0925    PT Time Calculation (min) 40 min    Activity Tolerance Patient tolerated treatment well    Behavior During Therapy WFL for tasks assessed/performed             Past Medical History:  Diagnosis Date   Asthma    Diverticulitis    IBS (irritable bowel syndrome)    Rectal bleeding    Wears glasses    Past Surgical History:  Procedure Laterality Date   ANAL RECTAL MANOMETRY N/A 07/24/2020   Procedure: ANO RECTAL MANOMETRY;  Surgeon: Mauri Pole, MD;  Location: [facility];  Service: Endoscopy;  Laterality: N/A;   APPENDECTOMY  1999   colonoscopy  2008, 2014   [place], URETEROSCOPY AND STENT PLACEMENT Bilateral 10/25/2019   Procedure: CYSTOSCOPY WITH BILATERAL RETROGRADE, FIREFLY INJECTION  AND URETERAL CATHETER PLACEMENT;  Surgeon: Alexis Frock, MD;  Location: [facility];  Service: Urology;  Laterality: Bilateral;   EVALUATION UNDER ANESTHESIA WITH FISTULECTOMY N/A 10/09/2015   Procedure: EXAM UNDER ANESTHESIA; FISTULOTOMY;  Surgeon: Leighton Ruff, MD;  Location: [place];  Service: General;  Laterality: N/A;   [place]   Patient Active Problem List   Diagnosis Date Noted   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    Dyssynergic defecation    Diverticular disease 10/25/2019   Wears glasses    IBS (irritable bowel syndrome)    Rectal bleeding    Diverticulitis    Asthma    REFERRING DIAG:  K59.02 (ICD-10-CM) - Constipation, outlet dysfunction  K59.02 (ICD-10-CM) - Dyssynergic defecation      THERAPY DIAG:   Muscle weakness (generalized)   Other lack of coordination   Dyssynergic defecation   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   Rationale for Evaluation and Treatment Rehabilitation   ONSET DATE: 01/03/2022   SUBJECTIVE:                                                                                                                                                                                            SUBJECTIVE STATEMENT: I feel opened up in the pelvic floor. Easier to have a bowel movement. I  had 2 nights that I had trouble going to the bathroom but now back to my stretches.    PAIN:  Are you having pain? No     PRECAUTIONS: None   WEIGHT BEARING RESTRICTIONS No   FALLS:  Has patient fallen in last 6 months? No   LIVING ENVIRONMENT: Lives with: lives with their spouse     OCCUPATION: full time; walking daily, weights 3 times per week, stretch daily   PLOF: Independent   PATIENT GOALS reduce constipation and understand to use the tools   PERTINENT HISTORY:  IBS; Appendectomy; Fistulectomy     BOWEL MOVEMENT Pain with bowel movement: No Type of bowel movement:Type (Bristol Stool Scale) Type 4 that is long ribbon stool, Frequency daily but small amount, and Strain Yes Fully empty rectum: No Leakage: No Pads: No Fiber supplement: Yes: metamucil but was making it trouble to have a bowel movement   URINATION Pain with urination: No Fully empty bladder: Yes:   Stream: Strong Urgency: No Frequency: getting to urinate has difficulty     OBJECTIVE:    DIAGNOSTIC FINDINGS:  none     COGNITION:            Overall cognitive status: Within functional limits for tasks assessed                          SENSATION:            Light touch: Appears intact            Proprioception: Appears intact                     POSTURE: No Significant postural limitations               PELVIC ALIGNMENT:   LUMBARAROM/PROM full lumbar ROM     LOWER EXTREMITY  AROM/PROM:    A/PROM Right eval Left eval Left  Hip flexion Pain at endrange Pain at endrange Full no pain  Hip internal rotation 10 10   Hip external rotation   45 80   (Blank rows = not tested)   LOWER EXTREMITY MMT:   MMT Right eval Left eval  Hip extension 4/5 4/5  Hip abduction 4/5 4/5    PALPATION: GENERAL not able to push the therapist finger out of the anal canal               External Perineal Exam tenderness located  in the ischiocavernosus               Internal Pelvic Floor tenderness located on the left obturator internist, internal sphincter that is a tight band, iliococcygeus, anococcygeal ligament Patient confirms identification and approves PT to assess internal pelvic floor and treatment Yes   PELVIC MMT:   MMT eval  Internal Anal Sphincter 4/5  External Anal Sphincter 4/5  Puborectalis 4/5  (Blank rows = not tested)     TONE: increased   TODAY'S TREATMENT  04/10/2022 Manual: Soft tissue mobilization:manual work to left hip adductor, left ischiocavernosus, left perineal body, left superior transverse perineum to elongate after dry needling  Spinal mobilization:Joint mobilization to left hip grade 3 for distraction, posterior glide Trigger Point Dry-Needling  Treatment instructions: Expect mild to moderate muscle soreness. S/S of pneumothorax if dry needled over a lung field, and to seek immediate medical attention should they occur. Patient verbalized understanding of these instructions and education.  Patient Consent Given: Yes Education handout  provided: Previously provided Muscles treated: left hip adductors, left ischiocavernosus, left puborectalis, left superior transverse perineum Electrical stimulation performed: No Parameters: N/A Treatment response/outcome: elongation of muscle, trigger point response.  Self-care: Educated patient on using heat on the rectum when he has trouble going to the bathroom and ice after having a bowel  movement Discussed toileting techniques and how it is going with the dilator    04/01/2022 Manual: Soft tissue mobilization:to the perineal muscles, ischiocavernosus, transverse perineum, external sphincter, puborectalis to elongate after dry needling and reduce fascial restrictions,  Trigger Point Dry-Needling  Treatment instructions: Expect mild to moderate muscle soreness. S/S of pneumothorax if dry needled over a lung field, and to seek immediate medical attention should they occur. Patient verbalized understanding of these instructions and education.   Patient Consent Given: Yes Education handout provided: Previously provided Muscles treated: external sphincter, perineal body, transverse perineum, ischiocavernosus, puborectalis Electrical stimulation performed: No Parameters: N/A Treatment response/outcome: elongation of muscle and trigger point response      03/27/2022 Manual: Soft tissue mobilization:manual work to the ischiocavernosus, urogenital diaphragm, bulbocavernous , iliococcygeus, puborectalis to elongate and reduce muscle tension Trigger Point Dry-Needling  Treatment instructions: Expect mild to moderate muscle soreness. S/S of pneumothorax if dry needled over a lung field, and to seek immediate medical attention should they occur. Patient verbalized understanding of these instructions and education.   Patient Consent Given: Yes Education handout provided: Yes Muscles treated: coccygeus, external anal sphincter, iliococcygeus on the right Electrical stimulation performed: No Parameters: N/A Treatment response/outcome: elongation of muscle and trigger point response Palpation of muscles to assess for dry needling.    Neuromuscular re-education: Core retraining: Core facilitation: Form correction: Pelvic floor contraction training: Down training:with diaphragmatic breathing in sitting for toileting, diaphragmatic breathing in sidely to relax the pelvic floor with dry  needling        PATIENT EDUCATION:  03/11/2022 Education details: educated patient on how to use the rectal wand internally to work on the trigger points of the pelvic floor. He is to do it every other day. Access Code: [email or web address] Person educated: Patient Education method: Explanation, Demonstration, Tactile cues, and Verbal cues Education comprehension: verbalized understanding, returned demonstration, verbal cues required, tactile cues required, and needs further education     HOME EXERCISE PROGRAM: 03/11/2022 Access Code: [email or web address] URL: [email or web address] Date: 03/11/2022 Prepared by: Earlie Counts   Exercises -- Sidelying IT Band Foam Roll Mobilization  - 1 x daily - 7 x weekly - 3 sets - 10 reps - Hip Flexor Mobilization with Foam Roll  - 1 x daily - 7 x weekly - 3 sets - 10 reps - Piriformis Mobilization on Foam Roll  - 1 x daily - 7 x weekly - 3 sets - 10 reps - Adductor Mobilization with Foam Roll  - 1 x daily - 7 x weekly - 3 sets - 10 reps -  ASSESSMENT:   CLINICAL IMPRESSION: Patient is a 51 y.o. male  who was seen today for physical therapy treatment for constipation and Dyssynergic defecation. Patient is able to feel the pelvic floor drop and able to let out gas easier. Patient is using the dilator with greater ease. He is not straining as much. Patient will benefit from skilled therapy to improve constipation, elongation of the pelvic floor and improve the ability to push stool out.      OBJECTIVE IMPAIRMENTS decreased activity tolerance, decreased coordination, decreased endurance, decreased ROM, decreased strength, increased fascial restrictions, increased muscle spasms, and impaired tone.  ACTIVITY LIMITATIONS toileting   PARTICIPATION LIMITATIONS:  none   PERSONAL FACTORS Time since onset of injury/illness/exacerbation are also affecting patient's functional outcome.    REHAB POTENTIAL: Excellent   CLINICAL DECISION MAKING: Stable/uncomplicated    EVALUATION COMPLEXITY: Low     GOALS: Goals reviewed with patient? Yes   SHORT TERM GOALS: Target date: 03/16/2022    Patient understands how to use the anal wand to reduce trigger points in the pelvic floor.  Baseline: Goal status: ongoing.    2.  Patient is independent with use of the anal dilators to open up the anus especially the band on the internal anal sphincter.  Baseline:  Goal status: met 03/27/2022   3.  Patient is independent with hip stretches to elongate the pelvic floor.  Baseline:  Goal status: Met 04/01/2022     LONG TERM GOALS: Target date: 05/11/2022    Patient is independent with HEP for flexibility and coordination of the pelvic floor.  Baseline:  Goal status: ongoing 04/10/2022   2.  Patient is able to push the therapist finger out of the anal canal with correct breath and relaxation of the pelvic floor.  Baseline:  Goal status: ongoing 04/10/2022   3.  Patient stool diameter is the size of nickel or bigger due to elongation of the anal canal.  Baseline:  Goal status: ongoing 04/10/2022   4.  Patient reports his straining with bowel movement has decreased >/= 75% due to elongation of the pelvic floor.  Baseline:  Goal status: INITIAL       PLAN: PT FREQUENCY: 1x/week   PT DURATION: 12 weeks   PLANNED INTERVENTIONS: Therapeutic exercises, Therapeutic activity, Neuromuscular re-education, Patient/Family education, Self Care, Joint mobilization, Dry Needling, Cryotherapy, Moist heat, Taping, Biofeedback, and Manual therapy   PLAN FOR NEXT SESSION: work internally,  dry needling to pelvic floor, see about left hip adductor Earlie Counts, PT 04/10/22 9:32 AM

## 2022-04-15 ENCOUNTER — Encounter: Payer: Self-pay | Admitting: Physical Therapy

## 2022-04-15 ENCOUNTER — Ambulatory Visit: Payer: Commercial Managed Care - PPO | Admitting: Physical Therapy

## 2022-04-15 DIAGNOSIS — M6281 Muscle weakness (generalized): Secondary | ICD-10-CM

## 2022-04-15 DIAGNOSIS — K5902 Outlet dysfunction constipation: Secondary | ICD-10-CM

## 2022-04-15 DIAGNOSIS — R278 Other lack of coordination: Secondary | ICD-10-CM

## 2022-04-15 NOTE — Therapy (Signed)
OUTPATIENT PHYSICAL THERAPY TREATMENT NOTE   Patient Name: Jacob Graham MRN: [id number] DOB:August 04, 1970, 51 y.o., male Today's Date: 04/15/2022  PCP: [facility] REFERRING PROVIDER: Mauri Pole, MD  END OF SESSION:   PT End of Session - 04/15/22 0801     Visit Number 6    Date for PT Re-Evaluation 05/11/22    Authorization Type [employer]    PT Start Time 0800    PT Stop Time 0840    PT Time Calculation (min) 40 min    Activity Tolerance Patient tolerated treatment well    Behavior During Therapy WFL for tasks assessed/performed             Past Medical History:  Diagnosis Date   Asthma    Diverticulitis    IBS (irritable bowel syndrome)    Rectal bleeding    Wears glasses    Past Surgical History:  Procedure Laterality Date   ANAL RECTAL MANOMETRY N/A 07/24/2020   Procedure: ANO RECTAL MANOMETRY;  Surgeon: Mauri Pole, MD;  Location: [facility];  Service: Endoscopy;  Laterality: N/A;   APPENDECTOMY  1999   colonoscopy  2008, 2014   [place], URETEROSCOPY AND STENT PLACEMENT Bilateral 10/25/2019   Procedure: CYSTOSCOPY WITH BILATERAL RETROGRADE, FIREFLY INJECTION  AND URETERAL CATHETER PLACEMENT;  Surgeon: Alexis Frock, MD;  Location: [facility];  Service: Urology;  Laterality: Bilateral;   EVALUATION UNDER ANESTHESIA WITH FISTULECTOMY N/A 10/09/2015   Procedure: EXAM UNDER ANESTHESIA; FISTULOTOMY;  Surgeon: Leighton Ruff, MD;  Location: [place];  Service: General;  Laterality: N/A;   [place]   Patient Active Problem List   Diagnosis Date Noted   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    Dyssynergic defecation    Diverticular disease 10/25/2019   Wears glasses    IBS (irritable bowel syndrome)    Rectal bleeding    Diverticulitis    Asthma    REFERRING DIAG:  K59.02 (ICD-10-CM) - Constipation, outlet dysfunction  K59.02 (ICD-10-CM) - Dyssynergic defecation      THERAPY DIAG:   Muscle weakness (generalized)   Other lack of coordination   Dyssynergic defecation   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   Rationale for Evaluation and Treatment Rehabilitation   ONSET DATE: 01/03/2022   SUBJECTIVE:                                                                                                                                                                                            SUBJECTIVE STATEMENT: . I am using the dilator more and wonder if I should go up a  size.    PAIN:  Are you having pain? No     PRECAUTIONS: None   WEIGHT BEARING RESTRICTIONS No   FALLS:  Has patient fallen in last 6 months? No   LIVING ENVIRONMENT: Lives with: lives with their spouse     OCCUPATION: full time; walking daily, weights 3 times per week, stretch daily   PLOF: Independent   PATIENT GOALS reduce constipation and understand to use the tools   PERTINENT HISTORY:  IBS; Appendectomy; Fistulectomy     BOWEL MOVEMENT Pain with bowel movement: No Type of bowel movement:Type (Bristol Stool Scale) Type 4 that is long ribbon stool, Frequency daily but small amount, and Strain Yes Fully empty rectum: No Leakage: No Pads: No Fiber supplement: Yes: metamucil but was making it trouble to have a bowel movement   URINATION Pain with urination: No Fully empty bladder: Yes:   Stream: Strong Urgency: No Frequency: getting to urinate has difficulty     OBJECTIVE:    DIAGNOSTIC FINDINGS:  none     COGNITION:            Overall cognitive status: Within functional limits for tasks assessed                          SENSATION:            Light touch: Appears intact            Proprioception: Appears intact                     POSTURE: No Significant postural limitations               PELVIC ALIGNMENT:   LUMBARAROM/PROM full lumbar ROM     LOWER EXTREMITY AROM/PROM:    A/PROM Right eval Left eval Left  Hip flexion Pain at endrange Pain at  endrange Full no pain  Hip internal rotation 10 10    Hip external rotation   45 80   (Blank rows = not tested)   LOWER EXTREMITY MMT:   MMT Right eval Left eval  Hip extension 4/5 4/5  Hip abduction 4/5 4/5    PALPATION: GENERAL not able to push the therapist finger out of the anal canal               External Perineal Exam tenderness located  in the ischiocavernosus               Internal Pelvic Floor tenderness located on the left obturator internist, internal sphincter that is a tight band, iliococcygeus, anococcygeal ligament Patient confirms identification and approves PT to assess internal pelvic floor and treatment Yes   PELVIC MMT:   MMT eval  Internal Anal Sphincter 4/5  External Anal Sphincter 4/5  Puborectalis 4/5  (Blank rows = not tested)     TONE: increased   TODAY'S TREATMENT  04/15/2022 Manual: Soft tissue mobilization: to assess for dry needling to the left pelvic floor muscles; release of the left ischiocavernosus and perineal body to elongate after dry needling.  Internal pelvic floor techniques:No emotional/communication barriers or cognitive limitation. Patient is motivated to learn. Patient understands and agrees with treatment goals and plan. PT explains patient will be examined in standing, sitting, and lying down to see how their muscles and joints work. When they are ready, they will be asked to remove their underwear so PT can examine their perineum. The patient is also given the option  of providing their own chaperone as one is not provided in our facility. The patient also has the right and is explained the right to defer or refuse any part of the evaluation or treatment including the internal exam. With the patient's consent, PT will use one gloved finger to gently assess the muscles of the pelvic floor, seeing how well it contracts and relaxes and if there is muscle symmetry. After, the patient will get dressed and PT and patient will discuss exam  findings and plan of care. PT and patient discuss plan of care, schedule, attendance policy and HEP activities.  Going through the anus to work on the left side and anterior sphincter muscles with fascial release and manual work to elongate the muscles.  Trigger Point Dry-Needling  Treatment instructions: Expect mild to moderate muscle soreness. S/S of pneumothorax if dry needled over a lung field, and to seek immediate medical attention should they occur. Patient verbalized understanding of these instructions and education.  Patient Consent Given: Yes Education handout provided: Previously provided Muscles treated: left ischiocavernosus, perineal body, superior transverse perineum, puborectalis external anal sphincter Electrical stimulation performed: No Parameters: N/A Treatment response/outcome: elongation   Self-care: Reviewed on how to massage the perineal with the ball or foam roll Educated patient on how to move up to the next size dilator Educated patient on how to use the dilator on the commode to push it out to educate the muscles to relax    04/10/2022 Manual: Soft tissue mobilization:manual work to left hip adductor, left ischiocavernosus, left perineal body, left superior transverse perineum to elongate after dry needling  Spinal mobilization:Joint mobilization to left hip grade 3 for distraction, posterior glide Trigger Point Dry-Needling  Treatment instructions: Expect mild to moderate muscle soreness. S/S of pneumothorax if dry needled over a lung field, and to seek immediate medical attention should they occur. Patient verbalized understanding of these instructions and education.   Patient Consent Given: Yes Education handout provided: Previously provided Muscles treated: left hip adductors, left ischiocavernosus, left puborectalis, left superior transverse perineum Electrical stimulation performed: No Parameters: N/A Treatment response/outcome: elongation of muscle,  trigger point response.  Self-care: Educated patient on using heat on the rectum when he has trouble going to the bathroom and ice after having a bowel movement Discussed toileting techniques and how it is going with the dilator     04/01/2022 Manual: Soft tissue mobilization:to the perineal muscles, ischiocavernosus, transverse perineum, external sphincter, puborectalis to elongate after dry needling and reduce fascial restrictions,  Trigger Point Dry-Needling  Treatment instructions: Expect mild to moderate muscle soreness. S/S of pneumothorax if dry needled over a lung field, and to seek immediate medical attention should they occur. Patient verbalized understanding of these instructions and education.   Patient Consent Given: Yes Education handout provided: Previously provided Muscles treated: external sphincter, perineal body, transverse perineum, ischiocavernosus, puborectalis Electrical stimulation performed: No Parameters: N/A Treatment response/outcome: elongation of muscle and trigger point response      PATIENT EDUCATION:  [id number] Education details: Access Code: [email or web address], educated patient on how to move up to the next dilator, educated patient on how to use the dilator to push it out to facilitate a bowel movement Person educated: Patient Education method: Explanation, Demonstration, Tactile cues, and Verbal cues Education comprehension: verbalized understanding, returned demonstration, verbal cues required, tactile cues required, and needs further education     HOME EXERCISE PROGRAM: 03/11/2022 Access Code: [email or web address] URL: [email or web address] Date: 03/11/2022 Prepared by: Earlie Counts   Exercises -- Sidelying  IT Band Foam Roll Mobilization  - 1 x daily - 7 x weekly - 3 sets - 10 reps - Hip Flexor Mobilization with Foam Roll  - 1 x daily - 7 x weekly - 3 sets - 10 reps - Piriformis Mobilization on Foam Roll  - 1 x daily - 7 x weekly - 3 sets - 10 reps -  Adductor Mobilization with Foam Roll  - 1 x daily - 7 x weekly - 3 sets - 10 reps -  ASSESSMENT:   CLINICAL IMPRESSION: Patient is a 51 y.o. male  who was seen today for physical therapy treatment for constipation and Dyssynergic defecation. Patient is able to feel the pelvic floor drop and able to let out gas easier. Patient is using the dilator with greater ease. He is not straining as much. Patient is using the dilator several times per week and is ready to use the next size. He is able to feel the pelvic floor relax now. Patient still has restrictions in the anal region but are less than they were at the initial eval. Patient will benefit from skilled therapy to improve constipation, elongation of the pelvic floor and improve the ability to push stool out.      OBJECTIVE IMPAIRMENTS decreased activity tolerance, decreased coordination, decreased endurance, decreased ROM, decreased strength, increased fascial restrictions, increased muscle spasms, and impaired tone.    ACTIVITY LIMITATIONS toileting   PARTICIPATION LIMITATIONS:  none   PERSONAL FACTORS Time since onset of injury/illness/exacerbation are also affecting patient's functional outcome.    REHAB POTENTIAL: Excellent   CLINICAL DECISION MAKING: Stable/uncomplicated   EVALUATION COMPLEXITY: Low     GOALS: Goals reviewed with patient? Yes   SHORT TERM GOALS: Target date: 03/16/2022    Patient understands how to use the anal wand to reduce trigger points in the pelvic floor.  Baseline: Goal status: ongoing.    2.  Patient is independent with use of the anal dilators to open up the anus especially the band on the internal anal sphincter.  Baseline:  Goal status: met 03/27/2022   3.  Patient is independent with hip stretches to elongate the pelvic floor.  Baseline:  Goal status: Met 04/01/2022     LONG TERM GOALS: Target date: 05/11/2022    Patient is independent with HEP for flexibility and coordination of the pelvic  floor.  Baseline:  Goal status: ongoing 04/10/2022   2.  Patient is able to push the therapist finger out of the anal canal with correct breath and relaxation of the pelvic floor.  Baseline:  Goal status: ongoing 04/10/2022   3.  Patient stool diameter is the size of nickel or bigger due to elongation of the anal canal.  Baseline:  Goal status: ongoing 04/10/2022   4.  Patient reports his straining with bowel movement has decreased >/= 75% due to elongation of the pelvic floor.  Baseline:  Goal status: INITIAL       PLAN: PT FREQUENCY: 1x/week   PT DURATION: 12 weeks   PLANNED INTERVENTIONS: Therapeutic exercises, Therapeutic activity, Neuromuscular re-education, Patient/Family education, Self Care, Joint mobilization, Dry Needling, Cryotherapy, Moist heat, Taping, Biofeedback, and Manual therapy   PLAN FOR NEXT SESSION: work internally on the right side,  dry needling to pelvic floor, see about left hip adductor see how it goes with pushing the dilator out, ask about the size of the stool Earlie Counts, PT 04/15/22 9:03 AM

## 2022-04-22 ENCOUNTER — Encounter: Payer: Self-pay | Admitting: Physical Therapy

## 2022-04-22 ENCOUNTER — Ambulatory Visit: Payer: Commercial Managed Care - PPO | Admitting: Physical Therapy

## 2022-04-22 DIAGNOSIS — M6281 Muscle weakness (generalized): Secondary | ICD-10-CM

## 2022-04-22 DIAGNOSIS — R278 Other lack of coordination: Secondary | ICD-10-CM

## 2022-04-22 DIAGNOSIS — K5902 Outlet dysfunction constipation: Secondary | ICD-10-CM

## 2022-04-22 NOTE — Therapy (Signed)
OUTPATIENT PHYSICAL THERAPY TREATMENT NOTE   Patient Name: Jacob Graham MRN: [id number] DOB:1970/08/05, 51 y.o., male Today's Date: 04/22/2022  PCP: [facility]  REFERRING PROVIDER: Mauri Pole, MD  END OF SESSION:   PT End of Session - 04/22/22 0758     Visit Number 7    Date for PT Re-Evaluation 05/11/22    Authorization Type [employer]    PT Start Time 0800    PT Stop Time 0840    PT Time Calculation (min) 40 min    Activity Tolerance Patient tolerated treatment well    Behavior During Therapy WFL for tasks assessed/performed             Past Medical History:  Diagnosis Date   Asthma    Diverticulitis    IBS (irritable bowel syndrome)    Rectal bleeding    Wears glasses    Past Surgical History:  Procedure Laterality Date   ANAL RECTAL MANOMETRY N/A 07/24/2020   Procedure: ANO RECTAL MANOMETRY;  Surgeon: Mauri Pole, MD;  Location: [facility];  Service: Endoscopy;  Laterality: N/A;   APPENDECTOMY  1999   colonoscopy  2008, 2014   [place], URETEROSCOPY AND STENT PLACEMENT Bilateral 10/25/2019   Procedure: CYSTOSCOPY WITH BILATERAL RETROGRADE, FIREFLY INJECTION  AND URETERAL CATHETER PLACEMENT;  Surgeon: Alexis Frock, MD;  Location: [facility];  Service: Urology;  Laterality: Bilateral;   EVALUATION UNDER ANESTHESIA WITH FISTULECTOMY N/A 10/09/2015   Procedure: EXAM UNDER ANESTHESIA; FISTULOTOMY;  Surgeon: Leighton Ruff, MD;  Location: [place];  Service: General;  Laterality: N/A;   [place]   Patient Active Problem List   Diagnosis Date Noted   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    Dyssynergic defecation    Diverticular disease 10/25/2019   Wears glasses    IBS (irritable bowel syndrome)    Rectal bleeding    Diverticulitis    Asthma    REFERRING DIAG:  K59.02 (ICD-10-CM) - Constipation, outlet dysfunction  K59.02 (ICD-10-CM) - Dyssynergic defecation      THERAPY  DIAG:  Muscle weakness (generalized)   Other lack of coordination   Dyssynergic defecation   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   Rationale for Evaluation and Treatment Rehabilitation   ONSET DATE: 01/03/2022   SUBJECTIVE:                                                                                                                                                                                            SUBJECTIVE STATEMENT: I have not did the bigger dilator yet. I have been increasing the dosage  of Miralax. I have been diligent with stretching.    PAIN:  Are you having pain? No     PRECAUTIONS: None   WEIGHT BEARING RESTRICTIONS No   FALLS:  Has patient fallen in last 6 months? No   LIVING ENVIRONMENT: Lives with: lives with their spouse     OCCUPATION: full time; walking daily, weights 3 times per week, stretch daily   PLOF: Independent   PATIENT GOALS reduce constipation and understand to use the tools   PERTINENT HISTORY:  IBS; Appendectomy; Fistulectomy     BOWEL MOVEMENT Pain with bowel movement: No Type of bowel movement:Type (Bristol Stool Scale) Type 4 that is long ribbon stool, Frequency daily but small amount, and Strain Yes Fully empty rectum: No Leakage: No Pads: No Fiber supplement: Yes: metamucil but was making it trouble to have a bowel movement   URINATION Pain with urination: No Fully empty bladder: Yes:   Stream: Strong Urgency: No Frequency: getting to urinate has difficulty     OBJECTIVE:    DIAGNOSTIC FINDINGS:  none     COGNITION:            Overall cognitive status: Within functional limits for tasks assessed                          SENSATION:            Light touch: Appears intact            Proprioception: Appears intact                     POSTURE: No Significant postural limitations               PELVIC ALIGNMENT:   LUMBARAROM/PROM full lumbar ROM     LOWER EXTREMITY AROM/PROM:    A/PROM Right eval  Left eval Left  Hip flexion Pain at endrange Pain at endrange Full no pain  Hip internal rotation 10 10    Hip external rotation   45 80   (Blank rows = not tested)   LOWER EXTREMITY MMT:   MMT Right eval Left eval  Hip extension 4/5 4/5  Hip abduction 4/5 4/5    PALPATION: GENERAL not able to push the therapist finger out of the anal canal               External Perineal Exam tenderness located  in the ischiocavernosus               Internal Pelvic Floor tenderness located on the left obturator internist, internal sphincter that is a tight band, iliococcygeus, anococcygeal ligament Patient confirms identification and approves PT to assess internal pelvic floor and treatment Yes   PELVIC MMT:   MMT eval  Internal Anal Sphincter 4/5  External Anal Sphincter 4/5  Puborectalis 4/5  (Blank rows = not tested)     TONE: increased   TODAY'S TREATMENT  04/22/2022 Manual: Soft tissue mobilization:to assess for dry needling, manual work to the ischiocavernosus, superior transverse perineum, and perineal body to elongate after dry needling Internal pelvic floor techniques:Manual  work to the puborectalis anteriorly and the anterior sphincter muscles going through the fascial layers Trigger Point Dry-Needling  Treatment instructions: Expect mild to moderate muscle soreness. S/S of pneumothorax if dry needled over a lung field, and to seek immediate medical attention should they occur. Patient verbalized understanding of these instructions and education.  Patient Consent Given: Yes Education handout  provided: Previously provided Muscles treated:  right ischiocavernosus, perineal body, puborectalis, superior transverse perineum Electrical stimulation performed: No Parameters: N/A Treatment response/outcome:elongation of muscle and trigger point response Neuromuscular re-education: Form correction:siting with ball between knees and move forward and back with gluteal contraction  15x Exercises: Stretches/mobility:hip stretches with foot on the bed to open up the left anterior hip.  Self-care: Educated patient on ways to manage constipation, eat at regular meals, drink water, use garbage can for squatty potty and massage the anus    04/15/2022 Manual: Soft tissue mobilization: to assess for dry needling to the left pelvic floor muscles; release of the left ischiocavernosus and perineal body to elongate after dry needling.  Internal pelvic floor techniques:No emotional/communication barriers or cognitive limitation. Patient is motivated to learn. Patient understands and agrees with treatment goals and plan. PT explains patient will be examined in standing, sitting, and lying down to see how their muscles and joints work. When they are ready, they will be asked to remove their underwear so PT can examine their perineum. The patient is also given the option of providing their own chaperone as one is not provided in our facility. The patient also has the right and is explained the right to defer or refuse any part of the evaluation or treatment including the internal exam. With the patient's consent, PT will use one gloved finger to gently assess the muscles of the pelvic floor, seeing how well it contracts and relaxes and if there is muscle symmetry. After, the patient will get dressed and PT and patient will discuss exam findings and plan of care. PT and patient discuss plan of care, schedule, attendance policy and HEP activities.  Going through the anus to work on the left side and anterior sphincter muscles with fascial release and manual work to elongate the muscles.  Trigger Point Dry-Needling  Treatment instructions: Expect mild to moderate muscle soreness. S/S of pneumothorax if dry needled over a lung field, and to seek immediate medical attention should they occur. Patient verbalized understanding of these instructions and education.   Patient Consent Given: Yes Education  handout provided: Previously provided Muscles treated: left ischiocavernosus, perineal body, superior transverse perineum, puborectalis external anal sphincter Electrical stimulation performed: No Parameters: N/A Treatment response/outcome: elongation    Self-care: Reviewed on how to massage the perineal with the ball or foam roll Educated patient on how to move up to the next size dilator Educated patient on how to use the dilator on the commode to push it out to educate the muscles to relax    04/10/2022 Manual: Soft tissue mobilization:manual work to left hip adductor, left ischiocavernosus, left perineal body, left superior transverse perineum to elongate after dry needling  Spinal mobilization:Joint mobilization to left hip grade 3 for distraction, posterior glide Trigger Point Dry-Needling  Treatment instructions: Expect mild to moderate muscle soreness. S/S of pneumothorax if dry needled over a lung field, and to seek immediate medical attention should they occur. Patient verbalized understanding of these instructions and education.   Patient Consent Given: Yes Education handout provided: Previously provided Muscles treated: left hip adductors, left ischiocavernosus, left puborectalis, left superior transverse perineum Electrical stimulation performed: No Parameters: N/A Treatment response/outcome: elongation of muscle, trigger point response.  Self-care: Educated patient on using heat on the rectum when he has trouble going to the bathroom and ice after having a bowel movement Discussed toileting techniques and how it is going with the dilator     PATIENT EDUCATION:  [id number] Education  details: Access Code: [email or web address], educated patient on how to move up to the next dilator, educated patient on how to use the dilator to push it out to facilitate a bowel movement Person educated: Patient Education method: Explanation, Demonstration, Tactile cues, and Verbal cues Education  comprehension: verbalized understanding, returned demonstration, verbal cues required, tactile cues required, and needs further education     HOME EXERCISE PROGRAM: 03/11/2022 Access Code: [email or web address] URL: [email or web address] Date: 03/11/2022 Prepared by: Earlie Counts   Exercises -- Sidelying IT Band Foam Roll Mobilization  - 1 x daily - 7 x weekly - 3 sets - 10 reps - Hip Flexor Mobilization with Foam Roll  - 1 x daily - 7 x weekly - 3 sets - 10 reps - Piriformis Mobilization on Foam Roll  - 1 x daily - 7 x weekly - 3 sets - 10 reps - Adductor Mobilization with Foam Roll  - 1 x daily - 7 x weekly - 3 sets - 10 reps -  ASSESSMENT:   CLINICAL IMPRESSION: Patient is a 51 y.o. male  who was seen today for physical therapy treatment for constipation and Dyssynergic defecation. Stool shape is ribbon form.  Therapist was able to place her index finger into the rectum with greater ease. He continues to have trigger points in the pelvic floor muscles. Patient pelvic floor will tighten if he is having stress and he is working on reducing it. Patient still strains to have a bowel movment. Patient will benefit from skilled therapy to improve constipation, elongation of the pelvic floor and improve the ability to push stool out.      OBJECTIVE IMPAIRMENTS decreased activity tolerance, decreased coordination, decreased endurance, decreased ROM, decreased strength, increased fascial restrictions, increased muscle spasms, and impaired tone.    ACTIVITY LIMITATIONS toileting   PARTICIPATION LIMITATIONS:  none   PERSONAL FACTORS Time since onset of injury/illness/exacerbation are also affecting patient's functional outcome.    REHAB POTENTIAL: Excellent   CLINICAL DECISION MAKING: Stable/uncomplicated   EVALUATION COMPLEXITY: Low     GOALS: Goals reviewed with patient? Yes   SHORT TERM GOALS: Target date: 03/16/2022    Patient understands how to use the anal wand to reduce trigger  points in the pelvic floor.  Baseline: Goal status: ongoing.    2.  Patient is independent with use of the anal dilators to open up the anus especially the band on the internal anal sphincter.  Baseline:  Goal status: met 03/27/2022   3.  Patient is independent with hip stretches to elongate the pelvic floor.  Baseline:  Goal status: Met 04/01/2022     LONG TERM GOALS: Target date: 05/11/2022    Patient is independent with HEP for flexibility and coordination of the pelvic floor.  Baseline:  Goal status: ongoing 04/10/2022   2.  Patient is able to push the therapist finger out of the anal canal with correct breath and relaxation of the pelvic floor.  Baseline:  Goal status: ongoing 04/10/2022   3.  Patient stool diameter is the size of nickel or bigger due to elongation of the anal canal.  Baseline:  Goal status: ongoing 04/10/2022   4.  Patient reports his straining with bowel movement has decreased >/= 75% due to elongation of the pelvic floor.  Baseline:  Goal status: INITIAL       PLAN: PT FREQUENCY: 1x/week   PT DURATION: 12 weeks   PLANNED INTERVENTIONS: Therapeutic exercises, Therapeutic activity, Neuromuscular re-education, Patient/Family education, Self Care, Joint mobilization, Dry Needling,  Cryotherapy, Moist heat, Taping, Biofeedback, and Manual therapy   PLAN FOR NEXT SESSION: work internally on the left side,  dry needling to pelvic floor, work on pushing therapist finger out of the rectum Earlie Counts, PT 04/22/22 8:51 AM

## 2022-04-29 ENCOUNTER — Encounter: Payer: Commercial Managed Care - PPO | Admitting: Physical Therapy

## 2022-05-06 ENCOUNTER — Ambulatory Visit: Payer: Commercial Managed Care - PPO | Attending: Gastroenterology | Admitting: Physical Therapy

## 2022-05-06 ENCOUNTER — Encounter: Payer: Self-pay | Admitting: Physical Therapy

## 2022-05-06 DIAGNOSIS — K5902 Outlet dysfunction constipation: Secondary | ICD-10-CM | POA: Diagnosis present

## 2022-05-06 DIAGNOSIS — R278 Other lack of coordination: Secondary | ICD-10-CM | POA: Diagnosis present

## 2022-05-06 DIAGNOSIS — M6281 Muscle weakness (generalized): Secondary | ICD-10-CM | POA: Diagnosis not present

## 2022-05-06 NOTE — Therapy (Signed)
OUTPATIENT PHYSICAL THERAPY TREATMENT NOTE   Patient Name: Jacob Graham MRN: [id number] DOB:1970-12-22, 51 y.o., male Today's Date: 05/06/2022  PCP: [facility] REFERRING PROVIDER: Mauri Pole, MD  END OF SESSION:   PT End of Session - 05/06/22 0800     Visit Number 8    Date for PT Re-Evaluation 07/06/22    Authorization Type [employer]    PT Start Time 0800    PT Stop Time 0840    PT Time Calculation (min) 40 min    Activity Tolerance Patient tolerated treatment well    Behavior During Therapy WFL for tasks assessed/performed             Past Medical History:  Diagnosis Date   Asthma    Diverticulitis    IBS (irritable bowel syndrome)    Rectal bleeding    Wears glasses    Past Surgical History:  Procedure Laterality Date   ANAL RECTAL MANOMETRY N/A 07/24/2020   Procedure: ANO RECTAL MANOMETRY;  Surgeon: Mauri Pole, MD;  Location: [facility];  Service: Endoscopy;  Laterality: N/A;   APPENDECTOMY  1999   colonoscopy  2008, 2014   [place], URETEROSCOPY AND STENT PLACEMENT Bilateral 10/25/2019   Procedure: CYSTOSCOPY WITH BILATERAL RETROGRADE, FIREFLY INJECTION  AND URETERAL CATHETER PLACEMENT;  Surgeon: Alexis Frock, MD;  Location: [facility];  Service: Urology;  Laterality: Bilateral;   EVALUATION UNDER ANESTHESIA WITH FISTULECTOMY N/A 10/09/2015   Procedure: EXAM UNDER ANESTHESIA; FISTULOTOMY;  Surgeon: Leighton Ruff, MD;  Location: [place];  Service: General;  Laterality: N/A;   [place]   Patient Active Problem List   Diagnosis Date Noted   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    Dyssynergic defecation    Diverticular disease 10/25/2019   Wears glasses    IBS (irritable bowel syndrome)    Rectal bleeding    Diverticulitis    Asthma    REFERRING DIAG:  K59.02 (ICD-10-CM) - Constipation, outlet dysfunction  K59.02 (ICD-10-CM) - Dyssynergic defecation      THERAPY DIAG:   Muscle weakness (generalized)   Other lack of coordination   Dyssynergic defecation   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   Rationale for Evaluation and Treatment Rehabilitation   ONSET DATE: 01/03/2022   SUBJECTIVE:                                                                                                                                                                                            SUBJECTIVE STATEMENT: I am on the second size dilator.    PAIN:  Are you having  pain? No     PRECAUTIONS: None   WEIGHT BEARING RESTRICTIONS No   FALLS:  Has patient fallen in last 6 months? No   LIVING ENVIRONMENT: Lives with: lives with their spouse     OCCUPATION: full time; walking daily, weights 3 times per week, stretch daily   PLOF: Independent   PATIENT GOALS reduce constipation and understand to use the tools   PERTINENT HISTORY:  IBS; Appendectomy; Fistulectomy     BOWEL MOVEMENT Pain with bowel movement: No Type of bowel movement:Type (Bristol Stool Scale) Type 4 that is long ribbon stool, Frequency daily but small amount, and Strain Yes Fully empty rectum: No Leakage: No Pads: No Fiber supplement: Yes: metamucil but was making it trouble to have a bowel movement   URINATION Pain with urination: No Fully empty bladder: Yes:   Stream: Strong Urgency: No Frequency: getting to urinate has difficulty     OBJECTIVE:    DIAGNOSTIC FINDINGS:  none     COGNITION:            Overall cognitive status: Within functional limits for tasks assessed                          SENSATION:            Light touch: Appears intact            Proprioception: Appears intact                     POSTURE: No Significant postural limitations               PELVIC ALIGNMENT:   LUMBARAROM/PROM full lumbar ROM     LOWER EXTREMITY AROM/PROM:    A/PROM Right eval Left eval Left Right 05/06/2022 Left 05/06/2022  Hip flexion Pain at endrange Pain at endrange  Full no pain Full no pain Full no pain  Hip internal rotation _0 Hip external rotation   45 80 65 65   (Blank rows = not tested)   LOWER EXTREMITY MMT:   MMT Right eval Left eval Right 05/06/2022 Left 05/06/2022  Hip extension 4/5 4/5 4/5 4/5  Hip abduction 4/5 4/5 5/5 5/5    PALPATION: GENERAL not able to push the therapist finger out of the anal canal               External Perineal Exam tenderness located  in the ischiocavernosus               Internal Pelvic Floor tenderness located on the left obturator internist, internal sphincter that is a tight band, iliococcygeus, anococcygeal ligament Patient confirms identification and approves PT to assess internal pelvic floor and treatment Yes   PELVIC MMT:   MMT eval 05/06/2022  Internal Anal Sphincter 4/5 4/5  External Anal Sphincter 4/5 4/5  Puborectalis 4/5 4/5  (Blank rows = not tested)     TONE: increased   TODAY'S TREATMENT  05/06/2022 Manual: Soft tissue mobilization: to assess for dry needling externally to the ischiocavernosus, superior transverse, puborectalis Internal pelvic floor techniques:No emotional/communication barriers or cognitive limitation. Patient is motivated to learn. Patient understands and agrees with treatment goals and plan. PT explains patient will be examined in standing, sitting, and lying down to see how their muscles and joints work. When they are ready, they will be asked to remove their underwear so PT can examine their perineum. The patient is  also given the option of providing their own chaperone as one is not provided in our facility. The patient also has the right and is explained the right to defer or refuse any part of the evaluation or treatment including the internal exam. With the patient's consent, PT will use one gloved finger to gently assess the muscles of the pelvic floor, seeing how well it contracts and relaxes and if there is muscle symmetry. After, the patient will  get dressed and PT and patient will discuss exam findings and plan of care. PT and patient discuss plan of care, schedule, attendance policy and HEP activities. Therapist index finger going into the rectum to perform manual work to the internal anal sphincter and along the anterior wall of the rectum to elongate tissue Trigger Point Dry-Needling  Treatment instructions: Expect mild to moderate muscle soreness. S/S of pneumothorax if dry needled over a lung field, and to seek immediate medical attention should they occur. Patient verbalized understanding of these instructions and education.  Patient Consent Given: Yes Education handout provided: Previously provided Muscles treated: ischiocavernosus, superior transverse, puborectalis Electrical stimulation performed: No Parameters: N/A Treatment response/outcome: elongation of muscles and trigger point response  Neuromuscular re-education: Pelvic floor contraction training: diaphragmatic breathing to push the therapist finger out of the anal canal but had difficulty doing this. Suggested to patient to try blowing in a balloon to increase abdominal pressure    04/22/2022 Manual: Soft tissue mobilization:to assess for dry needling, manual work to the ischiocavernosus, superior transverse perineum, and perineal body to elongate after dry needling Internal pelvic floor techniques:Manual  work to the puborectalis anteriorly and the anterior sphincter muscles going through the fascial layers Trigger Point Dry-Needling  Treatment instructions: Expect mild to moderate muscle soreness. S/S of pneumothorax if dry needled over a lung field, and to seek immediate medical attention should they occur. Patient verbalized understanding of these instructions and education.   Patient Consent Given: Yes Education handout provided: Previously provided Muscles treated:  right ischiocavernosus, perineal body, puborectalis, superior transverse perineum Electrical  stimulation performed: No Parameters: N/A Treatment response/outcome:elongation of muscle and trigger point response Neuromuscular re-education: Form correction:siting with ball between knees and move forward and back with gluteal contraction 15x Exercises: Stretches/mobility:hip stretches with foot on the bed to open up the left anterior hip.  Self-care: Educated patient on ways to manage constipation, eat at regular meals, drink water, use garbage can for squatty potty and massage the anus    04/15/2022 Manual: Soft tissue mobilization: to assess for dry needling to the left pelvic floor muscles; release of the left ischiocavernosus and perineal body to elongate after dry needling.  Internal pelvic floor techniques:No emotional/communication barriers or cognitive limitation. Patient is motivated to learn. Patient understands and agrees with treatment goals and plan. PT explains patient will be examined in standing, sitting, and lying down to see how their muscles and joints work. When they are ready, they will be asked to remove their underwear so PT can examine their perineum. The patient is also given the option of providing their own chaperone as one is not provided in our facility. The patient also has the right and is explained the right to defer or refuse any part of the evaluation or treatment including the internal exam. With the patient's consent, PT will use one gloved finger to gently assess the muscles of the pelvic floor, seeing how well it contracts and relaxes and if there is muscle symmetry. After, the patient will get dressed  and PT and patient will discuss exam findings and plan of care. PT and patient discuss plan of care, schedule, attendance policy and HEP activities.  Going through the anus to work on the left side and anterior sphincter muscles with fascial release and manual work to elongate the muscles.  Trigger Point Dry-Needling  Treatment instructions: Expect mild to  moderate muscle soreness. S/S of pneumothorax if dry needled over a lung field, and to seek immediate medical attention should they occur. Patient verbalized understanding of these instructions and education.   Patient Consent Given: Yes Education handout provided: Previously provided Muscles treated: left ischiocavernosus, perineal body, superior transverse perineum, puborectalis external anal sphincter Electrical stimulation performed: No Parameters: N/A Treatment response/outcome: elongation    Self-care: Reviewed on how to massage the perineal with the ball or foam roll Educated patient on how to move up to the next size dilator Educated patient on how to use the dilator on the commode to push it out to educate the muscles to relax     PATIENT EDUCATION:  [id number] Education details: Access Code: [place], educated patient on how to move up to the next dilator, educated patient on how to use the dilator to push it out to facilitate a bowel movement Person educated: Patient Education method: Explanation, Demonstration, Tactile cues, and Verbal cues Education comprehension: verbalized understanding, returned demonstration, verbal cues required, tactile cues required, and needs further education     HOME EXERCISE PROGRAM: 03/11/2022 Access Code: [email or web address] URL: [email or web address] Date: 03/11/2022 Prepared by: Earlie Counts   Exercises -- Sidelying IT Band Foam Roll Mobilization  - 1 x daily - 7 x weekly - 3 sets - 10 reps - Hip Flexor Mobilization with Foam Roll  - 1 x daily - 7 x weekly - 3 sets - 10 reps - Piriformis Mobilization on Foam Roll  - 1 x daily - 7 x weekly - 3 sets - 10 reps - Adductor Mobilization with Foam Roll  - 1 x daily - 7 x weekly - 3 sets - 10 reps -  ASSESSMENT:   CLINICAL IMPRESSION: Patient is a 51 y.o. male  who was seen today for physical therapy treatment for constipation and Dyssynergic defecation. Stool shape is is now an index finger  size.   Patient still strains to have a bowel movment. Patient is getting more awareness of the rectum. Patient had difficulty with pushing therapist finger out of the rectum. Therapist was able to place her index finger into the rectum with greater ease due to the increased diameter of the rectum. He is using the second dilator to expand the tissue. Patient is doing well with his HEP. Patient will benefit from skilled therapy to improve constipation, elongation of the pelvic floor and improve the ability to push stool out.      OBJECTIVE IMPAIRMENTS decreased activity tolerance, decreased coordination, decreased endurance, decreased ROM, decreased strength, increased fascial restrictions, increased muscle spasms, and impaired tone.    ACTIVITY LIMITATIONS toileting   PARTICIPATION LIMITATIONS:  none   PERSONAL FACTORS Time since onset of injury/illness/exacerbation are also affecting patient's functional outcome.    REHAB POTENTIAL: Excellent   CLINICAL DECISION MAKING: Stable/uncomplicated   EVALUATION COMPLEXITY: Low     GOALS: Goals reviewed with patient? Yes   SHORT TERM GOALS: Target date: 03/16/2022    Patient understands how to use the anal wand to reduce trigger points in the pelvic floor.  Baseline: Goal status: ongoing.    2.  Patient is independent with  use of the anal dilators to open up the anus especially the band on the internal anal sphincter.  Baseline:  Goal status: met 03/27/2022   3.  Patient is independent with hip stretches to elongate the pelvic floor.  Baseline:  Goal status: Met 04/01/2022     LONG TERM GOALS: Target date: 05/11/2022    Patient is independent with HEP for flexibility and coordination of the pelvic floor.  Baseline:  Goal status: ongoing 05/06/2022  2.  Patient is able to push the therapist finger out of the anal canal with correct breath and relaxation of the pelvic floor.  Baseline:  Goal status: ongoing 05/06/2022   3.  Patient stool  diameter is the size of nickel or bigger due to elongation of the anal canal.  Baseline:  Goal status: ongoing 05/06/2022   4.  Patient reports his straining with bowel movement has decreased >/= 75% due to elongation of the pelvic floor.  Baseline:  Goal status: 05/06/2022      PLAN: PT FREQUENCY: 1x/week   PT DURATION: 12 weeks   PLANNED INTERVENTIONS: Therapeutic exercises, Therapeutic activity, Neuromuscular re-education, Patient/Family education, Self Care, Joint mobilization, Dry Needling, Cryotherapy, Moist heat, Taping, Biofeedback, and Manual therapy   PLAN FOR NEXT SESSION: work internally on the right side,  dry needling to pelvic floor on right side,  work on pushing therapist finger out of the rectum  Earlie Counts, PT 05/06/22 8:54 AM

## 2022-05-09 IMAGING — CT CT ABD-PELV W/ CM
2 of 5 series · 17 of 46 positions shown, 19 images · IV contrast (OMNIPAQUE 300)
Comparison: 06/20/2019.

CLINICAL DATA: Constipation.

EXAM:
CT ABDOMEN AND PELVIS WITH CONTRAST
TECHNIQUE: Multidetector CT imaging of the abdomen and pelvis was performed
using the standard protocol following bolus administration of
intravenous contrast.
CONTRAST:  100mL OMNIPAQUE IOHEXOL 300 MG/ML  SOLN

[Series 2: axial st · axial · 0.85mm/px · z∈[+564,+994]mm · 14 of 100 slices shown, 16 images]
[im 7/100  soft-tissue]
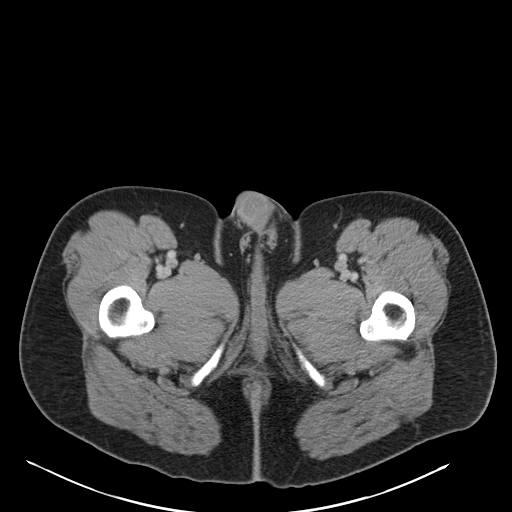
[im 7/100  bone]
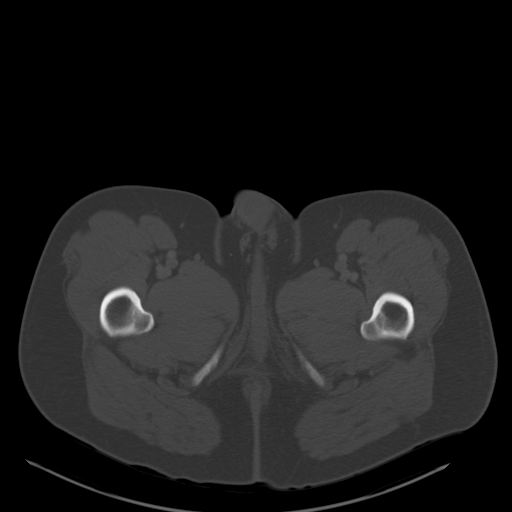
[im 14/100  soft-tissue]
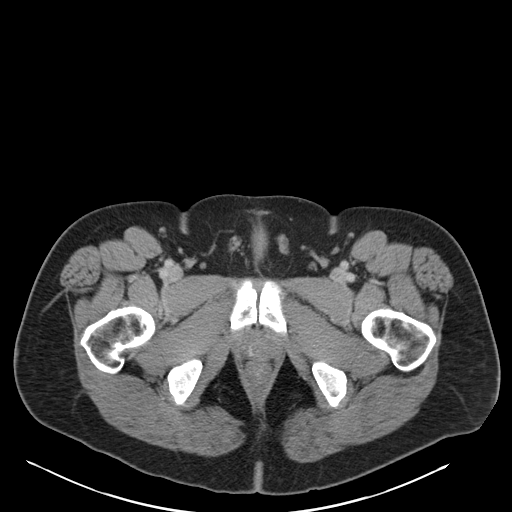
[im 20/100  soft-tissue]
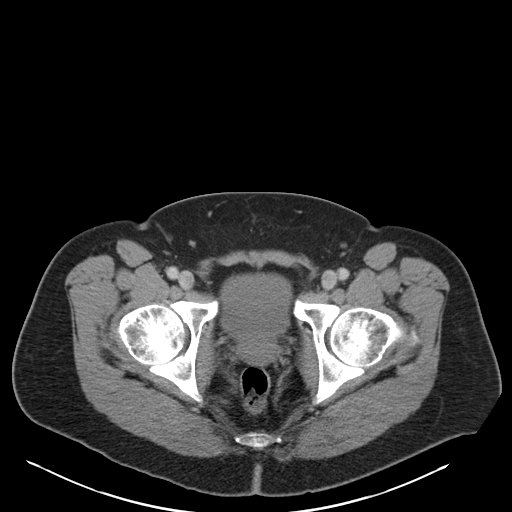
[im 27/100  soft-tissue]
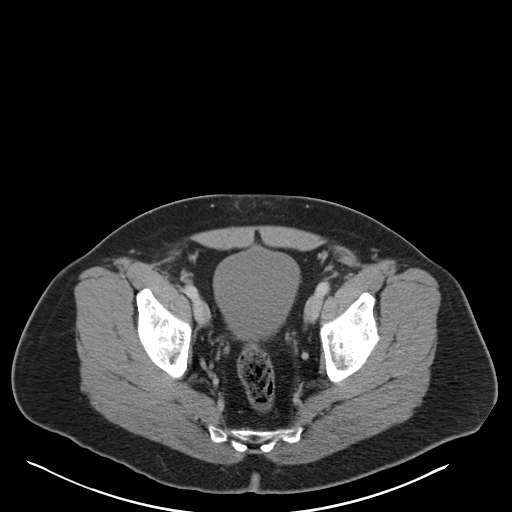
[im 34/100  soft-tissue]
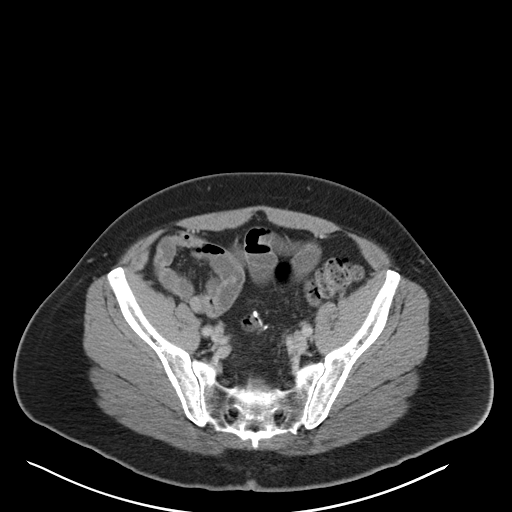
[im 40/100  soft-tissue]
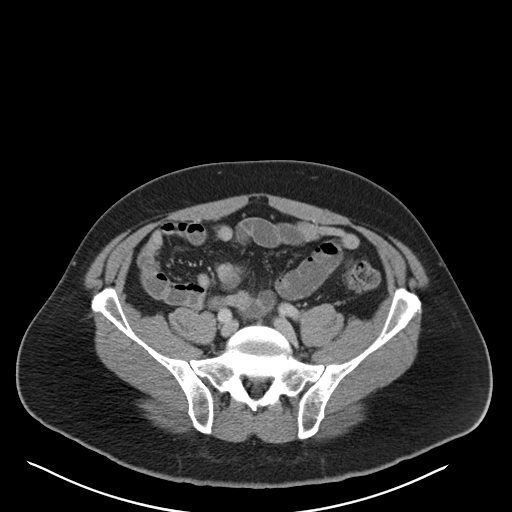
[im 47/100  soft-tissue]
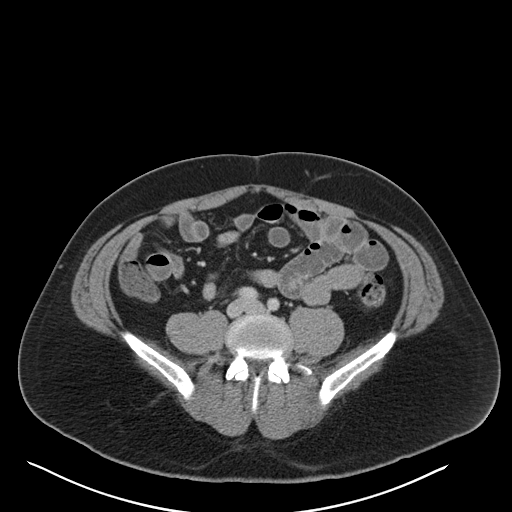
[im 53/100  soft-tissue]
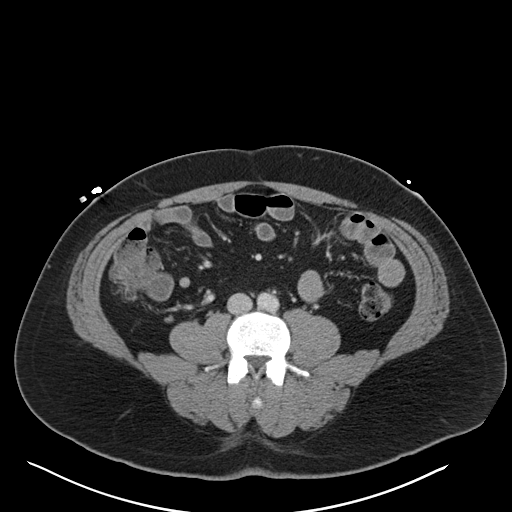
[im 60/100  soft-tissue]
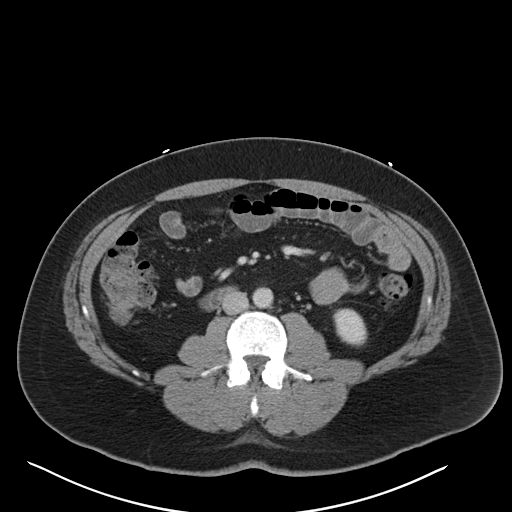
[im 60/100  bone]
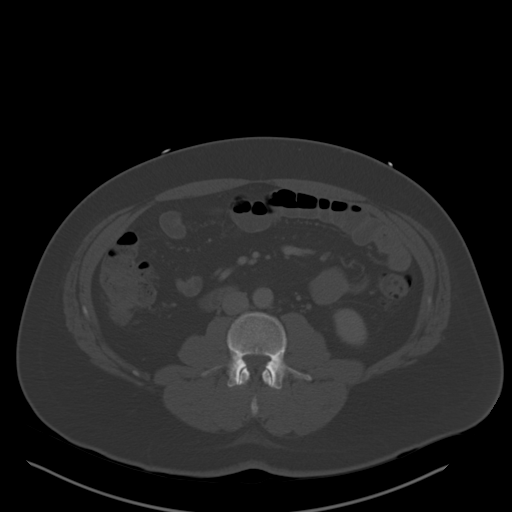
[im 67/100  soft-tissue]
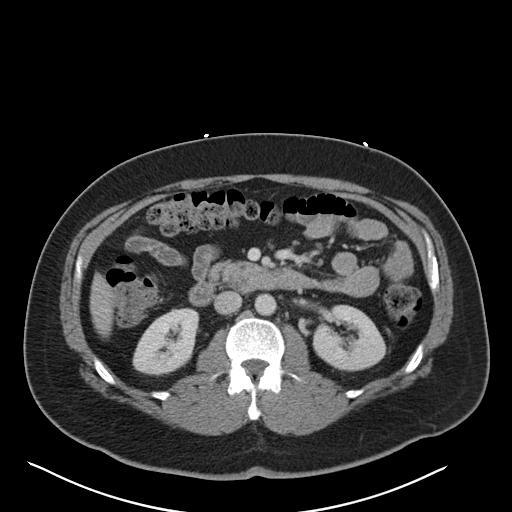
[im 73/100  soft-tissue]
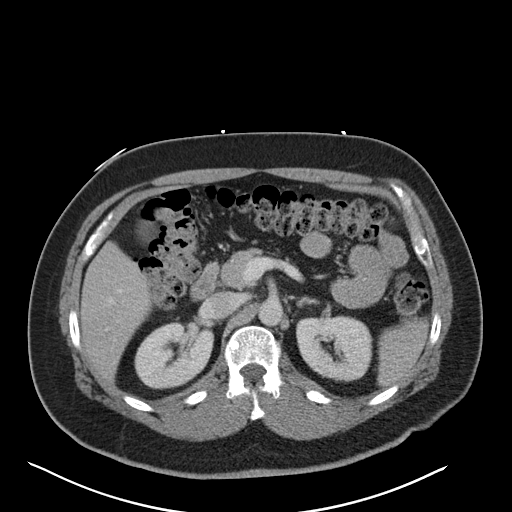
[im 80/100  soft-tissue]
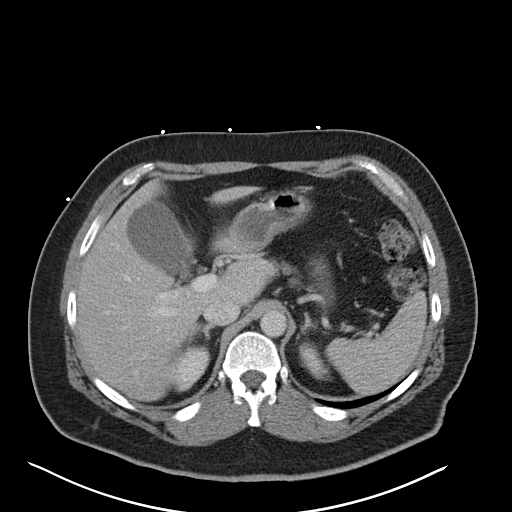
[im 86/100  soft-tissue]
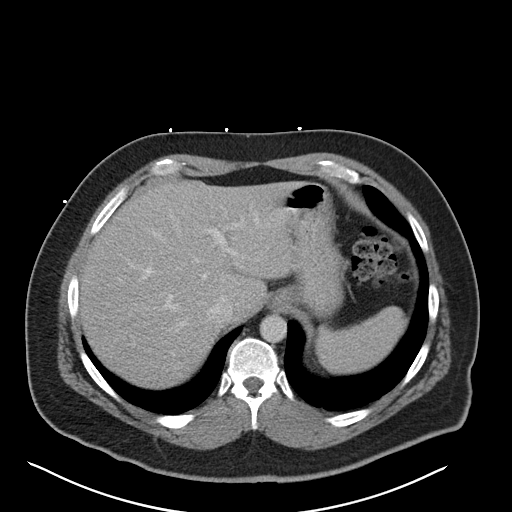
[im 93/100  soft-tissue]
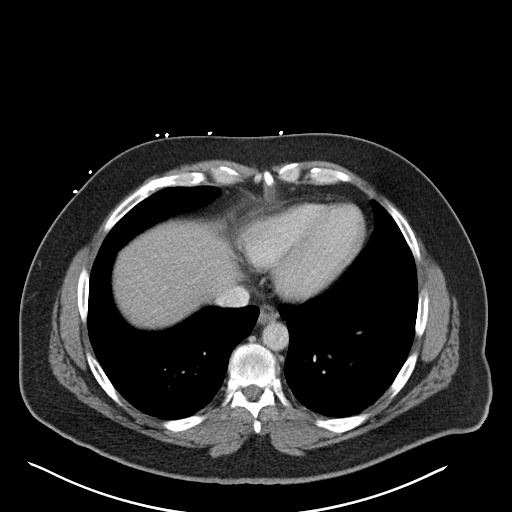

[Series 4: coronal st · coronal · 0.97mm/px · 3 of 124 slices shown]
[im 42/124  soft-tissue]
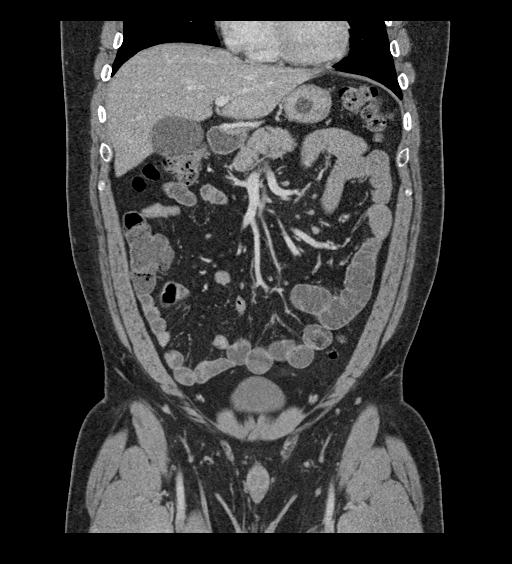
[im 55/124  soft-tissue]
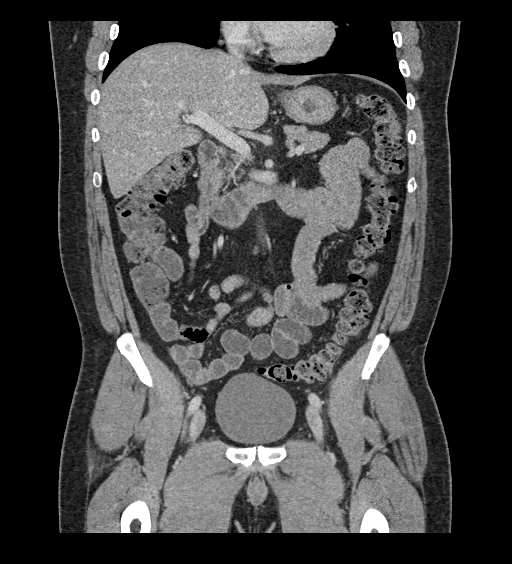
[im 69/124  soft-tissue]
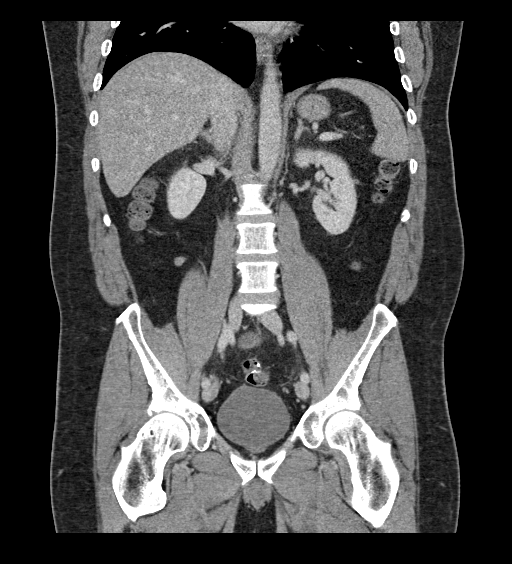

[17 of 46 positions shown; findings below may reference images not displayed]

FINDINGS: Lower chest: Lung bases are clear. Heart size normal. No pericardial
or pleural effusion. Distal esophagus is unremarkable.

Hepatobiliary: Liver may be slightly decreased in attenuation
diffusely. Liver and gallbladder are otherwise unremarkable. No
biliary ductal dilatation.

Pancreas: Negative.

Spleen: Negative.

Adrenals/Urinary Tract: Adrenal glands and kidneys are unremarkable.
Ureters are decompressed. Bladder is grossly unremarkable.

Stomach/Bowel: Stomach and small bowel are unremarkable. Appendix is
not readily visualized. Stool is seen in the majority of the colon.
Sigmoid colon resection.

Vascular/Lymphatic: Atherosclerotic calcification of the aorta. No
pathologically enlarged lymph nodes.

Reproductive: Prostate is visualized.

Other: No free fluid.  Mesenteries and peritoneum are unremarkable.

Musculoskeletal: No worrisome lytic or sclerotic lesions. Bone
island in the L1 vertebral body.
IMPRESSION: 1. Stool throughout the colon is indicative of constipation.
2. Liver may be steatotic.
3.  Aortic atherosclerosis ([id number]).

## 2022-05-13 ENCOUNTER — Ambulatory Visit: Payer: Commercial Managed Care - PPO | Admitting: Physical Therapy

## 2022-05-13 ENCOUNTER — Encounter: Payer: Self-pay | Admitting: Physical Therapy

## 2022-05-13 DIAGNOSIS — K5902 Outlet dysfunction constipation: Secondary | ICD-10-CM

## 2022-05-13 DIAGNOSIS — M6281 Muscle weakness (generalized): Secondary | ICD-10-CM | POA: Diagnosis not present

## 2022-05-13 DIAGNOSIS — R278 Other lack of coordination: Secondary | ICD-10-CM

## 2022-05-13 NOTE — Therapy (Signed)
OUTPATIENT PHYSICAL THERAPY TREATMENT NOTE   Patient Name: Jacob Graham MRN: [id number] DOB:02/08/1971, 51 y.o., male Today's Date: 05/13/2022  PCP: [facility]  REFERRING PROVIDER: Mauri Pole, MD   END OF SESSION:   PT End of Session - 05/13/22 0803     Visit Number 9    Date for PT Re-Evaluation 07/06/22    Authorization Type [employer]    PT Start Time 0800    PT Stop Time 0840    PT Time Calculation (min) 40 min    Activity Tolerance Patient tolerated treatment well    Behavior During Therapy WFL for tasks assessed/performed             Past Medical History:  Diagnosis Date   Asthma    Diverticulitis    IBS (irritable bowel syndrome)    Rectal bleeding    Wears glasses    Past Surgical History:  Procedure Laterality Date   ANAL RECTAL MANOMETRY N/A 07/24/2020   Procedure: ANO RECTAL MANOMETRY;  Surgeon: Mauri Pole, MD;  Location: [facility];  Service: Endoscopy;  Laterality: N/A;   APPENDECTOMY  1999   colonoscopy  2008, 2014   [place], URETEROSCOPY AND STENT PLACEMENT Bilateral 10/25/2019   Procedure: CYSTOSCOPY WITH BILATERAL RETROGRADE, FIREFLY INJECTION  AND URETERAL CATHETER PLACEMENT;  Surgeon: Alexis Frock, MD;  Location: [facility];  Service: Urology;  Laterality: Bilateral;   EVALUATION UNDER ANESTHESIA WITH FISTULECTOMY N/A 10/09/2015   Procedure: EXAM UNDER ANESTHESIA; FISTULOTOMY;  Surgeon: Leighton Ruff, MD;  Location: [place];  Service: General;  Laterality: N/A;   [place]   Patient Active Problem List   Diagnosis Date Noted   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    Dyssynergic defecation    Diverticular disease 10/25/2019   Wears glasses    IBS (irritable bowel syndrome)    Rectal bleeding    Diverticulitis    Asthma    REFERRING DIAG:  K59.02 (ICD-10-CM) - Constipation, outlet dysfunction  K59.02 (ICD-10-CM) - Dyssynergic defecation      THERAPY  DIAG:  Muscle weakness (generalized)   Other lack of coordination   Dyssynergic defecation   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   Rationale for Evaluation and Treatment Rehabilitation   ONSET DATE: 01/03/2022   SUBJECTIVE:                                                                                                                                                                                            SUBJECTIVE STATEMENT: I am on the third size dilator. I have more stretching. I have  more volume of stool. I am  not using the Miralx last week.    PAIN:  Are you having pain? No     PRECAUTIONS: None   WEIGHT BEARING RESTRICTIONS No   FALLS:  Has patient fallen in last 6 months? No   LIVING ENVIRONMENT: Lives with: lives with their spouse     OCCUPATION: full time; walking daily, weights 3 times per week, stretch daily   PLOF: Independent   PATIENT GOALS reduce constipation and understand to use the tools   PERTINENT HISTORY:  IBS; Appendectomy; Fistulectomy     BOWEL MOVEMENT Pain with bowel movement: No Type of bowel movement:Type (Bristol Stool Scale) Type 4 that is long ribbon stool, Frequency daily but small amount, and Strain Yes Fully empty rectum: No Leakage: No Pads: No Fiber supplement: Yes: metamucil but was making it trouble to have a bowel movement   URINATION Pain with urination: No Fully empty bladder: Yes:   Stream: Strong Urgency: No Frequency: getting to urinate has difficulty     OBJECTIVE:    DIAGNOSTIC FINDINGS:  none     COGNITION:            Overall cognitive status: Within functional limits for tasks assessed                          SENSATION:            Light touch: Appears intact            Proprioception: Appears intact                     POSTURE: No Significant postural limitations               PELVIC ALIGNMENT:   LUMBARAROM/PROM full lumbar ROM     LOWER EXTREMITY AROM/PROM:    A/PROM Right eval  Left eval Left Right 05/06/2022 Left 05/06/2022  Hip flexion Pain at endrange Pain at endrange Full no pain Full no pain Full no pain  Hip internal rotation _0 Hip external rotation   45 80 65 65   (Blank rows = not tested)   LOWER EXTREMITY MMT:   MMT Right eval Left eval Right 05/06/2022 Left 05/06/2022  Hip extension 4/5 4/5 4/5 4/5  Hip abduction 4/5 4/5 5/5 5/5    PALPATION: GENERAL not able to push the therapist finger out of the anal canal               External Perineal Exam tenderness located  in the ischiocavernosus               Internal Pelvic Floor tenderness located on the left obturator internist, internal sphincter that is a tight band, iliococcygeus, anococcygeal ligament Patient confirms identification and approves PT to assess internal pelvic floor and treatment Yes   PELVIC MMT:   MMT eval 05/06/2022  Internal Anal Sphincter 4/5 4/5  External Anal Sphincter 4/5 4/5  Puborectalis 4/5 4/5  (Blank rows = not tested)     TONE: increased   TODAY'S TREATMENT  05/13/2022 Manual: Soft tissue mobilization:To assess for dry needling Manual work to the right ischiocavernosus, superior transverse perineum, coccygeus, iliococcygeus Internal pelvic floor techniques:No emotional/communication barriers or cognitive limitation. Patient is motivated to learn. Patient understands and agrees with treatment goals and plan. PT explains patient will be examined in standing, sitting, and lying down to see how their muscles  and joints work. When they are ready, they will be asked to remove their underwear so PT can examine their perineum. The patient is also given the option of providing their own chaperone as one is not provided in our facility. The patient also has the right and is explained the right to defer or refuse any part of the evaluation or treatment including the internal exam. With the patient's consent, PT will use one gloved finger to gently assess the  muscles of the pelvic floor, seeing how well it contracts and relaxes and if there is muscle symmetry. After, the patient will get dressed and PT and patient will discuss exam findings and plan of care. PT and patient discuss plan of care, schedule, attendance policy and HEP activities.  Going through the anus working on the puborectalis, internal anal sphincter, levator ani to elongate in right sidely Dry needling:Trigger Point Dry-Needling  Treatment instructions: Expect mild to moderate muscle soreness. S/S of pneumothorax if dry needled over a lung field, and to seek immediate medical attention should they occur. Patient verbalized understanding of these instructions and education.  Patient Consent Given: Yes Education handout provided: Previously provided Muscles treated: right coccygeus, puborectalis, ischiocavernous, superior transverse perineum Electrical stimulation performed: No Parameters: N/A Treatment response/outcome: elongation of muscle and trigger point response.  Exercises: Stretches/mobility:cat cow 15x Windshield wiper stretch for hips 5x    05/06/2022 Manual: Soft tissue mobilization: to assess for dry needling externally to the ischiocavernosus, superior transverse, puborectalis Internal pelvic floor techniques:No emotional/communication barriers or cognitive limitation. Patient is motivated to learn. Patient understands and agrees with treatment goals and plan. PT explains patient will be examined in standing, sitting, and lying down to see how their muscles and joints work. When they are ready, they will be asked to remove their underwear so PT can examine their perineum. The patient is also given the option of providing their own chaperone as one is not provided in our facility. The patient also has the right and is explained the right to defer or refuse any part of the evaluation or treatment including the internal exam. With the patient's consent, PT will use one gloved  finger to gently assess the muscles of the pelvic floor, seeing how well it contracts and relaxes and if there is muscle symmetry. After, the patient will get dressed and PT and patient will discuss exam findings and plan of care. PT and patient discuss plan of care, schedule, attendance policy and HEP activities. Therapist index finger going into the rectum to perform manual work to the internal anal sphincter and along the anterior wall of the rectum to elongate tissue Trigger Point Dry-Needling  Treatment instructions: Expect mild to moderate muscle soreness. S/S of pneumothorax if dry needled over a lung field, and to seek immediate medical attention should they occur. Patient verbalized understanding of these instructions and education.   Patient Consent Given: Yes Education handout provided: Previously provided Muscles treated: ischiocavernosus, superior transverse, puborectalis Electrical stimulation performed: No Parameters: N/A Treatment response/outcome: elongation of muscles and trigger point response   Neuromuscular re-education: Pelvic floor contraction training: diaphragmatic breathing to push the therapist finger out of the anal canal but had difficulty doing this. Suggested to patient to try blowing in a balloon to increase abdominal pressure    04/22/2022 Manual: Soft tissue mobilization:to assess for dry needling, manual work to the ischiocavernosus, superior transverse perineum, and perineal body to elongate after dry needling Internal pelvic floor techniques:Manual  work to the puborectalis anteriorly  and the anterior sphincter muscles going through the fascial layers Trigger Point Dry-Needling  Treatment instructions: Expect mild to moderate muscle soreness. S/S of pneumothorax if dry needled over a lung field, and to seek immediate medical attention should they occur. Patient verbalized understanding of these instructions and education.   Patient Consent Given:  Yes Education handout provided: Previously provided Muscles treated:  right ischiocavernosus, perineal body, puborectalis, superior transverse perineum Electrical stimulation performed: No Parameters: N/A Treatment response/outcome:elongation of muscle and trigger point response Neuromuscular re-education: Form correction:siting with ball between knees and move forward and back with gluteal contraction 15x Exercises: Stretches/mobility:hip stretches with foot on the bed to open up the left anterior hip.  Self-care: Educated patient on ways to manage constipation, eat at regular meals, drink water, use garbage can for squatty potty and massage the anus      PATIENT EDUCATION:  [id number] Education details: Access Code: [email or web address], educated patient on how to move up to the next dilator, educated patient on how to use the dilator to push it out to facilitate a bowel movement Person educated: Patient Education method: Explanation, Demonstration, Tactile cues, and Verbal cues Education comprehension: verbalized understanding, returned demonstration, verbal cues required, tactile cues required, and needs further education     HOME EXERCISE PROGRAM: 03/11/2022 Access Code: [email or web address] URL: [email or web address] Date: 03/11/2022 Prepared by: Earlie Counts   Exercises -- Sidelying IT Band Foam Roll Mobilization  - 1 x daily - 7 x weekly - 3 sets - 10 reps - Hip Flexor Mobilization with Foam Roll  - 1 x daily - 7 x weekly - 3 sets - 10 reps - Piriformis Mobilization on Foam Roll  - 1 x daily - 7 x weekly - 3 sets - 10 reps - Adductor Mobilization with Foam Roll  - 1 x daily - 7 x weekly - 3 sets - 10 reps -  ASSESSMENT:   CLINICAL IMPRESSION: Patient is a 51 y.o. male  who was seen today for physical therapy treatment for constipation and Dyssynergic defecation. Patient is having increased volume of stool and reduce miralx. Patient is starting to have the strong urge to have a bowel  movements.  Patient is on the third dilator. He continues to have tightness in the puborectalis and internal anal sphincter. Patient will benefit from skilled therapy to improve constipation, elongation of the pelvic floor and improve the ability to push stool out.      OBJECTIVE IMPAIRMENTS decreased activity tolerance, decreased coordination, decreased endurance, decreased ROM, decreased strength, increased fascial restrictions, increased muscle spasms, and impaired tone.    ACTIVITY LIMITATIONS toileting   PARTICIPATION LIMITATIONS:  none   PERSONAL FACTORS Time since onset of injury/illness/exacerbation are also affecting patient's functional outcome.    REHAB POTENTIAL: Excellent   CLINICAL DECISION MAKING: Stable/uncomplicated   EVALUATION COMPLEXITY: Low     GOALS: Goals reviewed with patient? Yes   SHORT TERM GOALS: Target date: 03/16/2022    Patient understands how to use the anal wand to reduce trigger points in the pelvic floor.  Baseline: Goal status: ongoing.    2.  Patient is independent with use of the anal dilators to open up the anus especially the band on the internal anal sphincter.  Baseline:  Goal status: met 03/27/2022   3.  Patient is independent with hip stretches to elongate the pelvic floor.  Baseline:  Goal status: Met 04/01/2022     LONG TERM GOALS: Target date: 05/11/2022    Patient is independent with HEP  for flexibility and coordination of the pelvic floor.  Baseline:  Goal status: ongoing 05/06/2022  2.  Patient is able to push the therapist finger out of the anal canal with correct breath and relaxation of the pelvic floor.  Baseline:  Goal status: ongoing 05/06/2022   3.  Patient stool diameter is the size of nickel or bigger due to elongation of the anal canal.  Baseline:  Goal status: ongoing 05/06/2022   4.  Patient reports his straining with bowel movement has decreased >/= 75% due to elongation of the pelvic floor.  Baseline:  Goal  status: 05/06/2022      PLAN: PT FREQUENCY: 1x/week   PT DURATION: 12 weeks   PLANNED INTERVENTIONS: Therapeutic exercises, Therapeutic activity, Neuromuscular re-education, Patient/Family education, Self Care, Joint mobilization, Dry Needling, Cryotherapy, Moist heat, Taping, Biofeedback, and Manual therapy   PLAN FOR NEXT SESSION: work internally on the left side,  dry needling to pelvic floor on right side,  work on pushing therapist finger out of the rectum, mobilization of right hip with strap in standing  Earlie Counts, PT 05/13/22 8:48 AM

## 2022-05-18 ENCOUNTER — Ambulatory Visit: Payer: Commercial Managed Care - PPO | Admitting: Physical Therapy

## 2022-05-18 ENCOUNTER — Encounter: Payer: Self-pay | Admitting: Physical Therapy

## 2022-05-18 DIAGNOSIS — R278 Other lack of coordination: Secondary | ICD-10-CM

## 2022-05-18 DIAGNOSIS — K5902 Outlet dysfunction constipation: Secondary | ICD-10-CM

## 2022-05-18 DIAGNOSIS — M6281 Muscle weakness (generalized): Secondary | ICD-10-CM | POA: Diagnosis not present

## 2022-05-18 NOTE — Therapy (Signed)
OUTPATIENT PHYSICAL THERAPY TREATMENT NOTE   Patient Name: Jacob Graham MRN: [id number] DOB:1971-04-04, 51 y.o., male Today's Date: 05/18/2022  PCP:  [facility] REFERRING PROVIDER: Mauri Pole, MD      END OF SESSION:   PT End of Session - 05/18/22 1528     Visit Number 10    Date for PT Re-Evaluation 07/06/22    Authorization Type [employer]    PT Start Time 7711    PT Stop Time 1525    PT Time Calculation (min) 40 min    Activity Tolerance Patient tolerated treatment well    Behavior During Therapy WFL for tasks assessed/performed             Past Medical History:  Diagnosis Date   Asthma    Diverticulitis    IBS (irritable bowel syndrome)    Rectal bleeding    Wears glasses    Past Surgical History:  Procedure Laterality Date   ANAL RECTAL MANOMETRY N/A 07/24/2020   Procedure: ANO RECTAL MANOMETRY;  Surgeon: Mauri Pole, MD;  Location: [facility];  Service: Endoscopy;  Laterality: N/A;   APPENDECTOMY  1999   colonoscopy  2008, 2014   [place], URETEROSCOPY AND STENT PLACEMENT Bilateral 10/25/2019   Procedure: CYSTOSCOPY WITH BILATERAL RETROGRADE, FIREFLY INJECTION  AND URETERAL CATHETER PLACEMENT;  Surgeon: Alexis Frock, MD;  Location: [facility];  Service: Urology;  Laterality: Bilateral;   EVALUATION UNDER ANESTHESIA WITH FISTULECTOMY N/A 10/09/2015   Procedure: EXAM UNDER ANESTHESIA; FISTULOTOMY;  Surgeon: Leighton Ruff, MD;  Location: [place];  Service: General;  Laterality: N/A;   [place]   Patient Active Problem List   Diagnosis Date Noted   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    Dyssynergic defecation    Diverticular disease 10/25/2019   Wears glasses    IBS (irritable bowel syndrome)    Rectal bleeding    Diverticulitis    Asthma    REFERRING DIAG:  K59.02 (ICD-10-CM) - Constipation, outlet dysfunction  K59.02 (ICD-10-CM) - Dyssynergic defecation      THERAPY  DIAG:  Muscle weakness (generalized)   Other lack of coordination   Dyssynergic defecation   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   Rationale for Evaluation and Treatment Rehabilitation   ONSET DATE: 01/03/2022   SUBJECTIVE:                                                                                                                                                                                            SUBJECTIVE STATEMENT: I am feeling good and sore. My bowel movements are  longer. Thickness is the index finger. And diamond shape.     PAIN:  Are you having pain? No     PRECAUTIONS: None   WEIGHT BEARING RESTRICTIONS No   FALLS:  Has patient fallen in last 6 months? No   LIVING ENVIRONMENT: Lives with: lives with their spouse     OCCUPATION: full time; walking daily, weights 3 times per week, stretch daily   PLOF: Independent   PATIENT GOALS reduce constipation and understand to use the tools   PERTINENT HISTORY:  IBS; Appendectomy; Fistulectomy     BOWEL MOVEMENT Pain with bowel movement: No Type of bowel movement:Type (Bristol Stool Scale) Type 4 that is long ribbon stool, Frequency daily but small amount, and Strain Yes Fully empty rectum: No Leakage: No Pads: No Fiber supplement: Yes: metamucil but was making it trouble to have a bowel movement   URINATION Pain with urination: No Fully empty bladder: Yes:   Stream: Strong Urgency: No Frequency: getting to urinate has difficulty   OBJECTIVE: (objective measures completed at initial evaluation unless otherwise dated)   (Copy Eval's Objective through Plan section here)    DIAGNOSTIC FINDINGS:  none     COGNITION:            Overall cognitive status: Within functional limits for tasks assessed                          SENSATION:            Light touch: Appears intact            Proprioception: Appears intact                     POSTURE: No Significant postural limitations                PELVIC ALIGNMENT:   LUMBARAROM/PROM full lumbar ROM     LOWER EXTREMITY AROM/PROM:    A/PROM Right eval Left eval Left Right 05/06/2022 Left 05/06/2022  Hip flexion Pain at endrange Pain at endrange Full no pain Full no pain Full no pain  Hip internal rotation _0 Hip external rotation   45 80 65 65   (Blank rows = not tested)   LOWER EXTREMITY MMT:   MMT Right eval Left eval Right 05/06/2022 Left 05/06/2022  Hip extension 4/5 4/5 4/5 4/5  Hip abduction 4/5 4/5 5/5 5/5    PALPATION: GENERAL not able to push the therapist finger out of the anal canal               External Perineal Exam tenderness located  in the ischiocavernosus               Internal Pelvic Floor tenderness located on the left obturator internist, internal sphincter that is a tight band, iliococcygeus, anococcygeal ligament Patient confirms identification and approves PT to assess internal pelvic floor and treatment Yes   PELVIC MMT:   MMT eval 05/06/2022  Internal Anal Sphincter 4/5 4/5  External Anal Sphincter 4/5 4/5  Puborectalis 4/5 4/5  (Blank rows = not tested)     TONE: increased   TODAY'S TREATMENT  05/18/2022 Manual: Soft tissue mobilization: to assess for dry needling Manual work to the ischiocavernosus, superior transvers, puborectalis, iliococcygeus and external anal sphincter on the left to elongate after dry needling :Trigger Point Dry-Needling  Treatment instructions: Expect mild to moderate muscle soreness. S/S of pneumothorax if  dry needled over a lung field, and to seek immediate medical attention should they occur. Patient verbalized understanding of these instructions and education.  Patient Consent Given: Yes Education handout provided: Previously provided Muscles treated: left ischiocavernosus, puborectalis, iliococcygeus, superior transverse Electrical stimulation performed: No Parameters: N/A Treatment response/outcome: elongation of muscles and trigger  point response  Exercises: Stretches/mobility:hip mobilization to left hip with     05/13/2022 Manual: Soft tissue mobilization:To assess for dry needling Manual work to the right ischiocavernosus, superior transverse perineum, coccygeus, iliococcygeus Internal pelvic floor techniques:No emotional/communication barriers or cognitive limitation. Patient is motivated to learn. Patient understands and agrees with treatment goals and plan. PT explains patient will be examined in standing, sitting, and lying down to see how their muscles and joints work. When they are ready, they will be asked to remove their underwear so PT can examine their perineum. The patient is also given the option of providing their own chaperone as one is not provided in our facility. The patient also has the right and is explained the right to defer or refuse any part of the evaluation or treatment including the internal exam. With the patient's consent, PT will use one gloved finger to gently assess the muscles of the pelvic floor, seeing how well it contracts and relaxes and if there is muscle symmetry. After, the patient will get dressed and PT and patient will discuss exam findings and plan of care. PT and patient discuss plan of care, schedule, attendance policy and HEP activities.  Going through the anus working on the puborectalis, internal anal sphincter, levator ani to elongate in right sidely Dry needling:Trigger Point Dry-Needling  Treatment instructions: Expect mild to moderate muscle soreness. S/S of pneumothorax if dry needled over a lung field, and to seek immediate medical attention should they occur. Patient verbalized understanding of these instructions and education.   Patient Consent Given: Yes Education handout provided: Previously provided Muscles treated: right coccygeus, puborectalis, ischiocavernous, superior transverse perineum Electrical stimulation performed: No Parameters: N/A Treatment  response/outcome: elongation of muscle and trigger point response.  Exercises: Stretches/mobility:cat cow 15x Windshield wiper stretch for hips 5x    05/06/2022 Manual: Soft tissue mobilization: to assess for dry needling externally to the ischiocavernosus, superior transverse, puborectalis Internal pelvic floor techniques:No emotional/communication barriers or cognitive limitation. Patient is motivated to learn. Patient understands and agrees with treatment goals and plan. PT explains patient will be examined in standing, sitting, and lying down to see how their muscles and joints work. When they are ready, they will be asked to remove their underwear so PT can examine their perineum. The patient is also given the option of providing their own chaperone as one is not provided in our facility. The patient also has the right and is explained the right to defer or refuse any part of the evaluation or treatment including the internal exam. With the patient's consent, PT will use one gloved finger to gently assess the muscles of the pelvic floor, seeing how well it contracts and relaxes and if there is muscle symmetry. After, the patient will get dressed and PT and patient will discuss exam findings and plan of care. PT and patient discuss plan of care, schedule, attendance policy and HEP activities. Therapist index finger going into the rectum to perform manual work to the internal anal sphincter and along the anterior wall of the rectum to elongate tissue Trigger Point Dry-Needling  Treatment instructions: Expect mild to moderate muscle soreness. S/S of pneumothorax if dry needled  over a lung field, and to seek immediate medical attention should they occur. Patient verbalized understanding of these instructions and education.   Patient Consent Given: Yes Education handout provided: Previously provided Muscles treated: ischiocavernosus, superior transverse, puborectalis Electrical stimulation performed:  No Parameters: N/A Treatment response/outcome: elongation of muscles and trigger point response   Neuromuscular re-education: Pelvic floor contraction training: diaphragmatic breathing to push the therapist finger out of the anal canal but had difficulty doing this. Suggested to patient to try blowing in a balloon to increase abdominal pressure         PATIENT EDUCATION:  [id number] Education details: Access Code: [email or web address], educated patient on how to move up to the next dilator, educated patient on how to use the dilator to push it out to facilitate a bowel movement Person educated: Patient Education method: Explanation, Demonstration, Tactile cues, and Verbal cues Education comprehension: verbalized understanding, returned demonstration, verbal cues required, tactile cues required, and needs further education     HOME EXERCISE PROGRAM: 03/11/2022 Access Code: [email or web address] URL: [email or web address] Date: 03/11/2022 Prepared by: Earlie Counts   Exercises -- Sidelying IT Band Foam Roll Mobilization  - 1 x daily - 7 x weekly - 3 sets - 10 reps - Hip Flexor Mobilization with Foam Roll  - 1 x daily - 7 x weekly - 3 sets - 10 reps - Piriformis Mobilization on Foam Roll  - 1 x daily - 7 x weekly - 3 sets - 10 reps - Adductor Mobilization with Foam Roll  - 1 x daily - 7 x weekly - 3 sets - 10 reps -  ASSESSMENT:   CLINICAL IMPRESSION: Patient is a 51 y.o. male  who was seen today for physical therapy treatment for constipation and Dyssynergic defecation. Patient reports his stool is longer than normal. He is able to use the third dilator. The stool diameter is the size of his index finger. It is diamond shape and sharp curve.  Patient will benefit from skilled therapy to improve constipation, elongation of the pelvic floor and improve the ability to push stool out.      OBJECTIVE IMPAIRMENTS decreased activity tolerance, decreased coordination, decreased endurance, decreased ROM,  decreased strength, increased fascial restrictions, increased muscle spasms, and impaired tone.    ACTIVITY LIMITATIONS toileting   PARTICIPATION LIMITATIONS:  none   PERSONAL FACTORS Time since onset of injury/illness/exacerbation are also affecting patient's functional outcome.    REHAB POTENTIAL: Excellent   CLINICAL DECISION MAKING: Stable/uncomplicated   EVALUATION COMPLEXITY: Low     GOALS: Goals reviewed with patient? Yes   SHORT TERM GOALS: Target date: 03/16/2022    Patient understands how to use the anal wand to reduce trigger points in the pelvic floor.  Baseline: Goal status: ongoing.    2.  Patient is independent with use of the anal dilators to open up the anus especially the band on the internal anal sphincter.  Baseline:  Goal status: met 03/27/2022   3.  Patient is independent with hip stretches to elongate the pelvic floor.  Baseline:  Goal status: Met 04/01/2022     LONG TERM GOALS: Target date: 05/11/2022    Patient is independent with HEP for flexibility and coordination of the pelvic floor.  Baseline:  Goal status: ongoing 05/06/2022  2.  Patient is able to push the therapist finger out of the anal canal with correct breath and relaxation of the pelvic floor.  Baseline:  Goal status: ongoing 05/06/2022   3.  Patient stool diameter is the  size of nickel or bigger due to elongation of the anal canal.  Baseline:  Goal status: ongoing 05/06/2022   4.  Patient reports his straining with bowel movement has decreased >/= 75% due to elongation of the pelvic floor.  Baseline:  Goal status: 05/06/2022      PLAN: PT FREQUENCY: 1x/week   PT DURATION: 12 weeks   PLANNED INTERVENTIONS: Therapeutic exercises, Therapeutic activity, Neuromuscular re-education, Patient/Family education, Self Care, Joint mobilization, Dry Needling, Cryotherapy, Moist heat, Taping, Biofeedback, and Manual therapy   PLAN FOR NEXT SESSION: work internally on the right side,  dry  needling to pelvic floor on right side,  work on pushing therapist finger out of the rectum, see how the home mobilization of right hip with strap is going Earlie Counts, PT 05/18/22 4:14 PM

## 2022-05-20 ENCOUNTER — Encounter: Payer: Commercial Managed Care - PPO | Admitting: Physical Therapy

## 2022-06-10 ENCOUNTER — Encounter: Payer: Self-pay | Admitting: Physical Therapy

## 2022-06-10 ENCOUNTER — Ambulatory Visit: Payer: Commercial Managed Care - PPO | Attending: Gastroenterology | Admitting: Physical Therapy

## 2022-06-10 DIAGNOSIS — K5902 Outlet dysfunction constipation: Secondary | ICD-10-CM | POA: Diagnosis present

## 2022-06-10 DIAGNOSIS — M6281 Muscle weakness (generalized): Secondary | ICD-10-CM | POA: Diagnosis present

## 2022-06-10 DIAGNOSIS — R278 Other lack of coordination: Secondary | ICD-10-CM | POA: Insufficient documentation

## 2022-06-10 NOTE — Therapy (Signed)
OUTPATIENT PHYSICAL THERAPY TREATMENT NOTE   Patient Name: Jacob Graham MRN: [id number] DOB:[birth year], 51 y.o., male Today's Date: 06/10/2022  PCP: [facility]  REFERRING PROVIDER: Mauri Pole, MD   END OF SESSION:   PT End of Session - 06/10/22 0848     Visit Number 11    Date for PT Re-Evaluation 07/06/22    Authorization Type [employer]    PT Start Time 0845    PT Stop Time 0925    PT Time Calculation (min) 40 min    Activity Tolerance Patient tolerated treatment well    Behavior During Therapy WFL for tasks assessed/performed             Past Medical History:  Diagnosis Date   Asthma    Diverticulitis    IBS (irritable bowel syndrome)    Rectal bleeding    Wears glasses    Past Surgical History:  Procedure Laterality Date   ANAL RECTAL MANOMETRY N/A 07/24/2020   Procedure: ANO RECTAL MANOMETRY;  Surgeon: Mauri Pole, MD;  Location: [facility];  Service: Endoscopy;  Laterality: N/A;   APPENDECTOMY  1999   colonoscopy  2008, 2014   [place], URETEROSCOPY AND STENT PLACEMENT Bilateral 10/25/2019   Procedure: CYSTOSCOPY WITH BILATERAL RETROGRADE, FIREFLY INJECTION  AND URETERAL CATHETER PLACEMENT;  Surgeon: Alexis Frock, MD;  Location: [facility];  Service: Urology;  Laterality: Bilateral;   EVALUATION UNDER ANESTHESIA WITH FISTULECTOMY N/A 10/09/2015   Procedure: EXAM UNDER ANESTHESIA; FISTULOTOMY;  Surgeon: Leighton Ruff, MD;  Location: [place];  Service: General;  Laterality: N/A;   [place]   Patient Active Problem List   Diagnosis Date Noted   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    Dyssynergic defecation    Diverticular disease 10/25/2019   Wears glasses    IBS (irritable bowel syndrome)    Rectal bleeding    Diverticulitis    Asthma    REFERRING DIAG:  K59.02 (ICD-10-CM) - Constipation, outlet dysfunction  K59.02 (ICD-10-CM) - Dyssynergic defecation      THERAPY  DIAG:  Muscle weakness (generalized)   Other lack of coordination   Dyssynergic defecation   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   Rationale for Evaluation and Treatment Rehabilitation   ONSET DATE: 01/03/2022   SUBJECTIVE:                                                                                                                                                                                            SUBJECTIVE STATEMENT: I am not doing the Miralax. The bowel movements are more amount and  stool is diamond shape. I have to do a lot of breathing to assist the stool to come out. I am on the third dilator and have one move size. I am having active release done to my left hip.    PAIN:  Are you having pain? No     PRECAUTIONS: None   WEIGHT BEARING RESTRICTIONS No   FALLS:  Has patient fallen in last 6 months? No   LIVING ENVIRONMENT: Lives with: lives with their spouse     OCCUPATION: full time; walking daily, weights 3 times per week, stretch daily   PLOF: Independent   PATIENT GOALS reduce constipation and understand to use the tools   PERTINENT HISTORY:  IBS; Appendectomy; Fistulectomy     BOWEL MOVEMENT Pain with bowel movement: No Type of bowel movement:Type (Bristol Stool Scale) Type 4 that is long ribbon stool, Frequency daily but small amount, and Strain Yes Fully empty rectum: No Leakage: No Pads: No Fiber supplement: Yes: metamucil but was making it trouble to have a bowel movement   URINATION Pain with urination: No Fully empty bladder: Yes:   Stream: Strong Urgency: No Frequency: getting to urinate has difficulty   OBJECTIVE: (objective measures completed at initial evaluation unless otherwise dated)      DIAGNOSTIC FINDINGS:  none     COGNITION:            Overall cognitive status: Within functional limits for tasks assessed                          SENSATION:            Light touch: Appears intact            Proprioception:  Appears intact                     POSTURE: No Significant postural limitations               PELVIC ALIGNMENT:   LUMBARAROM/PROM full lumbar ROM     LOWER EXTREMITY AROM/PROM:    A/PROM Right eval Left eval Left Right 05/06/2022 Left 05/06/2022  Hip flexion Pain at endrange Pain at endrange Full no pain Full no pain Full no pain  Hip internal rotation _0 Hip external rotation   45 80 65 65   (Blank rows = not tested)   LOWER EXTREMITY MMT:   MMT Right eval Left eval Right 05/06/2022 Left 05/06/2022  Hip extension 4/5 4/5 4/5 4/5  Hip abduction 4/5 4/5 5/5 5/5    PALPATION: GENERAL not able to push the therapist finger out of the anal canal               External Perineal Exam tenderness located  in the ischiocavernosus               Internal Pelvic Floor tenderness located on the left obturator internist, internal sphincter that is a tight band, iliococcygeus, anococcygeal ligament Patient confirms identification and approves PT to assess internal pelvic floor and treatment Yes   PELVIC MMT:   MMT eval 05/06/2022  Internal Anal Sphincter 4/5 4/5  External Anal Sphincter 4/5 4/5  Puborectalis 4/5 4/5  (Blank rows = not tested)     TONE: increased   TODAY'S TREATMENT  06/10/2022 Manual: Soft tissue mobilization: To assess for dry needling and manual work to elongate the pelvic floor mucscles.  Internal pelvic floor techniques:No  emotional/communication barriers or cognitive limitation. Patient is motivated to learn. Patient understands and agrees with treatment goals and plan. PT explains patient will be examined in standing, sitting, and lying down to see how their muscles and joints work. When they are ready, they will be asked to remove their underwear so PT can examine their perineum. The patient is also given the option of providing their own chaperone as one is not provided in our facility. The patient also has the right and is explained the right  to defer or refuse any part of the evaluation or treatment including the internal exam. With the patient's consent, PT will use one gloved finger to gently assess the muscles of the pelvic floor, seeing how well it contracts and relaxes and if there is muscle symmetry. After, the patient will get dressed and PT and patient will discuss exam findings and plan of care. PT and patient discuss plan of care, schedule, attendance policy and HEP activities.  Going through the anus working on the external and internal anal sphincter, and the anococcygeal ligament Trigger Point Dry-Needling  Treatment instructions: Expect mild to moderate muscle soreness. S/S of pneumothorax if dry needled over a lung field, and to seek immediate medical attention should they occur. Patient verbalized understanding of these instructions and education.  Patient Consent Given: Yes Education handout provided: Previously provided Muscles treated: external anal sphincter, perineal body, right ischiocavernosus, right puborectalis and coccygeus Electrical stimulation performed: No Parameters: N/A Treatment response/outcome: trigger point release, elongation of muscle   05/18/2022 Manual: Soft tissue mobilization: to assess for dry needling Manual work to the ischiocavernosus, superior transvers, puborectalis, iliococcygeus and external anal sphincter on the left to elongate after dry needling :Trigger Point Dry-Needling  Treatment instructions: Expect mild to moderate muscle soreness. S/S of pneumothorax if dry needled over a lung field, and to seek immediate medical attention should they occur. Patient verbalized understanding of these instructions and education.   Patient Consent Given: Yes Education handout provided: Previously provided Muscles treated: left ischiocavernosus, puborectalis, iliococcygeus, superior transverse Electrical stimulation performed: No Parameters: N/A Treatment response/outcome: elongation of  muscles and trigger point response   Exercises: Stretches/mobility:hip mobilization to left hip with      05/13/2022 Manual: Soft tissue mobilization:To assess for dry needling Manual work to the right ischiocavernosus, superior transverse perineum, coccygeus, iliococcygeus Internal pelvic floor techniques:No emotional/communication barriers or cognitive limitation. Patient is motivated to learn. Patient understands and agrees with treatment goals and plan. PT explains patient will be examined in standing, sitting, and lying down to see how their muscles and joints work. When they are ready, they will be asked to remove their underwear so PT can examine their perineum. The patient is also given the option of providing their own chaperone as one is not provided in our facility. The patient also has the right and is explained the right to defer or refuse any part of the evaluation or treatment including the internal exam. With the patient's consent, PT will use one gloved finger to gently assess the muscles of the pelvic floor, seeing how well it contracts and relaxes and if there is muscle symmetry. After, the patient will get dressed and PT and patient will discuss exam findings and plan of care. PT and patient discuss plan of care, schedule, attendance policy and HEP activities.  Going through the anus working on the puborectalis, internal anal sphincter, levator ani to elongate in right sidely Dry needling:Trigger Point Dry-Needling  Treatment instructions: Expect mild to  moderate muscle soreness. S/S of pneumothorax if dry needled over a lung field, and to seek immediate medical attention should they occur. Patient verbalized understanding of these instructions and education.   Patient Consent Given: Yes Education handout provided: Previously provided Muscles treated: right coccygeus, puborectalis, ischiocavernous, superior transverse perineum Electrical stimulation performed: No Parameters:  N/A Treatment response/outcome: elongation of muscle and trigger point response.  Exercises: Stretches/mobility:cat cow 15x Windshield wiper stretch for hips 5x    PATIENT EDUCATION:  [id number] Education details: Access Code: [email or web address], educated patient on how to move up to the next dilator, educated patient on how to use the dilator to push it out to facilitate a bowel movement Person educated: Patient Education method: Explanation, Demonstration, Tactile cues, and Verbal cues Education comprehension: verbalized understanding, returned demonstration, verbal cues required, tactile cues required, and needs further education     HOME EXERCISE PROGRAM: 03/11/2022 Access Code: [email or web address] URL: [email or web address] Date: 03/11/2022 Prepared by: Earlie Counts   Exercises -- Sidelying IT Band Foam Roll Mobilization  - 1 x daily - 7 x weekly - 3 sets - 10 reps - Hip Flexor Mobilization with Foam Roll  - 1 x daily - 7 x weekly - 3 sets - 10 reps - Piriformis Mobilization on Foam Roll  - 1 x daily - 7 x weekly - 3 sets - 10 reps - Adductor Mobilization with Foam Roll  - 1 x daily - 7 x weekly - 3 sets - 10 reps -  ASSESSMENT:   CLINICAL IMPRESSION: Patient is a 51 y.o. male  who was seen today for physical therapy treatment for constipation and Dyssynergic defecation. Patient is on the third dilator and has one more to go. Has difficulty with pushing the dilator out. Increased amount of stool but still diamond shape. Patient is using the wand to massage the outside of the rectum. Patient is not taking the Miralax.  Patient had less tightness in the anal canal and the muscles elongated well after the manual work. Patient will benefit from skilled therapy to improve constipation, elongation of the pelvic floor and improve the ability to push stool out.      OBJECTIVE IMPAIRMENTS decreased activity tolerance, decreased coordination, decreased endurance, decreased ROM, decreased strength,  increased fascial restrictions, increased muscle spasms, and impaired tone.    ACTIVITY LIMITATIONS toileting   PARTICIPATION LIMITATIONS:  none   PERSONAL FACTORS Time since onset of injury/illness/exacerbation are also affecting patient's functional outcome.    REHAB POTENTIAL: Excellent   CLINICAL DECISION MAKING: Stable/uncomplicated   EVALUATION COMPLEXITY: Low     GOALS: Goals reviewed with patient? Yes   SHORT TERM GOALS: Target date: 03/16/2022    Patient understands how to use the anal wand to reduce trigger points in the pelvic floor.  Baseline: Goal status: Met 06/10/2022   2.  Patient is independent with use of the anal dilators to open up the anus especially the band on the internal anal sphincter.  Baseline:  Goal status: met 03/27/2022   3.  Patient is independent with hip stretches to elongate the pelvic floor.  Baseline:  Goal status: Met 04/01/2022     LONG TERM GOALS: Target date: 05/11/2022    Patient is independent with HEP for flexibility and coordination of the pelvic floor.  Baseline:  Goal status: ongoing 05/06/2022  2.  Patient is able to push the therapist finger out of the anal canal with correct breath and relaxation of the pelvic floor.  Baseline:  Goal status: ongoing 05/06/2022  3.  Patient stool diameter is the size of nickel or bigger due to elongation of the anal canal.  Baseline: size of a diamond Goal status: ongoing 05/06/2022   4.  Patient reports his straining with bowel movement has decreased >/= 75% due to elongation of the pelvic floor.  Baseline:  Goal status: 05/06/2022      PLAN: PT FREQUENCY: 1x/week   PT DURATION: 12 weeks   PLANNED INTERVENTIONS: Therapeutic exercises, Therapeutic activity, Neuromuscular re-education, Patient/Family education, Self Care, Joint mobilization, Dry Needling, Cryotherapy, Moist heat, Taping, Biofeedback, and Manual therapy   PLAN FOR NEXT SESSION: work internally on the left side,  dry  needling to pelvic floor on left side,  work on pushing therapist finger out of the rectumCheryl Pearline Cables, PT 06/10/22 9:28 AM

## 2022-06-24 ENCOUNTER — Encounter: Payer: Self-pay | Admitting: Physical Therapy

## 2022-06-24 ENCOUNTER — Ambulatory Visit: Payer: Commercial Managed Care - PPO | Admitting: Physical Therapy

## 2022-06-24 DIAGNOSIS — R278 Other lack of coordination: Secondary | ICD-10-CM | POA: Diagnosis not present

## 2022-06-24 DIAGNOSIS — M6281 Muscle weakness (generalized): Secondary | ICD-10-CM

## 2022-06-24 DIAGNOSIS — K5902 Outlet dysfunction constipation: Secondary | ICD-10-CM

## 2022-06-24 NOTE — Therapy (Signed)
OUTPATIENT PHYSICAL THERAPY TREATMENT NOTE   Patient Name: Jacob Graham MRN: [id number] DOB:11-Dec-1970, 51 y.o., male Today's Date: 06/24/2022  PCP: [facility]   REFERRING PROVIDER: Mauri Pole, MD    END OF SESSION:   PT End of Session - 06/24/22 0800     Visit Number 12    Date for PT Re-Evaluation 09/16/22    Authorization Type [employer]    PT Start Time 0800    PT Stop Time 0840    PT Time Calculation (min) 40 min    Activity Tolerance Patient tolerated treatment well    Behavior During Therapy WFL for tasks assessed/performed             Past Medical History:  Diagnosis Date   Asthma    Diverticulitis    IBS (irritable bowel syndrome)    Rectal bleeding    Wears glasses    Past Surgical History:  Procedure Laterality Date   ANAL RECTAL MANOMETRY N/A 07/24/2020   Procedure: ANO RECTAL MANOMETRY;  Surgeon: Mauri Pole, MD;  Location: [facility];  Service: Endoscopy;  Laterality: N/A;   APPENDECTOMY  1999   colonoscopy  2008, 2014   [place], URETEROSCOPY AND STENT PLACEMENT Bilateral 10/25/2019   Procedure: CYSTOSCOPY WITH BILATERAL RETROGRADE, FIREFLY INJECTION  AND URETERAL CATHETER PLACEMENT;  Surgeon: Alexis Frock, MD;  Location: [facility];  Service: Urology;  Laterality: Bilateral;   EVALUATION UNDER ANESTHESIA WITH FISTULECTOMY N/A 10/09/2015   Procedure: EXAM UNDER ANESTHESIA; FISTULOTOMY;  Surgeon: Leighton Ruff, MD;  Location: [place];  Service: General;  Laterality: N/A;   [place]   Patient Active Problem List   Diagnosis Date Noted   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    Dyssynergic defecation    Diverticular disease 10/25/2019   Wears glasses    IBS (irritable bowel syndrome)    Rectal bleeding    Diverticulitis    Asthma    REFERRING DIAG:  K59.02 (ICD-10-CM) - Constipation, outlet dysfunction  K59.02 (ICD-10-CM) - Dyssynergic defecation      THERAPY  DIAG:  Muscle weakness (generalized)   Other lack of coordination   Dyssynergic defecation   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   Rationale for Evaluation and Treatment Rehabilitation   ONSET DATE: 01/03/2022   SUBJECTIVE:                                                                                                                                                                                            SUBJECTIVE STATEMENT: It is easier to go. I have to strain a lot.  I have increased volume of stool but I am straining in the abdomen. The stool is 2 fingers width. I only have done the dilators 2 times. I think I am ready for the next size and is the last one in the box. Stool is one long bowel movements instead of little pieces. Patient does not feel like he is fully emptying his rectum. I go every 2-3 days and go multiple times. I have a bowel movement first thing in the morning, lunch and dinner. I am still getting the Active release to my left hip.   PAIN:  Are you having pain? No     PRECAUTIONS: None   WEIGHT BEARING RESTRICTIONS No   FALLS:  Has patient fallen in last 6 months? No   LIVING ENVIRONMENT: Lives with: lives with their spouse     OCCUPATION: full time; walking daily, weights 3 times per week, stretch daily   PLOF: Independent   PATIENT GOALS reduce constipation and understand to use the tools   PERTINENT HISTORY:  IBS; Appendectomy; Fistulectomy     BOWEL MOVEMENT Pain with bowel movement: No Type of bowel movement:Type (Bristol Stool Scale) Type 4 that is long ribbon stool, Frequency daily but small amount, and Strain Yes Fully empty rectum: No Leakage: No Pads: No Fiber supplement: Yes: metamucil but was making it trouble to have a bowel movement   URINATION Pain with urination: No Fully empty bladder: Yes:   Stream: Strong Urgency: No Frequency: getting to urinate has difficulty   OBJECTIVE: (objective measures completed at initial  evaluation unless otherwise dated)       DIAGNOSTIC FINDINGS:  none     COGNITION:            Overall cognitive status: Within functional limits for tasks assessed                          SENSATION:            Light touch: Appears intact            Proprioception: Appears intact                     POSTURE: No Significant postural limitations               PELVIC ALIGNMENT:   LUMBARAROM/PROM full lumbar ROM     LOWER EXTREMITY AROM/PROM:    A/PROM Right eval Left eval Left Right 05/06/2022 Left 05/06/2022 Right  06/24/22 Left  06/24/22  Hip flexion Pain at endrange Pain at endrange _0   Hip internal rotation _1 Hip external rotation   45 80 65 65 65 55   (Blank rows = not tested)   LOWER EXTREMITY MMT:   MMT Right eval Left eval Right 05/06/2022 Left 05/06/2022 Right/Left 06/24/22  Hip extension 4/5 4/5 4/5 4/5 4/5  Hip abduction 4/5 4/5 5/5 5/5 5/5    PALPATION: GENERAL takes several times to push the therapist finger out of the canal               External Perineal Exam tenderness located  in the ischiocavernosus               Internal Pelvic Floor tenderness located on the left obturator internist, iliococcygeus, anococcygeal ligament Patient confirms identification and approves PT to assess internal  pelvic floor and treatment Yes   PELVIC MMT:   MMT eval 05/06/2022 06/24/2022  Internal Anal Sphincter 4/5 4/5 4/5 holding 20 sec  External Anal Sphincter 4/5 4/5 4/5 holding 20 sec  Puborectalis 4/5 4/5 4/5 holding 20 sec  (Blank rows = not tested)     TONE: increased   TODAY'S TREATMENT  06/24/22 Manual: Soft tissue mobilization: To assess for dry needling  Manual work to the ischiocavernosus, superior transverse, EAS and puborectalis to elongate after dry needling Internal pelvic floor techniques: Going through the rectum working on the puborectalis, along the  anococcygeal ligament, and left anteriorlateral rectum.  Trigger Point Dry-Needling  Treatment instructions: Expect mild to moderate muscle soreness. S/S of pneumothorax if dry needled over a lung field, and to seek immediate medical attention should they occur. Patient verbalized understanding of these instructions and education.  Patient Consent Given: Yes Education handout provided: Yes Muscles treated: left ischiocavernosus, superior transverse perineum, left EAS, left puborectalis Electrical stimulation performed: No Parameters: N/A Treatment response/outcome: elongation of muscles and trigger point response  Neuromuscular re-education: Pelvic floor contraction training: Therapist finger in the rectum contracting with lower abdominal engagement and pulling the puborectalis forward Supine transverse abdominus without bulging the lower abdomen and squeeze the ball.  Down training: Diaphragmatic breathing with elongation of the pelvic floor to push therapist finger out of the anal canal. Needed verbal cues to have a longer and more forceful breath Therapeutic activities: Functional strengthening activities: Reviewed bathroom technique, eating and breaking down his food better, adding in pelvic rotations to assist bowel movements.     06/10/2022 Manual: Soft tissue mobilization: To assess for dry needling and manual work to elongate the pelvic floor mucscles.  Internal pelvic floor techniques:No emotional/communication barriers or cognitive limitation. Patient is motivated to learn. Patient understands and agrees with treatment goals and plan. PT explains patient will be examined in standing, sitting, and lying down to see how their muscles and joints work. When they are ready, they will be asked to remove their underwear so PT can examine their perineum. The patient is also given the option of providing their own chaperone as one is not provided in our facility. The patient also has the  right and is explained the right to defer or refuse any part of the evaluation or treatment including the internal exam. With the patient's consent, PT will use one gloved finger to gently assess the muscles of the pelvic floor, seeing how well it contracts and relaxes and if there is muscle symmetry. After, the patient will get dressed and PT and patient will discuss exam findings and plan of care. PT and patient discuss plan of care, schedule, attendance policy and HEP activities.  Going through the anus working on the external and internal anal sphincter, and the anococcygeal ligament Trigger Point Dry-Needling  Treatment instructions: Expect mild to moderate muscle soreness. S/S of pneumothorax if dry needled over a lung field, and to seek immediate medical attention should they occur. Patient verbalized understanding of these instructions and education.   Patient Consent Given: Yes Education handout provided: Previously provided Muscles treated: external anal sphincter, perineal body, right ischiocavernosus, right puborectalis and coccygeus Electrical stimulation performed: No Parameters: N/A Treatment response/outcome: trigger point release, elongation of muscle     05/18/2022 Manual: Soft tissue mobilization: to assess for dry needling Manual work to the ischiocavernosus, superior transvers, puborectalis, iliococcygeus and external anal sphincter on the left to elongate after dry needling :Trigger Point Dry-Needling  Treatment instructions:  Expect mild to moderate muscle soreness. S/S of pneumothorax if dry needled over a lung field, and to seek immediate medical attention should they occur. Patient verbalized understanding of these instructions and education.   Patient Consent Given: Yes Education handout provided: Previously provided Muscles treated: left ischiocavernosus, puborectalis, iliococcygeus, superior transverse Electrical stimulation performed: No Parameters: N/A Treatment  response/outcome: elongation of muscles and trigger point response   Exercises: Stretches/mobility:hip mobilization to left hip with         PATIENT EDUCATION:  06/24/2022 Education details: Access Code: [email or web address] Person educated: Patient Education method: Explanation, Demonstration, Tactile cues, and Verbal cues Education comprehension: verbalized understanding, returned demonstration, verbal cues required, tactile cues required, and needs further education     HOME EXERCISE PROGRAM: 06/24/2022 Access Code: [email or web address] URL: [email or web address] Date: 06/24/2022 Prepared by: Earlie Counts  Exercises - Seated Diaphragmatic Breathing  - 1 x daily - 7 x weekly - 1 sets - 10 reps - Hooklying Transversus Abdominis Palpation  - 1 x daily - 7 x weekly - 1 sets - 10 reps - Supine Pelvic Floor Contraction  - 1 x daily - 7 x weekly - 1 sets - 5 reps - 40 sec hold   ASSESSMENT:   CLINICAL IMPRESSION: Patient is a 51 y.o. male  who was seen today for physical therapy treatment for constipation and Dyssynergic defecation. Patient is on the third dilator and has one more to go. Has difficulty with pushing the dilator out. Increased amount of stool in one long bowel movement but still diamond shape. He has a bowel movement every 2-3 days in the morning, after lunch and dinner so it is more regular. Patient is using the wand on vibration to massage the outside of the rectum. Patient is not taking the Miralax.  Patient had less tightness in the anal canal and the muscles elongated well after the manual work. Patient pelvic floor strength is 4/5 holding for 20 sec and needs to be able to hold for 40 sec. Patient will bulge his lower abdomen with contraction instead of bringing the lower abdomen downward putting strain on the pelvic floor. Patient will benefit from skilled therapy to improve constipation, elongation of the pelvic floor and improve the ability to push stool out.      OBJECTIVE  IMPAIRMENTS decreased activity tolerance, decreased coordination, decreased endurance, decreased ROM, decreased strength, increased fascial restrictions, increased muscle spasms, and impaired tone.    ACTIVITY LIMITATIONS toileting   PARTICIPATION LIMITATIONS:  none   PERSONAL FACTORS Time since onset of injury/illness/exacerbation are also affecting patient's functional outcome.    REHAB POTENTIAL: Excellent   CLINICAL DECISION MAKING: Stable/uncomplicated   EVALUATION COMPLEXITY: Low     GOALS: Goals reviewed with patient? Yes   SHORT TERM GOALS: Target date: 03/16/2022    Patient understands how to use the anal wand to reduce trigger points in the pelvic floor.  Baseline: Goal status: Met 06/10/2022   2.  Patient is independent with use of the anal dilators to open up the anus especially the band on the internal anal sphincter.  Baseline:  Goal status: met 03/27/2022   3.  Patient is independent with hip stretches to elongate the pelvic floor.  Baseline:  Goal status: Met 04/01/2022     LONG TERM GOALS: Target date: 05/11/2022    Patient is independent with HEP for flexibility and coordination of the pelvic floor.  Baseline:  Goal status: ongoing 06/24/2022  2.  Patient is able to push the therapist finger  out of the anal canal with correct breath and relaxation of the pelvic floor.  Baseline:  Goal status: ongoing 06/24/2022   3.  Patient stool diameter is the size of nickel or bigger due to elongation of the anal canal.  Baseline: size of a diamond Goal status: ongoing 06/24/2022   4.  Patient reports his straining with bowel movement has decreased >/= 75% due to elongation of the pelvic floor.  Baseline: strains more in the upper abdomen and will bulge the lower.  Goal status: 06/24/2022      PLAN: PT FREQUENCY: 1x/week   PT DURATION: 12 weeks   PLANNED INTERVENTIONS: Therapeutic exercises, Therapeutic activity, Neuromuscular re-education, Patient/Family  education, Self Care, Joint mobilization, Dry Needling, Cryotherapy, Moist heat, Taping, Biofeedback, and Manual therapy   PLAN FOR NEXT SESSION: work internally on the right side,  dry needling to pelvic floor on right  side,  work on pushing therapist finger out of the rectum, abdominal contraction with leg movements and arms  Earlie Counts, PT 06/24/22 9:13 AM

## 2022-07-08 ENCOUNTER — Ambulatory Visit: Payer: Commercial Managed Care - PPO | Admitting: Physical Therapy

## 2022-07-22 ENCOUNTER — Ambulatory Visit: Payer: Commercial Managed Care - PPO | Admitting: Physical Therapy

## 2022-07-24 ENCOUNTER — Ambulatory Visit: Payer: Commercial Managed Care - PPO | Attending: Gastroenterology | Admitting: Physical Therapy

## 2022-07-24 ENCOUNTER — Encounter: Payer: Self-pay | Admitting: Physical Therapy

## 2022-07-24 DIAGNOSIS — R278 Other lack of coordination: Secondary | ICD-10-CM | POA: Insufficient documentation

## 2022-07-24 DIAGNOSIS — K5902 Outlet dysfunction constipation: Secondary | ICD-10-CM

## 2022-07-24 DIAGNOSIS — M6281 Muscle weakness (generalized): Secondary | ICD-10-CM | POA: Diagnosis present

## 2022-07-24 NOTE — Therapy (Signed)
OUTPATIENT PHYSICAL THERAPY TREATMENT NOTE   Patient Name: Jacob Graham MRN: [id number] DOB:1970/09/01, 52 y.o., male Today's Date: 07/24/2022  PCP: [facility]  REFERRING PROVIDER: Mauri Pole, MD    END OF SESSION:   PT End of Session - 07/24/22 0808     Visit Number 13    Date for PT Re-Evaluation 09/16/22    Authorization Type [employer]    Authorization - Visit Number 2    Authorization - Number of Visits 60    PT Start Time 0800    PT Stop Time 0840    PT Time Calculation (min) 40 min    Activity Tolerance Patient tolerated treatment well    Behavior During Therapy WFL for tasks assessed/performed             Past Medical History:  Diagnosis Date   Asthma    Diverticulitis    IBS (irritable bowel syndrome)    Rectal bleeding    Wears glasses    Past Surgical History:  Procedure Laterality Date   ANAL RECTAL MANOMETRY N/A 07/24/2020   Procedure: ANO RECTAL MANOMETRY;  Surgeon: Mauri Pole, MD;  Location: [facility];  Service: Endoscopy;  Laterality: N/A;   APPENDECTOMY  1999   colonoscopy  2008, 2014   [place], URETEROSCOPY AND STENT PLACEMENT Bilateral 10/25/2019   Procedure: CYSTOSCOPY WITH BILATERAL RETROGRADE, FIREFLY INJECTION  AND URETERAL CATHETER PLACEMENT;  Surgeon: Alexis Frock, MD;  Location: [facility];  Service: Urology;  Laterality: Bilateral;   EVALUATION UNDER ANESTHESIA WITH FISTULECTOMY N/A 10/09/2015   Procedure: EXAM UNDER ANESTHESIA; FISTULOTOMY;  Surgeon: Leighton Ruff, MD;  Location: [place];  Service: General;  Laterality: N/A;   [place]   Patient Active Problem List   Diagnosis Date Noted   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    Dyssynergic defecation    Diverticular disease 10/25/2019   Wears glasses    IBS (irritable bowel syndrome)    Rectal bleeding    Diverticulitis    Asthma    REFERRING DIAG:  K59.02 (ICD-10-CM) - Constipation, outlet  dysfunction  K59.02 (ICD-10-CM) - Dyssynergic defecation      THERAPY DIAG:  Muscle weakness (generalized)   Other lack of coordination   Dyssynergic defecation   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   Rationale for Evaluation and Treatment Rehabilitation   ONSET DATE: 01/03/2022   SUBJECTIVE:  SUBJECTIVE STATEMENT: Increased tightness with dilators due increased stretch. I am able to poop better with the new technique.    PAIN:  Are you having pain? No     PRECAUTIONS: None   WEIGHT BEARING RESTRICTIONS No   FALLS:  Has patient fallen in last 6 months? No   LIVING ENVIRONMENT: Lives with: lives with their spouse     OCCUPATION: full time; walking daily, weights 3 times per week, stretch daily   PLOF: Independent   PATIENT GOALS reduce constipation and understand to use the tools   PERTINENT HISTORY:  IBS; Appendectomy; Fistulectomy     BOWEL MOVEMENT Pain with bowel movement: No Type of bowel movement:Type (Bristol Stool Scale) Type 4 that is long ribbon stool, Frequency daily but small amount, and Strain Yes Fully empty rectum: No Leakage: No Pads: No Fiber supplement: Yes: metamucil but was making it trouble to have a bowel movement   URINATION Pain with urination: No Fully empty bladder: Yes:   Stream: Strong Urgency: No Frequency: getting to urinate has difficulty   OBJECTIVE: (objective measures completed at initial evaluation unless otherwise dated)       DIAGNOSTIC FINDINGS:  none     COGNITION:            Overall cognitive status: Within functional limits for tasks assessed                          SENSATION:            Light touch: Appears intact            Proprioception: Appears intact                     POSTURE: No Significant postural  limitations               PELVIC ALIGNMENT:   LUMBARAROM/PROM full lumbar ROM     LOWER EXTREMITY AROM/PROM:    A/PROM Right eval Left eval Left Right 05/06/2022 Left 05/06/2022 Right  06/24/22 Left  06/24/22  Hip flexion Pain at endrange Pain at endrange Full no pain Full no pain Full no pain Full no pain Full no pain  Hip internal rotation 10 10   10 10 15 15   Hip external rotation   45 80 65 65 65 55   (Blank rows = not tested)   LOWER EXTREMITY MMT:   MMT Right eval Left eval Right 05/06/2022 Left 05/06/2022 Right/Left 06/24/22  Hip extension 4/5 4/5 4/5 4/5 4/5  Hip abduction 4/5 4/5 5/5 5/5 5/5    PALPATION: GENERAL takes several times to push the therapist finger out of the canal               External Perineal Exam tenderness located  in the ischiocavernosus               Internal Pelvic Floor tenderness located on the left obturator internist, iliococcygeus, anococcygeal ligament Patient confirms identification and approves PT to assess internal pelvic floor and treatment Yes   PELVIC MMT:   MMT eval 05/06/2022 06/24/2022  Internal Anal Sphincter 4/5 4/5 4/5 holding 20 sec  External Anal Sphincter 4/5 4/5 4/5 holding 20 sec  Puborectalis 4/5 4/5 4/5 holding 20 sec  (Blank rows = not tested)     TONE: increased   TODAY'S TREATMENT  07/24/22 Neuromuscular re-education: Core retraining: Transverse abdominus with yoga block squeeze 15 x Hip flexion isometric with yoga  block 10x each side Pelvic floor contraction training: Supine squeeze yoga block contracting the anus for 40 sec 5x Sidely reverse clam with yoga block between knees and engage core 10 x each side Side plank with top hand on yoga block 5x both sides Exercises: Stretches/mobility: Frog leg stretch working in the tight areas Z stretch going up and down 5 times each side Side lunge to stretch hip adductors going side to side Standing v shape with one hand on floor and twist  trunk  06/24/22 Manual: Soft tissue mobilization: To assess for dry needling  Manual work to the ischiocavernosus, superior transverse, EAS and puborectalis to elongate after dry needling Internal pelvic floor techniques: Going through the rectum working on the puborectalis, along the anococcygeal ligament, and left anteriorlateral rectum.  Trigger Point Dry-Needling  Treatment instructions: Expect mild to moderate muscle soreness. S/S of pneumothorax if dry needled over a lung field, and to seek immediate medical attention should they occur. Patient verbalized understanding of these instructions and education.   Patient Consent Given: Yes Education handout provided: Yes Muscles treated: left ischiocavernosus, superior transverse perineum, left EAS, left puborectalis Electrical stimulation performed: No Parameters: N/A Treatment response/outcome: elongation of muscles and trigger point response   Neuromuscular re-education: Pelvic floor contraction training: Therapist finger in the rectum contracting with lower abdominal engagement and pulling the puborectalis forward Supine transverse abdominus without bulging the lower abdomen and squeeze the ball.  Down training: Diaphragmatic breathing with elongation of the pelvic floor to push therapist finger out of the anal canal. Needed verbal cues to have a longer and more forceful breath Therapeutic activities: Functional strengthening activities: Reviewed bathroom technique, eating and breaking down his food better, adding in pelvic rotations to assist bowel movements.     06/10/2022 Manual: Soft tissue mobilization: To assess for dry needling and manual work to elongate the pelvic floor mucscles.  Internal pelvic floor techniques:No emotional/communication barriers or cognitive limitation. Patient is motivated to learn. Patient understands and agrees with treatment goals and plan. PT explains patient will be examined in standing, sitting,  and lying down to see how their muscles and joints work. When they are ready, they will be asked to remove their underwear so PT can examine their perineum. The patient is also given the option of providing their own chaperone as one is not provided in our facility. The patient also has the right and is explained the right to defer or refuse any part of the evaluation or treatment including the internal exam. With the patient's consent, PT will use one gloved finger to gently assess the muscles of the pelvic floor, seeing how well it contracts and relaxes and if there is muscle symmetry. After, the patient will get dressed and PT and patient will discuss exam findings and plan of care. PT and patient discuss plan of care, schedule, attendance policy and HEP activities.  Going through the anus working on the external and internal anal sphincter, and the anococcygeal ligament Trigger Point Dry-Needling  Treatment instructions: Expect mild to moderate muscle soreness. S/S of pneumothorax if dry needled over a lung field, and to seek immediate medical attention should they occur. Patient verbalized understanding of these instructions and education.   Patient Consent Given: Yes Education handout provided: Previously provided Muscles treated: external anal sphincter, perineal body, right ischiocavernosus, right puborectalis and coccygeus Electrical stimulation performed: No Parameters: N/A Treatment response/outcome: trigger point release, elongation of muscle     PATIENT EDUCATION:  07/24/2022 Education details: Access Code:  [email or web address] Person educated: Patient Education method: Explanation, Demonstration, Tactile cues, and Verbal cues Education comprehension: verbalized understanding, returned demonstration, verbal cues required, tactile cues required, and needs further education     HOME EXERCISE PROGRAM: 07/24/2022 Access Code: [email or web address] URL: [email or web address] Date:  07/24/2022 Prepared by: Earlie Counts  Exercises  - Supine Pelvic Floor Contraction  - 1 x daily - 7 x weekly - 1 sets - 5 reps - 40 sec hold - Hooklying Transversus Abdominis Palpation  - 1 x daily - 3 x weekly - 1 sets - 10 reps - Hooklying Isometric Hip Flexion  - 1 x daily - 3 x weekly - 2 sets - 10 reps - Beginner Reverse Clamshell  - 1 x daily - 3 x weekly - 1 sets - 10 reps - Side Plank on Knees  - 1 x daily - 3 x weekly - 2 sets - 5 reps     ASSESSMENT:   CLINICAL IMPRESSION: Patient is a 52 y.o. male  who was seen today for physical therapy treatment for constipation and Dyssynergic defecation. Patient is using the abdominals more to push the stool out and it makes it easier. He strains less in the upper abdomen and will contract  the lower abdominals. He has increased pelvic tightness with the dilators due to increased stress at work.  Patient will benefit from skilled therapy to improve constipation, elongation of the pelvic floor and improve the ability to push stool out.      OBJECTIVE IMPAIRMENTS decreased activity tolerance, decreased coordination, decreased endurance, decreased ROM, decreased strength, increased fascial restrictions, increased muscle spasms, and impaired tone.    ACTIVITY LIMITATIONS toileting   PARTICIPATION LIMITATIONS:  none   PERSONAL FACTORS Time since onset of injury/illness/exacerbation are also affecting patient's functional outcome.    REHAB POTENTIAL: Excellent   CLINICAL DECISION MAKING: Stable/uncomplicated   EVALUATION COMPLEXITY: Low     GOALS: Goals reviewed with patient? Yes   SHORT TERM GOALS: Target date: 03/16/2022    Patient understands how to use the anal wand to reduce trigger points in the pelvic floor.  Baseline: Goal status: Met 06/10/2022   2.  Patient is independent with use of the anal dilators to open up the anus especially the band on the internal anal sphincter.  Baseline:  Goal status: met 03/27/2022   3.   Patient is independent with hip stretches to elongate the pelvic floor.  Baseline:  Goal status: Met 04/01/2022     LONG TERM GOALS: Target date: 05/11/2022    Patient is independent with HEP for flexibility and coordination of the pelvic floor.  Baseline:  Goal status: ongoing 06/24/2022  2.  Patient is able to push the therapist finger out of the anal canal with correct breath and relaxation of the pelvic floor.  Baseline:  Goal status: ongoing 06/24/2022   3.  Patient stool diameter is the size of nickel or bigger due to elongation of the anal canal.  Baseline: size of a diamond Goal status: ongoing 06/24/2022   4.  Patient reports his straining with bowel movement has decreased >/= 75% due to elongation of the pelvic floor.  Baseline: strains less in the upper abdomen and will contract  the lower.  Goal status: 06/24/2022      PLAN: PT FREQUENCY: 1x/week   PT DURATION: 12 weeks   PLANNED INTERVENTIONS: Therapeutic exercises, Therapeutic activity, Neuromuscular re-education, Patient/Family education, Self Care, Joint mobilization, Dry Needling, Cryotherapy, Moist heat, Taping, Biofeedback, and Manual therapy  PLAN FOR NEXT SESSION: work internally on the right side,  dry needling to pelvic floor on right  side,  work on pushing therapist finger out of the rectum, abdominal contraction with leg movements and arms   Earlie Counts, PT 07/24/22 8:44 AM

## 2022-08-05 ENCOUNTER — Encounter: Payer: Self-pay | Admitting: Physical Therapy

## 2022-08-05 ENCOUNTER — Ambulatory Visit: Payer: Commercial Managed Care - PPO | Admitting: Physical Therapy

## 2022-08-05 DIAGNOSIS — R278 Other lack of coordination: Secondary | ICD-10-CM

## 2022-08-05 DIAGNOSIS — K5902 Outlet dysfunction constipation: Secondary | ICD-10-CM

## 2022-08-05 DIAGNOSIS — M6281 Muscle weakness (generalized): Secondary | ICD-10-CM

## 2022-08-05 NOTE — Therapy (Signed)
OUTPATIENT PHYSICAL THERAPY TREATMENT NOTE   Patient Name: Jacob Graham MRN: [id number] DOB:Sep 09, 1970, 52 y.o., male Today's Date: 08/05/2022  PCP: [facility]  REFERRING PROVIDER: Mauri Pole, MD   END OF SESSION:   PT End of Session - 08/05/22 0805     Visit Number 14    Date for PT Re-Evaluation 09/16/22    Authorization Type U[employer]    Authorization - Visit Number 3    Authorization - Number of Visits 60    PT Start Time 0800    PT Stop Time 0845    PT Time Calculation (min) 45 min    Activity Tolerance Patient tolerated treatment well    Behavior During Therapy WFL for tasks assessed/performed             Past Medical History:  Diagnosis Date   Asthma    Diverticulitis    IBS (irritable bowel syndrome)    Rectal bleeding    Wears glasses    Past Surgical History:  Procedure Laterality Date   ANAL RECTAL MANOMETRY N/A 07/24/2020   Procedure: ANO RECTAL MANOMETRY;  Surgeon: Mauri Pole, MD;  [facility]: [facility];  Service: Endoscopy;  Laterality: N/A;   APPENDECTOMY  1999   colonoscopy  2008, 2014   [place], URETEROSCOPY AND STENT PLACEMENT Bilateral 10/25/2019   Procedure: CYSTOSCOPY WITH BILATERAL RETROGRADE, FIREFLY INJECTION  AND URETERAL CATHETER PLACEMENT;  Surgeon: Alexis Frock, MD;  Location: [facility];  Service: Urology;  Laterality: Bilateral;   EVALUATION UNDER ANESTHESIA WITH FISTULECTOMY N/A 10/09/2015   Procedure: EXAM UNDER ANESTHESIA; FISTULOTOMY;  Surgeon: Leighton Ruff, MD;  Location: [place];  Service: General;  Laterality: N/A;   [place]   Patient Active Problem List   Diagnosis Date Noted   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    Dyssynergic defecation    Diverticular disease 10/25/2019   Wears glasses    IBS (irritable bowel syndrome)    Rectal bleeding    Diverticulitis    Asthma    REFERRING DIAG:  K59.02 (ICD-10-CM) - Constipation, outlet  dysfunction  K59.02 (ICD-10-CM) - Dyssynergic defecation      THERAPY DIAG:  Muscle weakness (generalized)   Other lack of coordination   Dyssynergic defecation   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   Rationale for Evaluation and Treatment Rehabilitation   ONSET DATE: 01/03/2022   SUBJECTIVE:  SUBJECTIVE STATEMENT: Increased in symptoms due to being under a lot of stress.    PAIN:  Are you having pain? No     PRECAUTIONS: None   WEIGHT BEARING RESTRICTIONS No   FALLS:  Has patient fallen in last 6 months? No   LIVING ENVIRONMENT: Lives with: lives with their spouse     OCCUPATION: full time; walking daily, weights 3 times per week, stretch daily   PLOF: Independent   PATIENT GOALS reduce constipation and understand to use the tools   PERTINENT HISTORY:  IBS; Appendectomy; Fistulectomy     BOWEL MOVEMENT Pain with bowel movement: No Type of bowel movement:Type (Bristol Stool Scale) Type 4 that is long ribbon stool, Frequency daily but small amount, and Strain Yes Fully empty rectum: No Leakage: No Pads: No Fiber supplement: Yes: metamucil but was making it trouble to have a bowel movement   URINATION Pain with urination: No Fully empty bladder: Yes:   Stream: Strong Urgency: No Frequency: getting to urinate has difficulty   OBJECTIVE: (objective measures completed at initial evaluation unless otherwise dated)       DIAGNOSTIC FINDINGS:  none     COGNITION:            Overall cognitive status: Within functional limits for tasks assessed                          SENSATION:            Light touch: Appears intact            Proprioception: Appears intact                     POSTURE: No Significant postural limitations               PELVIC ALIGNMENT:    LUMBARAROM/PROM full lumbar ROM     LOWER EXTREMITY AROM/PROM:    A/PROM Right eval Left eval Left Right 05/06/2022 Left 05/06/2022 Right  06/24/22 Left  06/24/22  Hip flexion Pain at endrange Pain at endrange Full no pain Full no pain Full no pain Full no pain Full no pain  Hip internal rotation 10 10   10 10 15 15   Hip external rotation   45 80 65 65 65 55   (Blank rows = not tested)   LOWER EXTREMITY MMT:   MMT Right eval Left eval Right 05/06/2022 Left 05/06/2022 Right/Left 06/24/22  Hip extension 4/5 4/5 4/5 4/5 4/5  Hip abduction 4/5 4/5 5/5 5/5 5/5    PALPATION: GENERAL takes several times to push the therapist finger out of the canal               External Perineal Exam tenderness located  in the ischiocavernosus               Internal Pelvic Floor tenderness located on the left obturator internist, iliococcygeus, anococcygeal ligament Patient confirms identification and approves PT to assess internal pelvic floor and treatment Yes   PELVIC MMT:   MMT eval 05/06/2022 06/24/2022  Internal Anal Sphincter 4/5 4/5 4/5 holding 20 sec  External Anal Sphincter 4/5 4/5 4/5 holding 20 sec  Puborectalis 4/5 4/5 4/5 holding 20 sec  (Blank rows = not tested)     TONE: increased   TODAY'S TREATMENT  08/05/22 Manual: Soft tissue mobilization: Manual work to the abdomen to reduce restrictions Circular massage to the abdomen to promote the peristalic motion of  the intestines and reviewed with patient on how to perform at home Neuromuscular re-education: Pelvic floor contraction training: Supine squeeze yoga block contracting the anus for 40 sec 5x Sidely reverse clam with yoga block between knees and engage core 10 x each side press top hand into ball Side plank with top hand on yoga block 5x both sides Down training: Sit on tennis ball and massage the pelvic floor Discussed with patient on using the calm app to work on down training the pelvic floor and reduce  stress  07/24/22 Neuromuscular re-education: Core retraining: Transverse abdominus with yoga block squeeze 15 x Hip flexion isometric with yoga block 10x each side Pelvic floor contraction training: Supine squeeze yoga block contracting the anus for 40 sec 5x Sidely reverse clam with yoga block between knees and engage core 10 x each side Side plank with top hand on yoga block 5x both sides Exercises: Stretches/mobility: Frog leg stretch working in the tight areas Z stretch going up and down 5 times each side Side lunge to stretch hip adductors going side to side Standing v shape with one hand on floor and twist trunk   06/24/22 Manual: Soft tissue mobilization: To assess for dry needling  Manual work to the ischiocavernosus, superior transverse, EAS and puborectalis to elongate after dry needling Internal pelvic floor techniques: Going through the rectum working on the puborectalis, along the anococcygeal ligament, and left anteriorlateral rectum.  Trigger Point Dry-Needling  Treatment instructions: Expect mild to moderate muscle soreness. S/S of pneumothorax if dry needled over a lung field, and to seek immediate medical attention should they occur. Patient verbalized understanding of these instructions and education.   Patient Consent Given: Yes Education handout provided: Yes Muscles treated: left ischiocavernosus, superior transverse perineum, left EAS, left puborectalis Electrical stimulation performed: No Parameters: N/A Treatment response/outcome: elongation of muscles and trigger point response   Neuromuscular re-education: Pelvic floor contraction training: Therapist finger in the rectum contracting with lower abdominal engagement and pulling the puborectalis forward Supine transverse abdominus without bulging the lower abdomen and squeeze the ball.  Down training: Diaphragmatic breathing with elongation of the pelvic floor to push therapist finger out of the anal  canal. Needed verbal cues to have a longer and more forceful breath Therapeutic activities: Functional strengthening activities: Reviewed bathroom technique, eating and breaking down his food better, adding in pelvic rotations to assist bowel movements.     PATIENT EDUCATION:  07/24/2022 Education details: Access Code: [email or web address] Person educated: Patient Education method: Explanation, Demonstration, Tactile cues, and Verbal cues Education comprehension: verbalized understanding, returned demonstration, verbal cues required, tactile cues required, and needs further education     HOME EXERCISE PROGRAM: 07/24/2022 Access Code: [email or web address] URL: [email or web address] Date: 07/24/2022 Prepared by: Eulis Foster   Exercises   - Supine Pelvic Floor Contraction  - 1 x daily - 7 x weekly - 1 sets - 5 reps - 40 sec hold - Hooklying Transversus Abdominis Palpation  - 1 x daily - 3 x weekly - 1 sets - 10 reps - Hooklying Isometric Hip Flexion  - 1 x daily - 3 x weekly - 2 sets - 10 reps - Beginner Reverse Clamshell  - 1 x daily - 3 x weekly - 1 sets - 10 reps - Side Plank on Knees  - 1 x daily - 3 x weekly - 2 sets - 5 reps     ASSESSMENT:   CLINICAL IMPRESSION: Patient is a 52 y.o. male  who was seen today for physical  therapy treatment for constipation and Dyssynergic defecation. Patient has had stressful weeks since last session. He is able to engage the lower abdomen correctly. He has some restrictions in the left lower quadrant that still need to be worked on. Patient will resume his use of the dilators. He is using the right abdominal contraction for pushing the stool out. Patient will benefit from skilled therapy to improve constipation, elongation of the pelvic floor and improve the ability to push stool out.      OBJECTIVE IMPAIRMENTS decreased activity tolerance, decreased coordination, decreased endurance, decreased ROM, decreased strength, increased fascial restrictions,  increased muscle spasms, and impaired tone.    ACTIVITY LIMITATIONS toileting   PARTICIPATION LIMITATIONS:  none   PERSONAL FACTORS Time since onset of injury/illness/exacerbation are also affecting patient's functional outcome.    REHAB POTENTIAL: Excellent   CLINICAL DECISION MAKING: Stable/uncomplicated   EVALUATION COMPLEXITY: Low     GOALS: Goals reviewed with patient? Yes   SHORT TERM GOALS: Target date: 03/16/2022    Patient understands how to use the anal wand to reduce trigger points in the pelvic floor.  Baseline: Goal status: Met 06/10/2022   2.  Patient is independent with use of the anal dilators to open up the anus especially the band on the internal anal sphincter.  Baseline:  Goal status: met 03/27/2022   3.  Patient is independent with hip stretches to elongate the pelvic floor.  Baseline:  Goal status: Met 04/01/2022     LONG TERM GOALS: Target date: 05/11/2022    Patient is independent with HEP for flexibility and coordination of the pelvic floor.  Baseline:  Goal status: ongoing 06/24/2022  2.  Patient is able to push the therapist finger out of the anal canal with correct breath and relaxation of the pelvic floor.  Baseline:  Goal status: ongoing 06/24/2022   3.  Patient stool diameter is the size of nickel or bigger due to elongation of the anal canal.  Baseline: size of a diamond Goal status: ongoing 06/24/2022   4.  Patient reports his straining with bowel movement has decreased >/= 75% due to elongation of the pelvic floor.  Baseline: strains less in the upper abdomen and will contract  the lower.  Goal status: 06/24/2022      PLAN: PT FREQUENCY: 1x/week   PT DURATION: 12 weeks   PLANNED INTERVENTIONS: Therapeutic exercises, Therapeutic activity, Neuromuscular re-education, Patient/Family education, Self Care, Joint mobilization, Dry Needling, Cryotherapy, Moist heat, Taping, Biofeedback, and Manual therapy   PLAN FOR NEXT SESSION: work on  the lower lower quadrant of the abdomen.  abdominal contraction with leg movements and arms   Earlie Counts, PT 08/05/22 8:45 AM

## 2022-08-26 ENCOUNTER — Ambulatory Visit: Payer: Commercial Managed Care - PPO | Attending: Gastroenterology | Admitting: Physical Therapy

## 2022-08-26 DIAGNOSIS — R278 Other lack of coordination: Secondary | ICD-10-CM | POA: Insufficient documentation

## 2022-08-26 DIAGNOSIS — M6281 Muscle weakness (generalized): Secondary | ICD-10-CM | POA: Insufficient documentation

## 2022-08-26 DIAGNOSIS — K5902 Outlet dysfunction constipation: Secondary | ICD-10-CM | POA: Insufficient documentation

## 2022-09-09 ENCOUNTER — Encounter: Payer: Self-pay | Admitting: Physical Therapy

## 2022-09-09 ENCOUNTER — Ambulatory Visit: Payer: Commercial Managed Care - PPO | Attending: Gastroenterology | Admitting: Physical Therapy

## 2022-09-09 DIAGNOSIS — K5902 Outlet dysfunction constipation: Secondary | ICD-10-CM | POA: Diagnosis present

## 2022-09-09 DIAGNOSIS — R278 Other lack of coordination: Secondary | ICD-10-CM | POA: Insufficient documentation

## 2022-09-09 NOTE — Therapy (Signed)
OUTPATIENT PHYSICAL THERAPY TREATMENT NOTE   Patient Name: Jacob Graham MRN: [id number] DOB:1970/12/15, 52 y.o., male Today's Date: 09/09/2022  PCP: [facility]  REFERRING PROVIDER: Mauri Pole, MD    END OF SESSION:   PT End of Session - 09/09/22 0756     Visit Number 15    Date for PT Re-Evaluation 09/16/22    Authorization Type U[employer]    Authorization - Visit Number 4    Authorization - Number of Visits 60    PT Start Time 0800    PT Stop Time 0840    PT Time Calculation (min) 40 min    Activity Tolerance Patient tolerated treatment well    Behavior During Therapy WFL for tasks assessed/performed             Past Medical History:  Diagnosis Date   Asthma    Diverticulitis    IBS (irritable bowel syndrome)    Rectal bleeding    Wears glasses    Past Surgical History:  Procedure Laterality Date   ANAL RECTAL MANOMETRY N/A 07/24/2020   Procedure: ANO RECTAL MANOMETRY;  Surgeon: Mauri Pole, MD;  Location: [facility];  Service: Endoscopy;  Laterality: N/A;   APPENDECTOMY  1999   colonoscopy  2008, 2014   [place], URETEROSCOPY AND STENT PLACEMENT Bilateral 10/25/2019   Procedure: CYSTOSCOPY WITH BILATERAL RETROGRADE, FIREFLY INJECTION  AND URETERAL CATHETER PLACEMENT;  Surgeon: Alexis Frock, MD;  Location: [facility];  Service: Urology;  Laterality: Bilateral;   EVALUATION UNDER ANESTHESIA WITH FISTULECTOMY N/A 10/09/2015   Procedure: EXAM UNDER ANESTHESIA; FISTULOTOMY;  Surgeon: Leighton Ruff, MD;  Location: [facility];  Service: General;  Laterality: N/A;   [place]   Patient Active Problem List   Diagnosis Date Noted   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    Dyssynergic defecation    Diverticular disease 10/25/2019   Wears glasses    IBS (irritable bowel syndrome)    Rectal bleeding    Diverticulitis    Asthma    REFERRING DIAG:  K59.02 (ICD-10-CM) - Constipation, outlet  dysfunction  K59.02 (ICD-10-CM) - Dyssynergic defecation      THERAPY DIAG:  Muscle weakness (generalized)   Other lack of coordination   Dyssynergic defecation   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   Rationale for Evaluation and Treatment Rehabilitation   ONSET DATE: 01/03/2022   SUBJECTIVE:  SUBJECTIVE STATEMENT:  I took Dayquil and it made my bowel movements worse. The pelvic floor has been okay. I had a IBS attack this past weekend and struggled. I am happy with my pelvic floor.    PAIN:  Are you having pain? No     PRECAUTIONS: None   WEIGHT BEARING RESTRICTIONS No   FALLS:  Has patient fallen in last 6 months? No   LIVING ENVIRONMENT: Lives with: lives with their spouse     OCCUPATION: full time; walking daily, weights 3 times per week, stretch daily   PLOF: Independent   PATIENT GOALS reduce constipation and understand to use the tools   PERTINENT HISTORY:  IBS; Appendectomy; Fistulectomy     BOWEL MOVEMENT Pain with bowel movement: No Type of bowel movement:Type (Bristol Stool Scale) Type 4 that is long ribbon stool, Frequency daily but small amount, and Strain Yes Fully empty rectum: No Leakage: No Pads: No Fiber supplement: Yes: metamucil but was making it trouble to have a bowel movement   URINATION Pain with urination: No Fully empty bladder: Yes:   Stream: Strong Urgency: No Frequency: getting to urinate has difficulty   OBJECTIVE: (objective measures completed at initial evaluation unless otherwise dated)       DIAGNOSTIC FINDINGS:  none     COGNITION:            Overall cognitive status: Within functional limits for tasks assessed                          SENSATION:            Light touch: Appears intact            Proprioception: Appears  intact                     POSTURE: No Significant postural limitations               PELVIC ALIGNMENT:   LUMBARAROM/PROM full lumbar ROM     LOWER EXTREMITY AROM/PROM:    A/PROM Right eval Left eval Left Right 05/06/2022 Left 05/06/2022 Right  06/24/22 Left  06/24/22  Hip flexion Pain at endrange Pain at endrange Full no pain Full no pain Full no pain Full no pain Full no pain  Hip internal rotation '10 10   10 10 15 15  '$ Hip external rotation   45 80 65 65 65 55   (Blank rows = not tested)   LOWER EXTREMITY MMT:   MMT Right eval Left eval Right 05/06/2022 Left 05/06/2022 Right/Left 06/24/22 Right/left  09/09/22  Hip extension 4/5 4/5 4/5 4/5 4/5 5/5  Hip abduction 4/5 4/5 5/5 5/5 5/5 5/5    PALPATION: GENERAL takes several times to push the therapist finger out of the canal               External Perineal Exam tenderness located  in the ischiocavernosus               Internal Pelvic Floor tenderness located on the left obturator internist, iliococcygeus, anococcygeal ligament Patient confirms identification and approves PT to assess internal pelvic floor and treatment Yes   PELVIC MMT:   MMT eval 05/06/2022 06/24/2022  Internal Anal Sphincter 4/5 4/5 4/5 holding 20 sec  External Anal Sphincter 4/5 4/5 4/5 holding 20 sec  Puborectalis 4/5 4/5 4/5 holding 20 sec  (Blank rows = not tested)     TONE: increased  TODAY'S TREATMENT  09/09/22 Neuromuscular re-education: Core facilitation: Transverse abdominus contraction with breathing 10x Down training: Sitting on ball with diaphragmatic breathing to work on pelvic floor drop and massage the pelvic floor Exercises: Stretches/mobility: Discussed with patient using the rectal dilator to stretch the anal region and use the wand to massage around the anus Strengthening: Squat with 25# in each hand  Offset deadlift with 25# in each hand to work the [employer] swing with 5 pound pulleys to work on [employer] with  bicep curl 10# each side Tricep push with hip extension Side lunge holding 20# 10x each side  08/05/22 Manual: Soft tissue mobilization: Manual work to the abdomen to reduce restrictions Circular massage to the abdomen to promote the peristalic motion of the intestines and reviewed with patient on how to perform at home Neuromuscular re-education: Pelvic floor contraction training: Supine squeeze yoga block contracting the anus for 40 sec 5x Sidely reverse clam with yoga block between knees and engage core 10 x each side press top hand into ball Side plank with top hand on yoga block 5x both sides Down training: Sit on tennis ball and massage the pelvic floor Discussed with patient on using the calm app to work on down training the pelvic floor and reduce stress   07/24/22 Neuromuscular re-education: Core retraining: Transverse abdominus with yoga block squeeze 15 x Hip flexion isometric with yoga block 10x each side Pelvic floor contraction training: Supine squeeze yoga block contracting the anus for 40 sec 5x Sidely reverse clam with yoga block between knees and engage core 10 x each side Side plank with top hand on yoga block 5x both sides Exercises: Stretches/mobility: Frog leg stretch working in the tight areas Z stretch going up and down 5 times each side Side lunge to stretch hip adductors going side to side Standing v shape with one hand on floor and twist trunk    PATIENT EDUCATION:  09/09/2022 Education details: Access Code: [email or web address] Person educated: Patient Education method: Explanation, Demonstration, Tactile cues, and Verbal cues Education comprehension: verbalized understanding, returned demonstration, verbal cues required, tactile cues required, and needs further education     HOME EXERCISE PROGRAM: 09/09/2022 Access Code: [email or web address] URL: [email or web address] Date: 07/24/2022 Prepared by: Earlie Counts   Exercises   - Supine Pelvic Floor  Contraction  - 1 x daily - 7 x weekly - 1 sets - 5 reps - 40 sec hold - Hooklying Transversus Abdominis Palpation  - 1 x daily - 3 x weekly - 1 sets - 10 reps - Hooklying Isometric Hip Flexion  - 1 x daily - 3 x weekly - 2 sets - 10 reps - Beginner Reverse Clamshell  - 1 x daily - 3 x weekly - 1 sets - 10 reps - Side Plank on Knees  - 1 x daily - 3 x weekly - 2 sets - 5 reps     ASSESSMENT:   CLINICAL IMPRESSION: Patient is a 52 y.o. male  who was seen today for physical therapy treatment for constipation and Dyssynergic defecation. Patient can fully empty his rectum with a large size stool that is a nickel or larger diameter. Patient does not strain as much.  Pelvic floor strength is 4/5 holding for 10 sec. Patient has full strength of bilateral hips. Patient is able to perform diaphragmatic breathing to relax his pelvic floor. Patient understands how to exercise to work the core and extremities. He is using the rectal dilator to expand the anal canal and the  rectal wand to relax the anal sphincter muscles.    OBJECTIVE IMPAIRMENTS decreased activity tolerance, decreased coordination, decreased endurance, decreased ROM, decreased strength, increased fascial restrictions, increased muscle spasms, and impaired tone.    ACTIVITY LIMITATIONS toileting   PARTICIPATION LIMITATIONS:  none   PERSONAL FACTORS Time since onset of injury/illness/exacerbation are also affecting patient's functional outcome.    REHAB POTENTIAL: Excellent   CLINICAL DECISION MAKING: Stable/uncomplicated   EVALUATION COMPLEXITY: Low     GOALS: Goals reviewed with patient? Yes   SHORT TERM GOALS: Target date: 03/16/2022    Patient understands how to use the anal wand to reduce trigger points in the pelvic floor.  Baseline: Goal status: Met 06/10/2022   2.  Patient is independent with use of the anal dilators to open up the anus especially the band on the internal anal sphincter.  Baseline:  Goal status: met  03/27/2022   3.  Patient is independent with hip stretches to elongate the pelvic floor.  Baseline:  Goal status: Met 04/01/2022     LONG TERM GOALS: Target date: 05/11/2022    Patient is independent with HEP for flexibility and coordination of the pelvic floor.  Baseline:  Goal status: Met 09/09/22   2.  Patient is able to push the therapist finger out of the anal canal with correct breath and relaxation of the pelvic floor.  Baseline:  Goal status: Met 09/09/22   3.  Patient stool diameter is the size of nickel or bigger due to elongation of the anal canal.  Baseline: size of a diamond Goal status: met 09/09/22   4.  Patient reports his straining with bowel movement has decreased >/= 75% due to elongation of the pelvic floor.  Baseline: strains less in the upper abdomen and will contract  the lower.  Goal status: Met 09/09/22      PLAN: Discharge to [facility], PT 09/09/22 7:58 AM   PHYSICAL THERAPY DISCHARGE SUMMARY  Visits from Start of Care: 15  Current functional level related to goals / functional outcomes: See above.    Remaining deficits: See above    Education / Equipment: HEP   Patient agrees to discharge. Patient goals were met. Patient is being discharged due to meeting the stated rehab goals. Thank you for the referral. Earlie Counts, PT 09/09/22 8:43 AM

## 2022-09-23 ENCOUNTER — Encounter: Payer: Commercial Managed Care - PPO | Admitting: Physical Therapy

## 2023-02-04 ENCOUNTER — Telehealth: Payer: Self-pay | Admitting: Gastroenterology

## 2023-02-04 NOTE — Telephone Encounter (Signed)
Idiopathic constipation and dyssynergic defection last seen 03/2022. Can he have a new referral to PT for pelvic floor training with biofeed back?

## 2023-02-04 NOTE — Telephone Encounter (Signed)
Inbound call from patient requesting a referral back to [facility]. States he is having complications with his pelvic floor. Requesting a call back to discuss further. Please advise, thank you.

## 2023-02-05 ENCOUNTER — Other Ambulatory Visit: Payer: Self-pay

## 2023-02-05 DIAGNOSIS — K5902 Outlet dysfunction constipation: Secondary | ICD-10-CM

## 2023-02-05 NOTE — Telephone Encounter (Signed)
Ok to send referral to pelvic floor physical therapy for biofeedback and pelvic floor exercises.

## 2023-03-04 NOTE — Therapy (Signed)
OUTPATIENT PHYSICAL THERAPY MALE PELVIC EVALUATION   Patient Name: Jacob Graham MRN: [id number] DOB:[birth year], 52 y.o., male Today's Date: 03/05/2023  END OF SESSION:  PT End of Session - 03/05/23 0803     Visit Number 1    Date for PT Re-Evaluation 06/25/23    Authorization Type UHC    PT Start Time 0800    PT Stop Time 0920    PT Time Calculation (min) 80 min    Activity Tolerance Patient tolerated treatment well    Behavior During Therapy WFL for tasks assessed/performed             Past Medical History:  Diagnosis Date   Asthma    Diverticulitis    IBS (irritable bowel syndrome)    Rectal bleeding    Wears glasses    Past Surgical History:  Procedure Laterality Date   ANAL RECTAL MANOMETRY N/A 07/24/2020   Procedure: ANO RECTAL MANOMETRY;  Surgeon: Napoleon Form, MD;  Location: [facility];  Service: Endoscopy;  Laterality: N/A;   APPENDECTOMY  1999   colonoscopy  2008, 2014   CYSTOSCOPY WITH RETROGRADE PYELOGRAM, URETEROSCOPY AND STENT PLACEMENT Bilateral 10/25/2019   Procedure: CYSTOSCOPY WITH BILATERAL RETROGRADE, FIREFLY INJECTION  AND URETERAL CATHETER PLACEMENT;  Surgeon: Sebastian Ache, MD;  Location: [facility];  Service: Urology;  Laterality: Bilateral;   EVALUATION UNDER ANESTHESIA WITH FISTULECTOMY N/A 10/09/2015   Procedure: EXAM UNDER ANESTHESIA; FISTULOTOMY;  Surgeon: Romie Levee, MD;  Location: [facility];  Service: General;  Laterality: N/A;   KNEE SURGERY  1993   Patient Active Problem List   Diagnosis Date Noted   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    Dyssynergic defecation    Diverticular disease 10/25/2019   Wears glasses    IBS (irritable bowel syndrome)    Rectal bleeding    Diverticulitis    Asthma     PCP: [facility]  REFERRING PROVIDER: Napoleon Form, MD   REFERRING DIAG: [phone] (ICD-10-CM) - Dyssynergic defecation   THERAPY DIAG:  Other lack of coordination  Dyssynergic  defecation  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  Muscle weakness (generalized)  Rationale for Evaluation and Treatment: Rehabilitation  ONSET DATE: 02/23/23  SUBJECTIVE:                                                                                                                                                                                           SUBJECTIVE STATEMENT: I started to have the tightness and not able to have a bowel movement and needed to use the Miralax. I was doing the stretches and dilators which did not  help.  Stress can trigger the symptoms. Using the dilators every other day. I use the vibrating wand but not all the way.  Fluid intake: water, liquid IV  PAIN:  Are you having pain? Yes NPRS scale: 6/10 Pain location: rectum Pain type: sharp Pain description: intermittent   Aggravating factors: while having a bowel movement and using the dilators Relieving factors: not having a bowel movement  PRECAUTIONS: None  RED FLAGS: None   WEIGHT BEARING RESTRICTIONS: No  FALLS:  Has patient fallen in last 6 months? Yes. Number of falls fell at work with a full back pack and was going too fast but not due to balance  LIVING ENVIRONMENT: Lives with: lives with their spouse  OCCUPATION: sitting  PLOF: Independent  PATIENT GOALS: reduce tension in the anus  PERTINENT HISTORY:  Diverticulitis; IBS; Appendectomy;    BOWEL MOVEMENT: Pain with bowel movement: Yes  Type of bowel movement:Type (Bristol Stool Scale) ribbon short stool, Frequency every 2-3 days, and Strain No, needs to focus on bowel movements to use the correct technique Fully empty rectum: No Leakage: Yes: after a bowel movement Pads: No Fiber supplement: Yes: Miralax  URINATION: Pain with urination: No Fully empty bladder: Yes: times he does not feel like he has to urinate after drinking a lot of water, decreased sensation Stream:  has to work at his stream Urgency: No, sit to  stand at work will get sensation to have to urinate Frequency: average Leakage:  none  INTERCOURSE:no issues    OBJECTIVE:   DIAGNOSTIC FINDINGS:  none   COGNITION: Overall cognitive status: Within functional limits for tasks assessed     SENSATION: Light touch: Appears intact Proprioception: Appears intact     POSTURE: No Significant postural limitations  PELVIC ALIGNMENT:  LUMBARAROM/PROM:  A/PROM A/PROM  eval  Right rotation Decreased by 25% with pain in right groin   (Blank rows = not tested)  LOWER EXTREMITY AROM/PROM:  P/PROM Right eval Left eval  Hip flexion Full with pain in left groin Full with pain in left groin  Hip abduction 25 25 with pain in groin  Hip internal rotation 10 with pain in groin 20 with pain in groin  Hip external rotation 45 50   (Blank rows = not tested)  LOWER EXTREMITY MMT:  MMT Right eval Left eval  Hip abduction 5/5 4/5   PALPATION:             External Perineal Exam able to pucker the anus              Internal Pelvic Floor the puborectalis is not able to relax, tenderness located in the right side of the puborectalis, tightness in left puborectalis, iliococcygeus and obturator internist Patient confirms identification and approves PT to assess internal pelvic floor and treatment Yes  PELVIC MMT:   MMT eval  Internal Anal Sphincter 3/5  External Anal Sphincter 3/5  Puborectalis 4/5  (Blank rows = not tested)  TONE: increased  TODAY'S TREATMENT:  DATE: 03/05/23  EVAL Trigger Point Dry-Needling  Treatment instructions: Expect mild to moderate muscle soreness. S/S of pneumothorax if dry needled over a lung field, and to seek immediate medical attention should they occur. Patient verbalized understanding of these instructions and education.  Patient Consent Given: Yes Education handout  provided: Yes Muscles treated: puborectalis, iliococcygeus Electrical stimulation performed: No Parameters: N/A Treatment response/outcome: elongation of muscle and trigger point response Manual work to the muscles to work on elongation after dry needling RUSI Using the RUSI with transperineal setting at the perineal body working on relaxation of the pelvic floor     PATIENT EDUCATION:  03/05/23 Education details: how to relax the pelvic floor, using his dilator for 10 minutes, information on dry needling Person educated: Patient Education method: Explanation, Demonstration, Tactile cues, Verbal cues, and Handouts Education comprehension: verbalized understanding, returned demonstration, verbal cues required, tactile cues required, and needs further education  HOME EXERCISE PROGRAM: See above.   ASSESSMENT:  CLINICAL IMPRESSION: Patient is a 52 y.o. male who was seen today for physical therapy evaluation and treatment for constipation. Patient started with constipation issues in August when he was under stress and not able to have a bowel movement for 5 days. He started to have increased in rectal pain with bowel movements at level 5/10 and needed to focus hard on what to do to have a bowel movement. Patient reports his stool is ribbon shape at this time. He is using his dilators and stretching his hips. Patient right rotation in lumbar causes groin pain in the right. Passive ROM of hips causes groin pain and he has limited motion. Pelvic floor strength is 3/5 in rectum. The puborectalis has difficulty with relaxing. He has tightness in the left iliococcygeus and obturator internist. He has tenderness located in the right puborectalis. Patient will benefit from skilled therapy to improve pelvic floor muscle mobility and reduce constipation.    OBJECTIVE IMPAIRMENTS: decreased coordination, decreased ROM, decreased strength, increased fascial restrictions, increased muscle spasms, impaired  tone, and pain.   ACTIVITY LIMITATIONS: toileting  PARTICIPATION LIMITATIONS: occupation  PERSONAL FACTORS: Time since onset of injury/illness/exacerbation and 1-2 comorbidities: Diverticulitis; IBS; Appendectomy  are also affecting patient's functional outcome.   REHAB POTENTIAL: Excellent  CLINICAL DECISION MAKING: Stable/uncomplicated  EVALUATION COMPLEXITY: Low   GOALS: Goals reviewed with patient? Yes  SHORT TERM GOALS: Target date: 04/01/23  Patient understands how to use the anal wand to reduce trigger points in the pelvic floor  Baseline: Goal status: INITIAL  2.  Patient is independent with hip stretches to elongate the pelvic floor.and the hip capsule.  Baseline:  Goal status: INITIAL   LONG TERM GOALS: Target date: 06/25/23  Patient is independent with HEP for flexibility and coordination of the pelvic floor  Baseline:  Goal status: INITIAL  2.  Patient stool diameter is the size of nickel or bigger due to elongation of the anal canal and relaxation of the puborectalis.  Baseline:  Goal status: INITIAL  3.  Sphincter strength is 4/5 and has the ability to fully relax the puborectalis.  Baseline:  Goal status: INITIAL  4.  Patient has a strong and full urine stream due to the ability to relax his pelvic floor.  Baseline:  Goal status: INITIAL  5.  Patient feels he is able to fully empty his rectum when having a bowel movement due to the puborectalis muscle relaxing.  Baseline:  Goal status: INITIAL     PLAN:  PT FREQUENCY: 1x/week  PT  DURATION: other: 4 months  PLANNED INTERVENTIONS: Therapeutic exercises, Therapeutic activity, Neuromuscular re-education, Patient/Family education, Joint mobilization, Dry Needling, Electrical stimulation, Spinal mobilization, Cryotherapy, Moist heat, Ultrasound, Biofeedback, and Manual therapy  PLAN FOR NEXT SESSION: dry needling to the pelvic floor, manual work to the pelvic floor, manual work to the perineal  body, RUSI for pelvic drop; mobilization to bilateral hips   Eulis Foster, PT 03/05/23 10:38 AM

## 2023-03-05 ENCOUNTER — Encounter: Payer: Self-pay | Admitting: Physical Therapy

## 2023-03-05 ENCOUNTER — Ambulatory Visit: Payer: Commercial Managed Care - PPO | Attending: Gastroenterology | Admitting: Physical Therapy

## 2023-03-05 DIAGNOSIS — M6281 Muscle weakness (generalized): Secondary | ICD-10-CM | POA: Insufficient documentation

## 2023-03-05 DIAGNOSIS — K5902 Outlet dysfunction constipation: Secondary | ICD-10-CM | POA: Insufficient documentation

## 2023-03-05 DIAGNOSIS — R278 Other lack of coordination: Secondary | ICD-10-CM | POA: Insufficient documentation

## 2023-03-05 NOTE — Patient Instructions (Signed)

## 2023-04-14 ENCOUNTER — Ambulatory Visit: Payer: Commercial Managed Care - PPO | Attending: Gastroenterology | Admitting: Physical Therapy

## 2023-04-14 ENCOUNTER — Encounter: Payer: Self-pay | Admitting: Physical Therapy

## 2023-04-14 DIAGNOSIS — K5902 Outlet dysfunction constipation: Secondary | ICD-10-CM | POA: Diagnosis present

## 2023-04-14 DIAGNOSIS — R278 Other lack of coordination: Secondary | ICD-10-CM | POA: Diagnosis present

## 2023-04-14 DIAGNOSIS — M6281 Muscle weakness (generalized): Secondary | ICD-10-CM | POA: Diagnosis present

## 2023-04-14 NOTE — Therapy (Signed)
OUTPATIENT PHYSICAL THERAPY MALE PELVIC TREATMENT   Patient Name: Jacob Graham MRN: [id number] DOB:June 18, 1971, 52 y.o., male Today's Date: 04/14/2023  END OF SESSION:  PT End of Session - 04/14/23 1533     Visit Number 2    Date for PT Re-Evaluation 06/25/23    Authorization Type [employer]    PT Start Time 1530    PT Stop Time 1610    PT Time Calculation (min) 40 min    Activity Tolerance Patient tolerated treatment well    Behavior During Therapy WFL for tasks assessed/performed             Past Medical History:  Diagnosis Date   Asthma    Diverticulitis    IBS (irritable bowel syndrome)    Rectal bleeding    Wears glasses    Past Surgical History:  Procedure Laterality Date   ANAL RECTAL MANOMETRY N/A 07/24/2020   Procedure: ANO RECTAL MANOMETRY;  Surgeon: Napoleon Form, MD;  Location: [facility];  Service: Endoscopy;  Laterality: N/A;   APPENDECTOMY  1999   colonoscopy  2008, 2014   CYSTOSCOPY WITH RETROGRADE PYELOGRAM, URETEROSCOPY AND STENT PLACEMENT Bilateral 10/25/2019   Procedure: CYSTOSCOPY WITH BILATERAL RETROGRADE, FIREFLY INJECTION  AND URETERAL CATHETER PLACEMENT;  Surgeon: Sebastian Ache, MD;  Location: [facility];  Service: Urology;  Laterality: Bilateral;   EVALUATION UNDER ANESTHESIA WITH FISTULECTOMY N/A 10/09/2015   Procedure: EXAM UNDER ANESTHESIA; FISTULOTOMY;  Surgeon: Romie Levee, MD;  Location: [facility];  Service: General;  Laterality: N/A;   KNEE SURGERY  1993   Patient Active Problem List   Diagnosis Date Noted   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    Dyssynergic defecation    Diverticular disease 10/25/2019   Wears glasses    IBS (irritable bowel syndrome)    Rectal bleeding    Diverticulitis    Asthma     PCP: [facility]  REFERRING PROVIDER: Napoleon Form, MD   REFERRING DIAG: [phone] (ICD-10-CM) - Dyssynergic defecation   THERAPY DIAG:  Other lack of coordination  Dyssynergic  defecation  Rationale for Evaluation and Treatment: Rehabilitation  ONSET DATE: 02/23/23  SUBJECTIVE:                                                                                                                                                                                           SUBJECTIVE STATEMENT: I am able to go a  little easier. I have ribbon stool. I am not fully emptying. I may have some leakage on the rectum and need to wipe up. Bowel movement daily that is a small amount. Trying  to use the dilator every other day. Patient is on the middle pack.  Fluid intake: water, liquid IV  PAIN:  Are you having pain? Yes NPRS scale: 6/10 Pain location: rectum Pain type: sharp Pain description: intermittent   Aggravating factors: while having a bowel movement and using the dilators Relieving factors: not having a bowel movement  PRECAUTIONS: None  RED FLAGS: None   WEIGHT BEARING RESTRICTIONS: No  FALLS:  Has patient fallen in last 6 months? Yes. Number of falls fell at work with a full back pack and was going too fast but not due to balance  LIVING ENVIRONMENT: Lives with: lives with their spouse  OCCUPATION: sitting  PLOF: Independent  PATIENT GOALS: reduce tension in the anus  PERTINENT HISTORY:  Diverticulitis; IBS; Appendectomy;    BOWEL MOVEMENT: Pain with bowel movement: Yes  Type of bowel movement:Type (Bristol Stool Scale) ribbon short stool, Frequency every 2-3 days, and Strain No, needs to focus on bowel movements to use the correct technique Fully empty rectum: No Leakage: Yes: after a bowel movement Pads: No Fiber supplement: Yes: Miralax  URINATION: Pain with urination: No Fully empty bladder: Yes: times he does not feel like he has to urinate after drinking a lot of water, decreased sensation Stream:  has to work at his stream Urgency: No, sit to stand at work will get sensation to have to urinate Frequency: average Leakage:   none  INTERCOURSE:no issues    OBJECTIVE:   DIAGNOSTIC FINDINGS:  none   COGNITION: Overall cognitive status: Within functional limits for tasks assessed     SENSATION: Light touch: Appears intact Proprioception: Appears intact     POSTURE: No Significant postural limitations  PELVIC ALIGNMENT:  LUMBARAROM/PROM:  A/PROM A/PROM  eval  Right rotation Decreased by 25% with pain in right groin   (Blank rows = not tested)  LOWER EXTREMITY AROM/PROM:  P/PROM Right eval Left eval  Hip flexion Full with pain in left groin Full with pain in left groin  Hip abduction 25 25 with pain in groin  Hip internal rotation 10 with pain in groin 20 with pain in groin  Hip external rotation 45 50   (Blank rows = not tested)  LOWER EXTREMITY MMT:  MMT Right eval Left eval  Hip abduction 5/5 4/5   PALPATION:             External Perineal Exam able to pucker the anus              Internal Pelvic Floor the puborectalis is not able to relax, tenderness located in the right side of the puborectalis, tightness in left puborectalis, iliococcygeus and obturator internist Patient confirms identification and approves PT to assess internal pelvic floor and treatment Yes  PELVIC MMT:   MMT eval  Internal Anal Sphincter 3/5  External Anal Sphincter 3/5  Puborectalis 4/5  (Blank rows = not tested)  TONE: increased  TODAY'S TREATMENT:     04/14/23 Manual: Soft tissue mobilization:  To assess for dry needling Manual work to the perineal body, right ischiocavernosus, superior transverse, external anal sphincter, coccygeus and puborectalis Trigger Point Dry-Needling  Treatment instructions: Expect mild to moderate muscle soreness. S/S of pneumothorax if dry needled over a lung field, and to seek immediate medical attention should they occur. Patient verbalized understanding of these instructions and education.  Patient Consent Given: Yes Education handout provided: Previously  provided Muscles treated: perineal body, right ischiocavernosus, right superior transverse, right puborectalis, right coccygeus, right external  anal sphincter Electrical stimulation performed: No Parameters: N/A Treatment response/outcome: elongation of muscle and trigger point response  Neuromuscular re-education: Down training: Diaphragmatic breathing while therapist feels the pelvic floor bulge Keeping pelvic floor bulge as he breathes out into fist and keeping the bulge to facilitate the pushing of bowel movement                                                                                                                             DATE: 03/05/23  EVAL Trigger Point Dry-Needling  Treatment instructions: Expect mild to moderate muscle soreness. S/S of pneumothorax if dry needled over a lung field, and to seek immediate medical attention should they occur. Patient verbalized understanding of these instructions and education.  Patient Consent Given: Yes Education handout provided: Yes Muscles treated: puborectalis, iliococcygeus Electrical stimulation performed: No Parameters: N/A Treatment response/outcome: elongation of muscle and trigger point response Manual work to the muscles to work on elongation after dry needling RUSI Using the RUSI with transperineal setting at the perineal body working on relaxation of the pelvic floor     PATIENT EDUCATION:  03/05/23 Education details: how to relax the pelvic floor, using his dilator for 10 minutes, information on dry needling Person educated: Patient Education method: Explanation, Demonstration, Tactile cues, Verbal cues, and Handouts Education comprehension: verbalized understanding, returned demonstration, verbal cues required, tactile cues required, and needs further education  HOME EXERCISE PROGRAM: See above.   ASSESSMENT:  CLINICAL IMPRESSION: Patient is a 52 y.o. male who was seen today for physical therapy treatment  for constipation. Patient was able to bulge his pelvic floor after the dry needling. He had multiple trigger points in the right pelvic floor. He is able to keep the pelvic floor bulge as he breaths out after the dry needling. Patient will benefit from skilled therapy to improve pelvic floor muscle mobility and reduce constipation.    OBJECTIVE IMPAIRMENTS: decreased coordination, decreased ROM, decreased strength, increased fascial restrictions, increased muscle spasms, impaired tone, and pain.   ACTIVITY LIMITATIONS: toileting  PARTICIPATION LIMITATIONS: occupation  PERSONAL FACTORS: Time since onset of injury/illness/exacerbation and 1-2 comorbidities: Diverticulitis; IBS; Appendectomy  are also affecting patient's functional outcome.   REHAB POTENTIAL: Excellent  CLINICAL DECISION MAKING: Stable/uncomplicated  EVALUATION COMPLEXITY: Low   GOALS: Goals reviewed with patient? Yes  SHORT TERM GOALS: Target date: 04/01/23  Patient understands how to use the anal wand to reduce trigger points in the pelvic floor  Baseline: Goal status: INITIAL  2.  Patient is independent with hip stretches to elongate the pelvic floor.and the hip capsule.  Baseline:  Goal status: INITIAL   LONG TERM GOALS: Target date: 06/25/23  Patient is independent with HEP for flexibility and coordination of the pelvic floor  Baseline:  Goal status: INITIAL  2.  Patient stool diameter is the size of nickel or bigger due to elongation of the anal canal and relaxation of the puborectalis.  Baseline:  Goal status: INITIAL  3.  Sphincter strength is 4/5 and has the ability to fully relax the puborectalis.  Baseline:  Goal status: INITIAL  4.  Patient has a strong and full urine stream due to the ability to relax his pelvic floor.  Baseline:  Goal status: INITIAL  5.  Patient feels he is able to fully empty his rectum when having a bowel movement due to the puborectalis muscle relaxing.  Baseline:   Goal status: INITIAL     PLAN:  PT FREQUENCY: 1x/week  PT DURATION: other: 4 months  PLANNED INTERVENTIONS: Therapeutic exercises, Therapeutic activity, Neuromuscular re-education, Patient/Family education, Joint mobilization, Dry Needling, Electrical stimulation, Spinal mobilization, Cryotherapy, Moist heat, Ultrasound, Biofeedback, and Manual therapy  PLAN FOR NEXT SESSION: dry needling to the pelvic floor, manual work to the pelvic floor, manual work to the perineal body, RUSI for pelvic drop; mobilization to bilateral hips, review hip stretches, go over how to use the pelvic wand   Eulis Foster, PT 04/14/23 4:14 PM

## 2023-04-21 ENCOUNTER — Ambulatory Visit: Payer: Commercial Managed Care - PPO | Admitting: Physical Therapy

## 2023-04-21 ENCOUNTER — Encounter: Payer: Self-pay | Admitting: Physical Therapy

## 2023-04-21 DIAGNOSIS — M6281 Muscle weakness (generalized): Secondary | ICD-10-CM

## 2023-04-21 DIAGNOSIS — R278 Other lack of coordination: Secondary | ICD-10-CM

## 2023-04-21 DIAGNOSIS — K5902 Outlet dysfunction constipation: Secondary | ICD-10-CM

## 2023-04-21 NOTE — Therapy (Signed)
OUTPATIENT PHYSICAL THERAPY MALE PELVIC TREATMENT   Patient Name: Jacob Graham MRN: [id number] DOB:18-Jul-1970, 52 y.o., male Today's Date: 04/21/2023  END OF SESSION:  PT End of Session - 04/21/23 1231     Visit Number 3    Date for PT Re-Evaluation 06/25/23    Authorization Type [employer]    PT Start Time 1230    PT Stop Time 1310    PT Time Calculation (min) 40 min    Activity Tolerance Patient tolerated treatment well    Behavior During Therapy WFL for tasks assessed/performed             Past Medical History:  Diagnosis Date   Asthma    Diverticulitis    IBS (irritable bowel syndrome)    Rectal bleeding    Wears glasses    Past Surgical History:  Procedure Laterality Date   ANAL RECTAL MANOMETRY N/A 07/24/2020   Procedure: ANO RECTAL MANOMETRY;  Surgeon: Napoleon Form, MD;  Location: [facility];  Service: Endoscopy;  Laterality: N/A;   APPENDECTOMY  1999   colonoscopy  2008, 2014   CYSTOSCOPY WITH RETROGRADE PYELOGRAM, URETEROSCOPY AND STENT PLACEMENT Bilateral 10/25/2019   Procedure: CYSTOSCOPY WITH BILATERAL RETROGRADE, FIREFLY INJECTION  AND URETERAL CATHETER PLACEMENT;  Surgeon: Sebastian Ache, MD;  Location: [facility];  Service: Urology;  Laterality: Bilateral;   EVALUATION UNDER ANESTHESIA WITH FISTULECTOMY N/A 10/09/2015   Procedure: EXAM UNDER ANESTHESIA; FISTULOTOMY;  Surgeon: Romie Levee, MD;  Location: [facility];  Service: General;  Laterality: N/A;   KNEE SURGERY  1993   Patient Active Problem List   Diagnosis Date Noted   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    Dyssynergic defecation    Diverticular disease 10/25/2019   Wears glasses    IBS (irritable bowel syndrome)    Rectal bleeding    Diverticulitis    Asthma     PCP: [facility]  REFERRING PROVIDER: Napoleon Form, MD   REFERRING DIAG: [phone] (ICD-10-CM) - Dyssynergic defecation   THERAPY DIAG:  Other lack of coordination  Dyssynergic  defecation  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  Muscle weakness (generalized)  Rationale for Evaluation and Treatment: Rehabilitation  ONSET DATE: 02/23/23  SUBJECTIVE:                                                                                                                                                                                           SUBJECTIVE STATEMENT: My bottom is hurting. I had trouble with emptying my bowels.    PAIN:  Are you having pain? Yes NPRS scale: 5/10 Pain location: rectum Pain type: sharp  Pain description: intermittent   Aggravating factors: while having a bowel movement and using the dilators Relieving factors: not having a bowel movement  PRECAUTIONS: None  RED FLAGS: None   WEIGHT BEARING RESTRICTIONS: No  FALLS:  Has patient fallen in last 6 months? Yes. Number of falls fell at work with a full back pack and was going too fast but not due to balance  LIVING ENVIRONMENT: Lives with: lives with their spouse  OCCUPATION: sitting  PLOF: Independent  PATIENT GOALS: reduce tension in the anus  PERTINENT HISTORY:  Diverticulitis; IBS; Appendectomy;    BOWEL MOVEMENT: Pain with bowel movement: Yes  Type of bowel movement:Type (Bristol Stool Scale) ribbon short stool, Frequency every 2-3 days, and Strain No, needs to focus on bowel movements to use the correct technique Fully empty rectum: No Leakage: Yes: after a bowel movement Pads: No Fiber supplement: Yes: Miralax  URINATION: Pain with urination: No Fully empty bladder: Yes: times he does not feel like he has to urinate after drinking a lot of water, decreased sensation Stream:  has to work at his stream Urgency: No, sit to stand at work will get sensation to have to urinate Frequency: average Leakage:  none  INTERCOURSE:no issues    OBJECTIVE:   DIAGNOSTIC FINDINGS:  none   COGNITION: Overall cognitive status: Within functional limits for tasks  assessed     SENSATION: Light touch: Appears intact Proprioception: Appears intact     POSTURE: No Significant postural limitations  PELVIC ALIGNMENT:  LUMBARAROM/PROM:  A/PROM A/PROM  eval  Right rotation Decreased by 25% with pain in right groin   (Blank rows = not tested)  LOWER EXTREMITY AROM/PROM:  P/PROM Right eval Left eval  Hip flexion Full with pain in left groin Full with pain in left groin  Hip abduction 25 25 with pain in groin  Hip internal rotation 10 with pain in groin 20 with pain in groin  Hip external rotation 45 50   (Blank rows = not tested)  LOWER EXTREMITY MMT:  MMT Right eval Left eval  Hip abduction 5/5 4/5   PALPATION:             External Perineal Exam able to pucker the anus              Internal Pelvic Floor the puborectalis is not able to relax, tenderness located in the right side of the puborectalis, tightness in left puborectalis, iliococcygeus and obturator internist Patient confirms identification and approves PT to assess internal pelvic floor and treatment Yes  PELVIC MMT:   MMT eval  Internal Anal Sphincter 3/5  External Anal Sphincter 3/5  Puborectalis 4/5  (Blank rows = not tested)  TONE: increased  TODAY'S TREATMENT:     04/21/23 Manual: Soft tissue mobilization: To assess for dry needling Manual work to the levator ani, perineal body, puborectalis, ischiocavernosus to elongate after dry needling Trigger Point Dry-Needling  Treatment instructions: Expect mild to moderate muscle soreness. S/S of pneumothorax if dry needled over a lung field, and to seek immediate medical attention should they occur. Patient verbalized understanding of these instructions and education.  Patient Consent Given: Yes Education handout provided: Previously provided Muscles treated: perineal body, left ischiocavernosus, left superior transverse, left puborectalis, left coccygeus, left external anal sphincter Electrical stimulation  performed: No Parameters: N/A Treatment response/outcome: elongation of muscle and trigger point response  Exercises: Stretches/mobility: Squat stretch holding 15 sec Hip adductor stretch in squat holding for 30 sec each Sitting with hip  internal rotation to stretch the pelvic floor    04/14/23 Manual: Soft tissue mobilization:  To assess for dry needling Manual work to the perineal body, right ischiocavernosus, superior transverse, external anal sphincter, coccygeus and puborectalis Trigger Point Dry-Needling  Treatment instructions: Expect mild to moderate muscle soreness. S/S of pneumothorax if dry needled over a lung field, and to seek immediate medical attention should they occur. Patient verbalized understanding of these instructions and education.  Patient Consent Given: Yes Education handout provided: Previously provided Muscles treated: perineal body, right ischiocavernosus, right superior transverse, right puborectalis, right coccygeus, right external anal sphincter Electrical stimulation performed: No Parameters: N/A Treatment response/outcome: elongation of muscle and trigger point response  Neuromuscular re-education: Down training: Diaphragmatic breathing while therapist feels the pelvic floor bulge Keeping pelvic floor bulge as he breathes out into fist and keeping the bulge to facilitate the pushing of bowel movement                                                                                                                             DATE: 03/05/23  EVAL Trigger Point Dry-Needling  Treatment instructions: Expect mild to moderate muscle soreness. S/S of pneumothorax if dry needled over a lung field, and to seek immediate medical attention should they occur. Patient verbalized understanding of these instructions and education.  Patient Consent Given: Yes Education handout provided: Yes Muscles treated: puborectalis, iliococcygeus Electrical stimulation  performed: No Parameters: N/A Treatment response/outcome: elongation of muscle and trigger point response Manual work to the muscles to work on elongation after dry needling RUSI Using the RUSI with transperineal setting at the perineal body working on relaxation of the pelvic floor     PATIENT EDUCATION:  03/05/23 Education details: how to relax the pelvic floor, using his dilator for 10 minutes, information on dry needling Person educated: Patient Education method: Explanation, Demonstration, Tactile cues, Verbal cues, and Handouts Education comprehension: verbalized understanding, returned demonstration, verbal cues required, tactile cues required, and needs further education  HOME EXERCISE PROGRAM: See above.   ASSESSMENT:  CLINICAL IMPRESSION: Patient is a 52 y.o. male who was seen today for physical therapy treatment for constipation. Patient had a week of difficulty with pushing stool out so he is sore internally today. Patient responded well with the dry needling and the muscles softened. He is using his rectal wand internally at home. He is using his rectal dilators too.  Patient will benefit from skilled therapy to improve pelvic floor muscle mobility and reduce constipation.    OBJECTIVE IMPAIRMENTS: decreased coordination, decreased ROM, decreased strength, increased fascial restrictions, increased muscle spasms, impaired tone, and pain.   ACTIVITY LIMITATIONS: toileting  PARTICIPATION LIMITATIONS: occupation  PERSONAL FACTORS: Time since onset of injury/illness/exacerbation and 1-2 comorbidities: Diverticulitis; IBS; Appendectomy  are also affecting patient's functional outcome.   REHAB POTENTIAL: Excellent  CLINICAL DECISION MAKING: Stable/uncomplicated  EVALUATION COMPLEXITY: Low   GOALS: Goals reviewed with patient? Yes  SHORT TERM GOALS: Target date:  04/01/23  Patient understands how to use the anal wand to reduce trigger points in the pelvic floor   Baseline: Goal status: Met 04/21/23  2.  Patient is independent with hip stretches to elongate the pelvic floor.and the hip capsule.  Baseline:  Goal status: Met 04/21/23   LONG TERM GOALS: Target date: 06/25/23  Patient is independent with HEP for flexibility and coordination of the pelvic floor  Baseline:  Goal status: INITIAL  2.  Patient stool diameter is the size of nickel or bigger due to elongation of the anal canal and relaxation of the puborectalis.  Baseline:  Goal status: INITIAL  3.  Sphincter strength is 4/5 and has the ability to fully relax the puborectalis.  Baseline:  Goal status: INITIAL  4.  Patient has a strong and full urine stream due to the ability to relax his pelvic floor.  Baseline:  Goal status: INITIAL  5.  Patient feels he is able to fully empty his rectum when having a bowel movement due to the puborectalis muscle relaxing.  Baseline:  Goal status: INITIAL     PLAN:  PT FREQUENCY: 1x/week  PT DURATION: other: 4 months  PLANNED INTERVENTIONS: Therapeutic exercises, Therapeutic activity, Neuromuscular re-education, Patient/Family education, Joint mobilization, Dry Needling, Electrical stimulation, Spinal mobilization, Cryotherapy, Moist heat, Ultrasound, Biofeedback, and Manual therapy  PLAN FOR NEXT SESSION: dry needling to the pelvic floor, manual work to the pelvic floor, manual work to the perineal body,  mobilization to bilateral hips, review hip stretches,   Eulis Foster, PT 04/21/23 1:09 PM

## 2023-04-28 ENCOUNTER — Encounter: Payer: Commercial Managed Care - PPO | Admitting: Physical Therapy

## 2023-05-05 ENCOUNTER — Encounter: Payer: Commercial Managed Care - PPO | Admitting: Physical Therapy

## 2023-05-12 ENCOUNTER — Ambulatory Visit: Payer: Commercial Managed Care - PPO | Attending: Gastroenterology | Admitting: Physical Therapy

## 2023-05-12 ENCOUNTER — Encounter: Payer: Self-pay | Admitting: Physical Therapy

## 2023-05-12 DIAGNOSIS — R278 Other lack of coordination: Secondary | ICD-10-CM | POA: Insufficient documentation

## 2023-05-12 DIAGNOSIS — K5902 Outlet dysfunction constipation: Secondary | ICD-10-CM | POA: Insufficient documentation

## 2023-05-12 DIAGNOSIS — M6281 Muscle weakness (generalized): Secondary | ICD-10-CM | POA: Diagnosis present

## 2023-05-12 NOTE — Therapy (Signed)
OUTPATIENT PHYSICAL THERAPY MALE PELVIC TREATMENT   Patient Name: Jacob Graham MRN: [id number] DOB:May 22, 1971, 52 y.o., male Today's Date: 05/12/2023  END OF SESSION:  PT End of Session - 05/12/23 1603     Visit Number 4    Date for PT Re-Evaluation 06/25/23    Authorization Type [employer]    PT Start Time 1605    PT Stop Time 1645    PT Time Calculation (min) 40 min    Activity Tolerance Patient tolerated treatment well    Behavior During Therapy WFL for tasks assessed/performed             Past Medical History:  Diagnosis Date   Asthma    Diverticulitis    IBS (irritable bowel syndrome)    Rectal bleeding    Wears glasses    Past Surgical History:  Procedure Laterality Date   ANAL RECTAL MANOMETRY N/A 07/24/2020   Procedure: ANO RECTAL MANOMETRY;  Surgeon: Napoleon Form, MD;  Location: [facility];  Service: Endoscopy;  Laterality: N/A;   APPENDECTOMY  1999   colonoscopy  2008, 2014   CYSTOSCOPY WITH RETROGRADE PYELOGRAM, URETEROSCOPY AND STENT PLACEMENT Bilateral 10/25/2019   Procedure: CYSTOSCOPY WITH BILATERAL RETROGRADE, FIREFLY INJECTION  AND URETERAL CATHETER PLACEMENT;  Surgeon: Sebastian Ache, MD;  Location: [facility];  Service: Urology;  Laterality: Bilateral;   EVALUATION UNDER ANESTHESIA WITH FISTULECTOMY N/A 10/09/2015   Procedure: EXAM UNDER ANESTHESIA; FISTULOTOMY;  Surgeon: Romie Levee, MD;  Location: [facility];  Service: General;  Laterality: N/A;   KNEE SURGERY  1993   Patient Active Problem List   Diagnosis Date Noted   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    Dyssynergic defecation    Diverticular disease 10/25/2019   Wears glasses    IBS (irritable bowel syndrome)    Rectal bleeding    Diverticulitis    Asthma     PCP: [facility]  REFERRING PROVIDER: Napoleon Form, MD   REFERRING DIAG: [phone] (ICD-10-CM) - Dyssynergic defecation   THERAPY DIAG:  Other lack of coordination  Dyssynergic  defecation  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  Muscle weakness (generalized)  Rationale for Evaluation and Treatment: Rehabilitation  ONSET DATE: 02/23/23  SUBJECTIVE:                                                                                                                                                                                           SUBJECTIVE STATEMENT: I have not had a good week. My bottom feels very swollen and tight. I have been doing the dilators. No pain with the dilators. I am able to use  the biggest dilators.  I have not been not straining. I am having trouble with bowel movements. I have little nuggets coming out. I get the urge to have a bowel movement but only gas will come out. I have had a lot of stress but dealing with it. No pain with bowel movement.    PAIN:  Are you having pain? Yes NPRS scale: 5/10 Pain location: rectum Pain type: sharp Pain description: intermittent   Aggravating factors: while having a bowel movement and using the dilators Relieving factors: not having a bowel movement  PRECAUTIONS: None  RED FLAGS: None   WEIGHT BEARING RESTRICTIONS: No  FALLS:  Has patient fallen in last 6 months? Yes. Number of falls fell at work with a full back pack and was going too fast but not due to balance  LIVING ENVIRONMENT: Lives with: lives with their spouse  OCCUPATION: sitting  PLOF: Independent  PATIENT GOALS: reduce tension in the anus  PERTINENT HISTORY:  Diverticulitis; IBS; Appendectomy;    BOWEL MOVEMENT: Pain with bowel movement: Yes  Type of bowel movement:Type (Bristol Stool Scale) ribbon short stool, Frequency every 2-3 days, and Strain No, needs to focus on bowel movements to use the correct technique Fully empty rectum: No Leakage: Yes: after a bowel movement Pads: No Fiber supplement: Yes: Miralax  URINATION: Pain with urination: No Fully empty bladder: Yes: times he does not feel like he has to urinate  after drinking a lot of water, decreased sensation Stream:  has to work at his stream Urgency: No, sit to stand at work will get sensation to have to urinate Frequency: average Leakage:  none  INTERCOURSE:no issues    OBJECTIVE:   DIAGNOSTIC FINDINGS:  none   COGNITION: Overall cognitive status: Within functional limits for tasks assessed     SENSATION: Light touch: Appears intact Proprioception: Appears intact     POSTURE: No Significant postural limitations  PELVIC ALIGNMENT:  LUMBARAROM/PROM:  A/PROM A/PROM  eval  Right rotation Decreased by 25% with pain in right groin   (Blank rows = not tested)  LOWER EXTREMITY AROM/PROM:  P/PROM Right eval Left eval  Hip flexion Full with pain in left groin Full with pain in left groin  Hip abduction 25 25 with pain in groin  Hip internal rotation 10 with pain in groin 20 with pain in groin  Hip external rotation 45 50   (Blank rows = not tested)  LOWER EXTREMITY MMT:  MMT Right eval Left eval  Hip abduction 5/5 4/5   PALPATION:             External Perineal Exam able to pucker the anus              Internal Pelvic Floor the puborectalis is not able to relax, tenderness located in the right side of the puborectalis, tightness in left puborectalis, iliococcygeus and obturator internist Patient confirms identification and approves PT to assess internal pelvic floor and treatment Yes  PELVIC MMT:   MMT eval 05/12/23  Internal Anal Sphincter 3/5 4/5  External Anal Sphincter 3/5 4/5  Puborectalis 4/5 4/5  (Blank rows = not tested)  TONE: increased  TODAY'S TREATMENT:    05/12/23 Manual: Soft tissue mobilization: Manual work to the perineal body, along the levator ani, along the outside of the anus Internal pelvic floor techniques: No emotional/communication barriers or cognitive limitation. Patient is motivated to learn. Patient understands and agrees with treatment goals and plan. PT explains patient will  be examined  in standing, sitting, and lying down to see how their muscles and joints work. When they are ready, they will be asked to remove their underwear so PT can examine their perineum. The patient is also given the option of providing their own chaperone as one is not provided in our facility. The patient also has the right and is explained the right to defer or refuse any part of the evaluation or treatment including the internal exam. With the patient's consent, PT will use one gloved finger to gently assess the muscles of the pelvic floor, seeing how well it contracts and relaxes and if there is muscle symmetry. After, the patient will get dressed and PT and patient will discuss exam findings and plan of care. PT and patient discuss plan of care, schedule, attendance policy and HEP activities.  Going through the rectum working on the posterior rectum, along the puborectalis and anococcygeal ligament to elongate the tissue Neuromuscular re-education: Pelvic floor contraction training: Therapist finger in the rectum working on having the patient push the therapist finger out of the rectum by blowing out and pushing at the same time     04/21/23 Manual: Soft tissue mobilization: To assess for dry needling Manual work to the levator ani, perineal body, puborectalis, ischiocavernosus to elongate after dry needling Trigger Point Dry-Needling  Treatment instructions: Expect mild to moderate muscle soreness. S/S of pneumothorax if dry needled over a lung field, and to seek immediate medical attention should they occur. Patient verbalized understanding of these instructions and education.  Patient Consent Given: Yes Education handout provided: Previously provided Muscles treated: perineal body, left ischiocavernosus, left superior transverse, left puborectalis, left coccygeus, left external anal sphincter Electrical stimulation performed: No Parameters: N/A Treatment response/outcome: elongation  of muscle and trigger point response  Exercises: Stretches/mobility: Squat stretch holding 15 sec Hip adductor stretch in squat holding for 30 sec each Sitting with hip internal rotation to stretch the pelvic floor    04/14/23 Manual: Soft tissue mobilization:  To assess for dry needling Manual work to the perineal body, right ischiocavernosus, superior transverse, external anal sphincter, coccygeus and puborectalis Trigger Point Dry-Needling  Treatment instructions: Expect mild to moderate muscle soreness. S/S of pneumothorax if dry needled over a lung field, and to seek immediate medical attention should they occur. Patient verbalized understanding of these instructions and education.  Patient Consent Given: Yes Education handout provided: Previously provided Muscles treated: perineal body, right ischiocavernosus, right superior transverse, right puborectalis, right coccygeus, right external anal sphincter Electrical stimulation performed: No Parameters: N/A Treatment response/outcome: elongation of muscle and trigger point response  Neuromuscular re-education: Down training: Diaphragmatic breathing while therapist feels the pelvic floor bulge Keeping pelvic floor bulge as he breathes out into fist and keeping the bulge to facilitate the pushing of bowel movement  PATIENT EDUCATION:  03/05/23 Education details: how to relax the pelvic floor, using his dilator for 10 minutes, information on dry needling Person educated: Patient Education method: Explanation, Demonstration, Tactile cues, Verbal cues, and Handouts Education comprehension: verbalized understanding, returned demonstration, verbal cues required, tactile cues required, and needs further education  HOME EXERCISE PROGRAM: See above.   ASSESSMENT:  CLINICAL IMPRESSION: Patient is a 52 y.o. male  who was seen today for physical therapy treatment for constipation. Patient is not straining to have a bowel movement. He is having Type 1 bowel movements and feeling bloated. He is able to push the dilators out and is using the wand. The skin around the anus is soft with increased tissue protruding out. He has some restrictions in the posterior rectum and along the puborectals. Rectal strength is 4/5. He is able to contract the rectum and fully relax it.  Patient will benefit from skilled therapy to improve pelvic floor muscle mobility and reduce constipation.    OBJECTIVE IMPAIRMENTS: decreased coordination, decreased ROM, decreased strength, increased fascial restrictions, increased muscle spasms, impaired tone, and pain.   ACTIVITY LIMITATIONS: toileting  PARTICIPATION LIMITATIONS: occupation  PERSONAL FACTORS: Time since onset of injury/illness/exacerbation and 1-2 comorbidities: Diverticulitis; IBS; Appendectomy  are also affecting patient's functional outcome.   REHAB POTENTIAL: Excellent  CLINICAL DECISION MAKING: Stable/uncomplicated  EVALUATION COMPLEXITY: Low   GOALS: Goals reviewed with patient? Yes  SHORT TERM GOALS: Target date: 04/01/23  Patient understands how to use the anal wand to reduce trigger points in the pelvic floor  Baseline: Goal status: Met 04/21/23  2.  Patient is independent with hip stretches to elongate the pelvic floor.and the hip capsule.  Baseline:  Goal status: Met 04/21/23   LONG TERM GOALS: Target date: 06/25/23  Patient is independent with HEP for flexibility and coordination of the pelvic floor  Baseline:  Goal status: INITIAL  2.  Patient stool diameter is the size of nickel or bigger due to elongation of the anal canal and relaxation of the puborectalis.  Baseline:  Goal status: INITIAL  3.  Sphincter strength is 4/5 and has the ability to fully relax the puborectalis.  Baseline:  Goal status: Met 05/12/23  4.  Patient has a strong  and full urine stream due to the ability to relax his pelvic floor.  Baseline:  Goal status: INITIAL  5.  Patient feels he is able to fully empty his rectum when having a bowel movement due to the puborectalis muscle relaxing.  Baseline:  Goal status: INITIAL     PLAN:  PT FREQUENCY: 1x/week  PT DURATION: other: 4 months  PLANNED INTERVENTIONS: Therapeutic exercises, Therapeutic activity, Neuromuscular re-education, Patient/Family education, Joint mobilization, Dry Needling, Electrical stimulation, Spinal mobilization, Cryotherapy, Moist heat, Ultrasound, Biofeedback, and Manual therapy  PLAN FOR NEXT SESSION: dry needling to the pelvic floor, manual work to the pelvic floor, manual work to the perineal body,  mobilization to bilateral hips, review hip stretches,   Eulis Foster, PT 05/12/23 4:44 PM

## 2023-05-19 ENCOUNTER — Encounter: Payer: Self-pay | Admitting: Physical Therapy

## 2023-05-19 ENCOUNTER — Ambulatory Visit: Payer: Commercial Managed Care - PPO | Admitting: Physical Therapy

## 2023-05-19 DIAGNOSIS — K5902 Outlet dysfunction constipation: Secondary | ICD-10-CM

## 2023-05-19 DIAGNOSIS — M6281 Muscle weakness (generalized): Secondary | ICD-10-CM

## 2023-05-19 DIAGNOSIS — R278 Other lack of coordination: Secondary | ICD-10-CM

## 2023-05-19 NOTE — Therapy (Signed)
OUTPATIENT PHYSICAL THERAPY MALE PELVIC TREATMENT   Patient Name: Jacob Graham MRN: [id number] DOB:[birth year], 52 y.o., male Today's Date: 05/19/2023  END OF SESSION:  PT End of Session - 05/19/23 1618     Visit Number 5    Date for PT Re-Evaluation 06/25/23    Authorization Type [employer]    PT Start Time 1615    PT Stop Time 1655    PT Time Calculation (min) 40 min    Activity Tolerance Patient tolerated treatment well    Behavior During Therapy WFL for tasks assessed/performed             Past Medical History:  Diagnosis Date   Asthma    Diverticulitis    IBS (irritable bowel syndrome)    Rectal bleeding    Wears glasses    Past Surgical History:  Procedure Laterality Date   ANAL RECTAL MANOMETRY N/A 07/24/2020   Procedure: ANO RECTAL MANOMETRY;  Surgeon: Napoleon Form, MD;  Location: [facility];  Service: Endoscopy;  Laterality: N/A;   APPENDECTOMY  1999   colonoscopy  2008, 2014   CYSTOSCOPY WITH RETROGRADE PYELOGRAM, URETEROSCOPY AND STENT PLACEMENT Bilateral 10/25/2019   Procedure: CYSTOSCOPY WITH BILATERAL RETROGRADE, FIREFLY INJECTION  AND URETERAL CATHETER PLACEMENT;  Surgeon: Sebastian Ache, MD;  Location: [facility];  Service: Urology;  Laterality: Bilateral;   EVALUATION UNDER ANESTHESIA WITH FISTULECTOMY N/A 10/09/2015   Procedure: EXAM UNDER ANESTHESIA; FISTULOTOMY;  Surgeon: Romie Levee, MD;  Location: [facility];  Service: General;  Laterality: N/A;   KNEE SURGERY  1993   Patient Active Problem List   Diagnosis Date Noted   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    Dyssynergic defecation    Diverticular disease 10/25/2019   Wears glasses    IBS (irritable bowel syndrome)    Rectal bleeding    Diverticulitis    Asthma     PCP: [facility]  REFERRING PROVIDER: Napoleon Form, MD   REFERRING DIAG: [phone] (ICD-10-CM) - Dyssynergic defecation   THERAPY DIAG:  Other lack of coordination  Dyssynergic  defecation  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  Muscle weakness (generalized)  Rationale for Evaluation and Treatment: Rehabilitation  ONSET DATE: 02/23/23  SUBJECTIVE:                                                                                                                                                                                           SUBJECTIVE STATEMENT: I used the dilator and tight putting in and had a little difficulty to get out and had to squat.    PAIN:  Are you having pain? Yes  NPRS scale: 5/10 with inserts; 2/10 with bowel movements Pain location: rectum Pain type: sharp Pain description: intermittent   Aggravating factors: while having a bowel movement and using the dilators Relieving factors: not having a bowel movement  PRECAUTIONS: None  RED FLAGS: None   WEIGHT BEARING RESTRICTIONS: No  FALLS:  Has patient fallen in last 6 months? Yes. Number of falls fell at work with a full back pack and was going too fast but not due to balance  LIVING ENVIRONMENT: Lives with: lives with their spouse  OCCUPATION: sitting  PLOF: Independent  PATIENT GOALS: reduce tension in the anus  PERTINENT HISTORY:  Diverticulitis; IBS; Appendectomy;    BOWEL MOVEMENT: Pain with bowel movement: Yes  Type of bowel movement:Type (Bristol Stool Scale) ribbon short stool, Frequency every 2-3 days, and Strain No, needs to focus on bowel movements to use the correct technique Fully empty rectum: No Leakage: Yes: after a bowel movement Pads: No Fiber supplement: Yes: Miralax  URINATION: Pain with urination: No Fully empty bladder: Yes: times he does not feel like he has to urinate after drinking a lot of water, decreased sensation Stream:  has to work at his stream Urgency: No, sit to stand at work will get sensation to have to urinate Frequency: average Leakage:  none  INTERCOURSE:no issues    OBJECTIVE:   DIAGNOSTIC FINDINGS:   none   COGNITION: Overall cognitive status: Within functional limits for tasks assessed     SENSATION: Light touch: Appears intact Proprioception: Appears intact     POSTURE: No Significant postural limitations  PELVIC ALIGNMENT:  LUMBARAROM/PROM:  A/PROM A/PROM  eval  Right rotation Decreased by 25% with pain in right groin   (Blank rows = not tested)  LOWER EXTREMITY AROM/PROM:  P/PROM Right eval Left eval  Hip flexion Full with pain in left groin Full with pain in left groin  Hip abduction 25 25 with pain in groin  Hip internal rotation 10 with pain in groin 20 with pain in groin  Hip external rotation 45 50   (Blank rows = not tested)  LOWER EXTREMITY MMT:  MMT Right eval Left eval  Hip abduction 5/5 4/5   PALPATION:             External Perineal Exam able to pucker the anus              Internal Pelvic Floor the puborectalis is not able to relax, tenderness located in the right side of the puborectalis, tightness in left puborectalis, iliococcygeus and obturator internist Patient confirms identification and approves PT to assess internal pelvic floor and treatment Yes  PELVIC MMT:   MMT eval 05/12/23  Internal Anal Sphincter 3/5 4/5  External Anal Sphincter 3/5 4/5  Puborectalis 4/5 4/5  (Blank rows = not tested)  TONE: increased  TODAY'S TREATMENT:    05/19/23 Manual: Soft tissue mobilization: To assess for dry needling Manual work to the levator ani, perineal body, puborectalis, ischiocavernosus to elongate after dry needling Internal pelvic floor techniques: Going through the rectum to work on the puborectalis, external anal sphincter, obturator internist and iliococcygeus Trigger Point Dry-Needling  Treatment instructions: Expect mild to moderate muscle soreness. S/S of pneumothorax if dry needled over a lung field, and to seek immediate medical attention should they occur. Patient verbalized understanding of these instructions and  education.  Patient Consent Given: Yes Education handout provided: Previously provided Muscles treated: perineal body, bilateral  ischiocavernosus, bilateral superior transverse, left puborectalis, bilateral coccygeus, bilateral  external anal sphincter Electrical stimulation performed: No Parameters: N/A Treatment response/outcome: elongation of muscle and trigger point response Neuromuscular re-education: Down training: Laying on side and breathing  out with bearing down to facilitate pushing the stool out without contracting the pelvic floor    05/12/23 Manual: Soft tissue mobilization: Manual work to the perineal body, along the levator ani, along the outside of the anus Internal pelvic floor techniques: No emotional/communication barriers or cognitive limitation. Patient is motivated to learn. Patient understands and agrees with treatment goals and plan. PT explains patient will be examined in standing, sitting, and lying down to see how their muscles and joints work. When they are ready, they will be asked to remove their underwear so PT can examine their perineum. The patient is also given the option of providing their own chaperone as one is not provided in our facility. The patient also has the right and is explained the right to defer or refuse any part of the evaluation or treatment including the internal exam. With the patient's consent, PT will use one gloved finger to gently assess the muscles of the pelvic floor, seeing how well it contracts and relaxes and if there is muscle symmetry. After, the patient will get dressed and PT and patient will discuss exam findings and plan of care. PT and patient discuss plan of care, schedule, attendance policy and HEP activities.  Going through the rectum working on the posterior rectum, along the puborectalis and anococcygeal ligament to elongate the tissue Neuromuscular re-education: Pelvic floor contraction training: Therapist finger in the  rectum working on having the patient push the therapist finger out of the rectum by blowing out and pushing at the same time     04/21/23 Manual: Soft tissue mobilization: To assess for dry needling Manual work to the levator ani, perineal body, puborectalis, ischiocavernosus to elongate after dry needling Trigger Point Dry-Needling  Treatment instructions: Expect mild to moderate muscle soreness. S/S of pneumothorax if dry needled over a lung field, and to seek immediate medical attention should they occur. Patient verbalized understanding of these instructions and education.  Patient Consent Given: Yes Education handout provided: Previously provided Muscles treated: perineal body, left ischiocavernosus, left superior transverse, left puborectalis, left coccygeus, left external anal sphincter Electrical stimulation performed: No Parameters: N/A Treatment response/outcome: elongation of muscle and trigger point response  Exercises: Stretches/mobility: Squat stretch holding 15 sec Hip adductor stretch in squat holding for 30 sec each Sitting with hip internal rotation to stretch the pelvic floor                                                                                                                              PATIENT EDUCATION:  03/05/23 Education details: how to relax the pelvic floor, using his dilator for 10 minutes, information on dry needling Person educated: Patient Education method: Explanation, Demonstration, Tactile cues, Verbal cues, and Handouts Education comprehension: verbalized understanding, returned demonstration, verbal cues required, tactile  cues required, and needs further education  HOME EXERCISE PROGRAM: See above.   ASSESSMENT:  CLINICAL IMPRESSION: Patient is a 52 y.o. male who was seen today for physical therapy treatment for constipation. Patient had some trigger points in the rectum. Patient was contracting as he breaths out to have a bowel  movement and after verbal cues was able to correct this. Patient was having difficulty with pushing his dilator out. He stool are still skinny.  Patient will benefit from skilled therapy to improve pelvic floor muscle mobility and reduce constipation.    OBJECTIVE IMPAIRMENTS: decreased coordination, decreased ROM, decreased strength, increased fascial restrictions, increased muscle spasms, impaired tone, and pain.   ACTIVITY LIMITATIONS: toileting  PARTICIPATION LIMITATIONS: occupation  PERSONAL FACTORS: Time since onset of injury/illness/exacerbation and 1-2 comorbidities: Diverticulitis; IBS; Appendectomy  are also affecting patient's functional outcome.   REHAB POTENTIAL: Excellent  CLINICAL DECISION MAKING: Stable/uncomplicated  EVALUATION COMPLEXITY: Low   GOALS: Goals reviewed with patient? Yes  SHORT TERM GOALS: Target date: 04/01/23  Patient understands how to use the anal wand to reduce trigger points in the pelvic floor  Baseline: Goal status: Met 04/21/23  2.  Patient is independent with hip stretches to elongate the pelvic floor.and the hip capsule.  Baseline:  Goal status: Met 04/21/23   LONG TERM GOALS: Target date: 06/25/23  Patient is independent with HEP for flexibility and coordination of the pelvic floor  Baseline:  Goal status: INITIAL  2.  Patient stool diameter is the size of nickel or bigger due to elongation of the anal canal and relaxation of the puborectalis.  Baseline:  Goal status: INITIAL  3.  Sphincter strength is 4/5 and has the ability to fully relax the puborectalis.  Baseline:  Goal status: Met 05/12/23  4.  Patient has a strong and full urine stream due to the ability to relax his pelvic floor.  Baseline:  Goal status: INITIAL  5.  Patient feels he is able to fully empty his rectum when having a bowel movement due to the puborectalis muscle relaxing.  Baseline:  Goal status: INITIAL     PLAN:  PT FREQUENCY: 1x/week  PT  DURATION: other: 4 months  PLANNED INTERVENTIONS: Therapeutic exercises, Therapeutic activity, Neuromuscular re-education, Patient/Family education, Joint mobilization, Dry Needling, Electrical stimulation, Spinal mobilization, Cryotherapy, Moist heat, Ultrasound, Biofeedback, and Manual therapy  PLAN FOR NEXT SESSION: dry needling to the pelvic floor, manual work to the pelvic floor, manual work to the perineal body  Eulis Foster, PT 05/19/23 4:57 PM

## 2023-05-26 ENCOUNTER — Ambulatory Visit: Payer: Commercial Managed Care - PPO | Admitting: Physical Therapy

## 2023-05-26 ENCOUNTER — Encounter: Payer: Self-pay | Admitting: Physical Therapy

## 2023-05-26 DIAGNOSIS — R278 Other lack of coordination: Secondary | ICD-10-CM | POA: Diagnosis not present

## 2023-05-26 DIAGNOSIS — K5902 Outlet dysfunction constipation: Secondary | ICD-10-CM

## 2023-05-26 NOTE — Therapy (Signed)
OUTPATIENT PHYSICAL THERAPY MALE PELVIC TREATMENT   Patient Name: Jacob Graham MRN: [id number] DOB:[birth year], 52 y.o., male Today's Date: 05/26/2023  END OF SESSION:  PT End of Session - 05/26/23 1623     Visit Number 6    Date for PT Re-Evaluation 06/25/23    Authorization Type [employer]    PT Start Time 1615    PT Stop Time 1655    PT Time Calculation (min) 40 min    Activity Tolerance Patient tolerated treatment well    Behavior During Therapy WFL for tasks assessed/performed             Past Medical History:  Diagnosis Date   Asthma    Diverticulitis    IBS (irritable bowel syndrome)    Rectal bleeding    Wears glasses    Past Surgical History:  Procedure Laterality Date   ANAL RECTAL MANOMETRY N/A 07/24/2020   Procedure: ANO RECTAL MANOMETRY;  Surgeon: Napoleon Form, MD;  Location: [facility];  Service: Endoscopy;  Laterality: N/A;   APPENDECTOMY  1999   colonoscopy  2008, 2014   CYSTOSCOPY WITH RETROGRADE PYELOGRAM, URETEROSCOPY AND STENT PLACEMENT Bilateral 10/25/2019   Procedure: CYSTOSCOPY WITH BILATERAL RETROGRADE, FIREFLY INJECTION  AND URETERAL CATHETER PLACEMENT;  Surgeon: Sebastian Ache, MD;  Location: [facility];  Service: Urology;  Laterality: Bilateral;   EVALUATION UNDER ANESTHESIA WITH FISTULECTOMY N/A 10/09/2015   Procedure: EXAM UNDER ANESTHESIA; FISTULOTOMY;  Surgeon: Romie Levee, MD;  Location: [facility];  Service: General;  Laterality: N/A;   KNEE SURGERY  1993   Patient Active Problem List   Diagnosis Date Noted   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    Dyssynergic defecation    Diverticular disease 10/25/2019   Wears glasses    IBS (irritable bowel syndrome)    Rectal bleeding    Diverticulitis    Asthma     [facility]P: [facility]  REFERRING PROVIDER: Napoleon Form, MD   REFERRING DIAG: [phone] (ICD-10-CM) - Dyssynergic defecation   THERAPY DIAG:  Other lack of coordination  Dyssynergic  defecation  Rationale for Evaluation and Treatment: Rehabilitation  ONSET DATE: 02/23/23  SUBJECTIVE:                                                                                                                                                                                           SUBJECTIVE STATEMENT: I used the dilator and tight putting in and had a little difficulty to get out and had to squat.    PAIN:  Are you having pain? Yes NPRS scale: 5/10 with inserts; 2/10 with bowel movements Pain  location: rectum Pain type: sharp Pain description: intermittent   Aggravating factors: while having a bowel movement and using the dilators Relieving factors: not having a bowel movement  PRECAUTIONS: None  RED FLAGS: None   WEIGHT BEARING RESTRICTIONS: No  FALLS:  Has patient fallen in last 6 months? Yes. Number of falls fell at work with a full back pack and was going too fast but not due to balance  LIVING ENVIRONMENT: Lives with: lives with their spouse  OCCUPATION: sitting  PLOF: Independent  PATIENT GOALS: reduce tension in the anus  PERTINENT HISTORY:  Diverticulitis; IBS; Appendectomy;    BOWEL MOVEMENT: Pain with bowel movement: Yes  Type of bowel movement:Type (Bristol Stool Scale) ribbon short stool, Frequency every 2-3 days, and Strain No, needs to focus on bowel movements to use the correct technique Fully empty rectum: No Leakage: Yes: after a bowel movement Pads: No Fiber supplement: Yes: Miralax  URINATION: Pain with urination: No Fully empty bladder: Yes: times he does not feel like he has to urinate after drinking a lot of water, decreased sensation Stream:  has to work at his stream Urgency: No, sit to stand at work will get sensation to have to urinate Frequency: average Leakage:  none  INTERCOURSE:no issues    OBJECTIVE:   DIAGNOSTIC FINDINGS:  none   COGNITION: Overall cognitive status: Within functional limits for tasks  assessed     SENSATION: Light touch: Appears intact Proprioception: Appears intact     POSTURE: No Significant postural limitations  PELVIC ALIGNMENT:  LUMBARAROM/PROM:  A/PROM A/PROM  eval  Right rotation Decreased by 25% with pain in right groin   (Blank rows = not tested)  LOWER EXTREMITY AROM/PROM:  P/PROM Right eval Left eval  Hip flexion Full with pain in left groin Full with pain in left groin  Hip abduction 25 25 with pain in groin  Hip internal rotation 10 with pain in groin 20 with pain in groin  Hip external rotation 45 50   (Blank rows = not tested)  LOWER EXTREMITY MMT:  MMT Right eval Left eval  Hip abduction 5/5 4/5   PALPATION:             External Perineal Exam able to pucker the anus              Internal Pelvic Floor the puborectalis is not able to relax, tenderness located in the right side of the puborectalis, tightness in left puborectalis, iliococcygeus and obturator internist Patient confirms identification and approves PT to assess internal pelvic floor and treatment Yes  PELVIC MMT:   MMT eval 05/12/23  Internal Anal Sphincter 3/5 4/5  External Anal Sphincter 3/5 4/5  Puborectalis 4/5 4/5  (Blank rows = not tested)  TONE: increased  TODAY'S TREATMENT:    05/26/23 Exercises: Stretches/mobility: Mobilization of the diaphragm using the small ball in prone with ball under the diaphragm then lay on side to breath into the ball Sitting with hands into the diaphragm and bend forward to mobilize the diaphragm Discussed with patient on getting the last 2 larger dilators to use to expand the anal canal and not stop the stool in stead of the blow up catheter  Discussed with patient on using 2 stool to have him squat further to help with a full bowel movement Went over yoga videos for constipation and sent him ones that were the best.   05/19/23 Manual: Soft tissue mobilization: To assess for dry needling Manual work to the levator  ani, perineal body, puborectalis, ischiocavernosus to elongate after dry needling Internal pelvic floor techniques: Going through the rectum to work on the puborectalis, external anal sphincter, obturator internist and iliococcygeus Trigger Point Dry-Needling  Treatment instructions: Expect mild to moderate muscle soreness. S/S of pneumothorax if dry needled over a lung field, and to seek immediate medical attention should they occur. Patient verbalized understanding of these instructions and education.  Patient Consent Given: Yes Education handout provided: Previously provided Muscles treated: perineal body, bilateral  ischiocavernosus, bilateral superior transverse, left puborectalis, bilateral coccygeus, bilateral external anal sphincter Electrical stimulation performed: No Parameters: N/A Treatment response/outcome: elongation of muscle and trigger point response Neuromuscular re-education: Down training: Laying on side and breathing  out with bearing down to facilitate pushing the stool out without contracting the pelvic floor    05/12/23 Manual: Soft tissue mobilization: Manual work to the perineal body, along the levator ani, along the outside of the anus Internal pelvic floor techniques: No emotional/communication barriers or cognitive limitation. Patient is motivated to learn. Patient understands and agrees with treatment goals and plan. PT explains patient will be examined in standing, sitting, and lying down to see how their muscles and joints work. When they are ready, they will be asked to remove their underwear so PT can examine their perineum. The patient is also given the option of providing their own chaperone as one is not provided in our facility. The patient also has the right and is explained the right to defer or refuse any part of the evaluation or treatment including the internal exam. With the patient's consent, PT will use one gloved finger to gently assess the muscles  of the pelvic floor, seeing how well it contracts and relaxes and if there is muscle symmetry. After, the patient will get dressed and PT and patient will discuss exam findings and plan of care. PT and patient discuss plan of care, schedule, attendance policy and HEP activities.  Going through the rectum working on the posterior rectum, along the puborectalis and anococcygeal ligament to elongate the tissue Neuromuscular re-education: Pelvic floor contraction training: Therapist finger in the rectum working on having the patient push the therapist finger out of the rectum by blowing out and pushing at the same time                                                                                                                                PATIENT EDUCATION:  03/05/23 Education details: how to relax the pelvic floor, using his dilator for 10 minutes, information on dry needling Person educated: Patient Education method: Explanation, Demonstration, Tactile cues, Verbal cues, and Handouts Education comprehension: verbalized understanding, returned demonstration, verbal cues required, tactile cues required, and needs further education  HOME EXERCISE PROGRAM: See above.   ASSESSMENT:  CLINICAL IMPRESSION: Patient is a 52 y.o. male who was seen today for physical therapy treatment for constipation. Patient is able to push out the dilator  2 times but then has trouble the third time.   Patient would benefit from the larger dilators to stretch his anal canal further and work on push the larger part of the dilator out so the stool does not get stuck. Patient diaphragm is tight so it is helpful for him to mobilize it to relax his pelvic floor further. Patient will benefit from skilled therapy to improve pelvic floor muscle mobility and reduce constipation.    OBJECTIVE IMPAIRMENTS: decreased coordination, decreased ROM, decreased strength, increased fascial restrictions, increased muscle spasms,  impaired tone, and pain.   ACTIVITY LIMITATIONS: toileting  PARTICIPATION LIMITATIONS: occupation  PERSONAL FACTORS: Time since onset of injury/illness/exacerbation and 1-2 comorbidities: Diverticulitis; IBS; Appendectomy  are also affecting patient's functional outcome.   REHAB POTENTIAL: Excellent  CLINICAL DECISION MAKING: Stable/uncomplicated  EVALUATION COMPLEXITY: Low   GOALS: Goals reviewed with patient? Yes  SHORT TERM GOALS: Target date: 04/01/23  Patient understands how to use the anal wand to reduce trigger points in the pelvic floor  Baseline: Goal status: Met 04/21/23  2.  Patient is independent with hip stretches to elongate the pelvic floor.and the hip capsule.  Baseline:  Goal status: Met 04/21/23   LONG TERM GOALS: Target date: 06/25/23  Patient is independent with HEP for flexibility and coordination of the pelvic floor  Baseline:  Goal status: INITIAL  2.  Patient stool diameter is the size of nickel or bigger due to elongation of the anal canal and relaxation of the puborectalis.  Baseline:  Goal status: INITIAL  3.  Sphincter strength is 4/5 and has the ability to fully relax the puborectalis.  Baseline:  Goal status: Met 05/12/23  4.  Patient has a strong and full urine stream due to the ability to relax his pelvic floor.  Baseline:  Goal status: met 05/26/23  5.  Patient feels he is able to fully empty his rectum when having a bowel movement due to the puborectalis muscle relaxing.  Baseline:  Goal status: ongoing 05/26/23     PLAN:  PT FREQUENCY: 1x/week  PT DURATION: other: 4 months  PLANNED INTERVENTIONS: Therapeutic exercises, Therapeutic activity, Neuromuscular re-education, Patient/Family education, Joint mobilization, Dry Needling, Electrical stimulation, Spinal mobilization, Cryotherapy, Moist heat, Ultrasound, Biofeedback, and Manual therapy  PLAN FOR NEXT SESSION: dry needling to the pelvic floor, manual work to the pelvic  floor, manual work to the perineal body, see how his new program is doing and if he got the larger dilators  Eulis Foster, PT 05/26/23 5:06 PM

## 2023-05-31 ENCOUNTER — Telehealth: Payer: Self-pay | Admitting: Physical Therapy

## 2023-05-31 ENCOUNTER — Ambulatory Visit: Payer: Commercial Managed Care - PPO | Admitting: Physical Therapy

## 2023-05-31 NOTE — Telephone Encounter (Signed)
Called patient and left a message about his missed appointment today at 16:15.  Eulis Foster, PT @11 /25/24@ 4:48 PM

## 2023-08-23 ENCOUNTER — Ambulatory Visit: Payer: Commercial Managed Care - PPO | Attending: Gastroenterology | Admitting: Physical Therapy

## 2023-08-23 ENCOUNTER — Encounter: Payer: Self-pay | Admitting: Physical Therapy

## 2023-08-23 DIAGNOSIS — R278 Other lack of coordination: Secondary | ICD-10-CM | POA: Diagnosis present

## 2023-08-23 DIAGNOSIS — K5902 Outlet dysfunction constipation: Secondary | ICD-10-CM | POA: Insufficient documentation

## 2023-08-23 NOTE — Therapy (Signed)
OUTPATIENT PHYSICAL THERAPY MALE PELVIC TREATMENT   Patient Name: Jacob Graham MRN: [id number] DOB:20-Dec-1970, 53 y.o., male Today's Date: 08/23/2023  END OF SESSION:  PT End of Session - 08/23/23 1619     Visit Number 7    Date for PT Re-Evaluation 12/24/23    Authorization Type [employer]    PT Start Time 1615    PT Stop Time 1655    PT Time Calculation (min) 40 min    Activity Tolerance Patient tolerated treatment well    Behavior During Therapy WFL for tasks assessed/performed             Past Medical History:  Diagnosis Date   Asthma    Diverticulitis    IBS (irritable bowel syndrome)    Rectal bleeding    Wears glasses    Past Surgical History:  Procedure Laterality Date   ANAL RECTAL MANOMETRY N/A 07/24/2020   Procedure: ANO RECTAL MANOMETRY;  Surgeon: Napoleon Form, MD;  Location: [facility];  Service: Endoscopy;  Laterality: N/A;   APPENDECTOMY  1999   colonoscopy  2008, 2014   CYSTOSCOPY WITH RETROGRADE PYELOGRAM, URETEROSCOPY AND STENT PLACEMENT Bilateral 10/25/2019   Procedure: CYSTOSCOPY WITH BILATERAL RETROGRADE, FIREFLY INJECTION  AND URETERAL CATHETER PLACEMENT;  Surgeon: Sebastian Ache, MD;  Location: [facility];  Service: Urology;  Laterality: Bilateral;   EVALUATION UNDER ANESTHESIA WITH FISTULECTOMY N/A 10/09/2015   Procedure: EXAM UNDER ANESTHESIA; FISTULOTOMY;  Surgeon: Romie Levee, MD;  Location: [facility];  Service: General;  Laterality: N/A;   KNEE SURGERY  1993   Patient Active Problem List   Diagnosis Date Noted   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    Dyssynergic defecation    Diverticular disease 10/25/2019   Wears glasses    IBS (irritable bowel syndrome)    Rectal bleeding    Diverticulitis    Asthma     PCP: [facility]  REFERRING PROVIDER: Napoleon Form, MD   REFERRING DIAG: [phone] (ICD-10-CM) - Dyssynergic defecation   THERAPY DIAG:  Other lack of coordination - Plan: PT plan of  care cert/re-cert  Dyssynergic defecation - Plan: PT plan of care cert/re-cert  Rationale for Evaluation and Treatment: Rehabilitation  ONSET DATE: 02/23/23  SUBJECTIVE:                                                                                                                                                                                           SUBJECTIVE STATEMENT: I am having a hard time. I should be using the dilator more. I am having ribbon stools. Trouble having a bowel movement. If I do  not go like I think I should go I have to use a good dose of Miralax. I can expel the third dilator by myself. I do not feel the sharpness with the wand.   PAIN:  Are you having pain? Yes NPRS scale: 5/10 with inserts; 2/10 with bowel movements Pain location: rectum Pain type: sharp Pain description: intermittent   Aggravating factors: while having a bowel movement and using the dilators Relieving factors: not having a bowel movement  PRECAUTIONS: None  RED FLAGS: None   WEIGHT BEARING RESTRICTIONS: No  FALLS:  Has patient fallen in last 6 months? Yes. Number of falls fell at work with a full back pack and was going too fast but not due to balance  LIVING ENVIRONMENT: Lives with: lives with their spouse  OCCUPATION: sitting  PLOF: Independent  PATIENT GOALS: reduce tension in the anus  PERTINENT HISTORY:  Diverticulitis; IBS; Appendectomy;    BOWEL MOVEMENT: Pain with bowel movement: Yes  Type of bowel movement:Type (Bristol Stool Scale) ribbon short stool, Frequency every 2-3 days, and Strain No, needs to focus on bowel movements to use the correct technique Fully empty rectum: No Leakage: Yes: after a bowel movement Pads: No Fiber supplement: Yes: Miralax  URINATION: Pain with urination: No Fully empty bladder: Yes: times he does not feel like he has to urinate after drinking a lot of water, decreased sensation Stream:  has to work at his stream Urgency: No,  sit to stand at work will get sensation to have to urinate Frequency: average Leakage:  none  INTERCOURSE:no issues    OBJECTIVE:   DIAGNOSTIC FINDINGS:  none   COGNITION: Overall cognitive status: Within functional limits for tasks assessed     SENSATION: Light touch: Appears intact Proprioception: Appears intact     POSTURE: No Significant postural limitations  PELVIC ALIGNMENT:  LUMBARAROM/PROM:  A/PROM A/PROM  eval  Right rotation Decreased by 25% with pain in right groin   (Blank rows = not tested)  LOWER EXTREMITY AROM/PROM:  P/PROM Right eval Left eval  Hip flexion Full with pain in left groin Full with pain in left groin  Hip abduction 25 25 with pain in groin  Hip internal rotation 10 with pain in groin 20 with pain in groin  Hip external rotation 45 50   (Blank rows = not tested)  LOWER EXTREMITY MMT:  MMT Right eval Left eval  Hip abduction 5/5 4/5   PALPATION:             External Perineal Exam able to pucker the anus              Internal Pelvic Floor the puborectalis is not able to relax, tenderness located in the right side of the puborectalis, tightness in left puborectalis, iliococcygeus and obturator internist Patient confirms identification and approves PT to assess internal pelvic floor and treatment Yes  PELVIC MMT:   MMT eval 05/12/23 08/23/23  Internal Anal Sphincter 3/5 4/5 4/5  External Anal Sphincter 3/5 4/5 4/5  Puborectalis 4/5 4/5 4/5  (Blank rows = not tested)  TONE: increased  TODAY'S TREATMENT:    08/23/23 Manual: Internal pelvic floor techniques: No emotional/communication barriers or cognitive limitation. Patient is motivated to learn. Patient understands and agrees with treatment goals and plan. PT explains patient will be examined in standing, sitting, and lying down to see how their muscles and joints work. When they are ready, they will be asked to remove their underwear so PT can examine their  perineum. The  patient is also given the option of providing their own chaperone as one is not provided in our facility. The patient also has the right and is explained the right to defer or refuse any part of the evaluation or treatment including the internal exam. With the patient's consent, PT will use one gloved finger to gently assess the muscles of the pelvic floor, seeing how well it contracts and relaxes and if there is muscle symmetry. After, the patient will get dressed and PT and patient will discuss exam findings and plan of care. PT and patient discuss plan of care, schedule, attendance policy and HEP activities.  Going through the rectum working on the internal and external anal sphincter, along the anterior and posterior rectal area where there is thickness of the tissue.  Self-care: Discussed with patient using the dilators and massaging the rectal canal to improve the tissue mobility and reduce the ribbon like stool    05/26/23 Exercises: Stretches/mobility: Mobilization of the diaphragm using the small ball in prone with ball under the diaphragm then lay on side to breath into the ball Sitting with hands into the diaphragm and bend forward to mobilize the diaphragm Discussed with patient on getting the last 2 larger dilators to use to expand the anal canal and not stop the stool in stead of the blow up catheter  Discussed with patient on using 2 stool to have him squat further to help with a full bowel movement Went over yoga videos for constipation and sent him ones that were the best.   05/19/23 Manual: Soft tissue mobilization: To assess for dry needling Manual work to the levator ani, perineal body, puborectalis, ischiocavernosus to elongate after dry needling Internal pelvic floor techniques: Going through the rectum to work on the puborectalis, external anal sphincter, obturator internist and iliococcygeus Trigger Point Dry-Needling  Treatment instructions: Expect mild to moderate  muscle soreness. S/S of pneumothorax if dry needled over a lung field, and to seek immediate medical attention should they occur. Patient verbalized understanding of these instructions and education.  Patient Consent Given: Yes Education handout provided: Previously provided Muscles treated: perineal body, bilateral  ischiocavernosus, bilateral superior transverse, left puborectalis, bilateral coccygeus, bilateral external anal sphincter Electrical stimulation performed: No Parameters: N/A Treatment response/outcome: elongation of muscle and trigger point response Neuromuscular re-education: Down training: Laying on side and breathing  out with bearing down to facilitate pushing the stool out without contracting the pelvic floor                                                                                                                              PATIENT EDUCATION:  03/05/23 Education details: how to relax the pelvic floor, using his dilator for 10 minutes, information on dry needling Person educated: Patient Education method: Explanation, Demonstration, Tactile cues, Verbal cues, and Handouts Education comprehension: verbalized understanding, returned demonstration, verbal cues required, tactile cues required, and needs further education  HOME EXERCISE PROGRAM: See above.   ASSESSMENT:  CLINICAL IMPRESSION: Patient is a 53 y.o. male who was seen today for physical therapy treatment for constipation. Patient is able to push out the dilator 2 times but then has trouble the third time.   Patient would benefit from the larger dilators to stretch his anal canal further and work on push the larger part of the dilator out so the stool does not get stuck. Pelvic floor strength is 4/5. He is still having ribbon like stool. He is not having some stool leakage. Patient is using the next size dilator but feel the top of the dilator hits something that is uncomfortable. Patient will benefit from  skilled therapy to improve pelvic floor muscle mobility and reduce constipation.    OBJECTIVE IMPAIRMENTS: decreased coordination, decreased ROM, decreased strength, increased fascial restrictions, increased muscle spasms, impaired tone, and pain.   ACTIVITY LIMITATIONS: toileting  PARTICIPATION LIMITATIONS: occupation  PERSONAL FACTORS: Time since onset of injury/illness/exacerbation and 1-2 comorbidities: Diverticulitis; IBS; Appendectomy  are also affecting patient's functional outcome.   REHAB POTENTIAL: Excellent  CLINICAL DECISION MAKING: Stable/uncomplicated  EVALUATION COMPLEXITY: Low   GOALS: Goals reviewed with patient? Yes  SHORT TERM GOALS: Target date: 04/01/23  Patient understands how to use the anal wand to reduce trigger points in the pelvic floor  Baseline: Goal status: Met 04/21/23  2.  Patient is independent with hip stretches to elongate the pelvic floor.and the hip capsule.  Baseline:  Goal status: Met 04/21/23   LONG TERM GOALS: Target date: 12/24/23  Patient is independent with HEP for flexibility and coordination of the pelvic floor  Baseline:  Goal status: ongoing 08/23/23  2.  Patient stool diameter is the size of nickel or bigger due to elongation of the anal canal and relaxation of the puborectalis.  Baseline:  Goal status: ongoing 08/23/23  3.  Sphincter strength is 4/5 and has the ability to fully relax the puborectalis.  Baseline:  Goal status: Met 05/12/23  4.  Patient has a strong and full urine stream due to the ability to relax his pelvic floor.  Baseline:  Goal status: met 05/26/23  5.  Patient feels he is able to fully empty his rectum when having a bowel movement due to the puborectalis muscle relaxing.  Baseline:  Goal status: ongoing 08/23/23     PLAN:  PT FREQUENCY: d 1 time per month  PT DURATION: other: 6 months  PLANNED INTERVENTIONS: Therapeutic exercises, Therapeutic activity, Neuromuscular re-education,  Patient/Family education, Joint mobilization, Dry Needling, Electrical stimulation, Spinal mobilization, Cryotherapy, Moist heat, Ultrasound, Biofeedback, and Manual therapy  PLAN FOR NEXT SESSION: dry needling to the pelvic floor, manual work to the pelvic floor, manual work to the perineal body, see how his new program is doing and if he got the larger dilators  Eulis Foster, PT 08/23/23 5:07 PM

## 2023-10-29 ENCOUNTER — Encounter: Payer: Self-pay | Admitting: Physical Therapy

## 2023-10-29 ENCOUNTER — Ambulatory Visit: Payer: Commercial Managed Care - PPO | Attending: Gastroenterology | Admitting: Physical Therapy

## 2023-10-29 DIAGNOSIS — K5902 Outlet dysfunction constipation: Secondary | ICD-10-CM | POA: Diagnosis present

## 2023-10-29 DIAGNOSIS — R278 Other lack of coordination: Secondary | ICD-10-CM | POA: Insufficient documentation

## 2023-10-29 NOTE — Therapy (Signed)
 OUTPATIENT PHYSICAL THERAPY MALE PELVIC TREATMENT   Patient Name: Jacob Graham MRN: [id number] DOB:1970/07/24, 53 y.o., male Today's Date: 10/29/2023  END OF SESSION:  PT End of Session - 10/29/23 0858     Visit Number 8    Date for PT Re-Evaluation 12/24/23    Authorization Type [employer]    PT Start Time 0855    PT Stop Time 0925    PT Time Calculation (min) 30 min    Activity Tolerance Patient tolerated treatment well    Behavior During Therapy WFL for tasks assessed/performed             Past Medical History:  Diagnosis Date   Asthma    Diverticulitis    IBS (irritable bowel syndrome)    Rectal bleeding    Wears glasses    Past Surgical History:  Procedure Laterality Date   ANAL RECTAL MANOMETRY N/A 07/24/2020   Procedure: ANO RECTAL MANOMETRY;  Surgeon: Sergio Dandy, MD;  Location: [facility];  Service: Endoscopy;  Laterality: N/A;   APPENDECTOMY  1999   colonoscopy  2008, 2014   CYSTOSCOPY WITH RETROGRADE PYELOGRAM, URETEROSCOPY AND STENT PLACEMENT Bilateral 10/25/2019   Procedure: CYSTOSCOPY WITH BILATERAL RETROGRADE, FIREFLY INJECTION  AND URETERAL CATHETER PLACEMENT;  Surgeon: Osborn Blaze, MD;  Location: [facility];  Service: Urology;  Laterality: Bilateral;   EVALUATION UNDER ANESTHESIA WITH FISTULECTOMY N/A 10/09/2015   Procedure: EXAM UNDER ANESTHESIA; FISTULOTOMY;  Surgeon: Joyce Nixon, MD;  Location: [facility];  Service: General;  Laterality: N/A;   KNEE SURGERY  1993   Patient Active Problem List   Diagnosis Date Noted   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    Dyssynergic defecation    Diverticular disease 10/25/2019   Wears glasses    IBS (irritable bowel syndrome)    Rectal bleeding    Diverticulitis    Asthma     PCP: [facility]  REFERRING PROVIDER: Sergio Dandy, MD   REFERRING DIAG: [phone] (ICD-10-CM) - Dyssynergic defecation   THERAPY DIAG:  Other lack of coordination  Dyssynergic  defecation  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  Rationale for Evaluation and Treatment: Rehabilitation  ONSET DATE: 02/23/23  SUBJECTIVE:                                                                                                                                                                                           SUBJECTIVE STATEMENT: I am on the second largest dilator. The bowel movements are thinner than the dilator I am using. I can push the dilator out but not able to push the stool out. My muscles  are more relaxed. When I go a certain depth there is tightness. No pain with bowel movements.    PAIN:  Are you having pain? Yes NPRS scale: 5/10 with inserts; 2/10 with bowel movements Pain location: rectum Pain type: sharp Pain description: intermittent   Aggravating factors: while having a bowel movement and using the dilators Relieving factors: not having a bowel movement  PRECAUTIONS: None  RED FLAGS: None   WEIGHT BEARING RESTRICTIONS: No  FALLS:  Has patient fallen in last 6 months? Yes. Number of falls fell at work with a full back pack and was going too fast but not due to balance  LIVING ENVIRONMENT: Lives with: lives with their spouse  OCCUPATION: sitting  PLOF: Independent  PATIENT GOALS: reduce tension in the anus  PERTINENT HISTORY:  Diverticulitis; IBS; Appendectomy;    BOWEL MOVEMENT: Pain with bowel movement: Yes  Type of bowel movement:Type (Bristol Stool Scale) ribbon short stool, Frequency every 2-3 days, and Strain No, needs to focus on bowel movements to use the correct technique Fully empty rectum: No Leakage: Yes: after a bowel movement Pads: No Fiber supplement: Yes: Miralax  URINATION: Pain with urination: No Fully empty bladder: Yes: times he does not feel like he has to urinate after drinking a lot of water , decreased sensation Stream:  has to work at his stream Urgency: No, sit to stand at work will get sensation to  have to urinate Frequency: average Leakage:  none  INTERCOURSE:no issues    OBJECTIVE:   DIAGNOSTIC FINDINGS:  none   COGNITION: Overall cognitive status: Within functional limits for tasks assessed     SENSATION: Light touch: Appears intact Proprioception: Appears intact     POSTURE: No Significant postural limitations  PELVIC ALIGNMENT:  LUMBARAROM/PROM:  A/PROM A/PROM  eval  Right rotation Decreased by 25% with pain in right groin   (Blank rows = not tested)  LOWER EXTREMITY AROM/PROM:  P/PROM Right eval Left eval  Hip flexion Full with pain in left groin Full with pain in left groin  Hip abduction 25 25 with pain in groin  Hip internal rotation 10 with pain in groin 20 with pain in groin  Hip external rotation 45 50   (Blank rows = not tested)  LOWER EXTREMITY MMT:  MMT Right eval Left eval  Hip abduction 5/5 4/5   PALPATION:             External Perineal Exam able to pucker the anus              Internal Pelvic Floor the puborectalis is not able to relax, tenderness located in the right side of the puborectalis, tightness in left puborectalis, iliococcygeus and obturator internist Patient confirms identification and approves PT to assess internal pelvic floor and treatment Yes  PELVIC MMT:   MMT eval 05/12/23 08/23/23  Internal Anal Sphincter 3/5 4/5 4/5  External Anal Sphincter 3/5 4/5 4/5  Puborectalis 4/5 4/5 4/5  (Blank rows = not tested)  TONE: increased  TODAY'S TREATMENT:    10/29/23 Manual: Internal pelvic floor techniques: No emotional/communication barriers or cognitive limitation. Patient is motivated to learn. Patient understands and agrees with treatment goals and plan. PT explains patient will be examined in standing, sitting, and lying down to see how their muscles and joints work. When they are ready, they will be asked to remove their underwear so PT can examine their perineum. The patient is also given the option of  providing their own chaperone as one  is not provided in our facility. The patient also has the right and is explained the right to defer or refuse any part of the evaluation or treatment including the internal exam. With the patient's consent, PT will use one gloved finger to gently assess the muscles of the pelvic floor, seeing how well it contracts and relaxes and if there is muscle symmetry. After, the patient will get dressed and PT and patient will discuss exam findings and plan of care. PT and patient discuss plan of care, schedule, attendance policy and HEP activities.  Going through the rectum working on the puborectalis and left levator ani, around the alcock's canal where there was tender spot Self-care: Discussed with patient on using glycerine suppositories to assist with pushing stool out and help with the ribbon shape Discussed with patient on the shape of the colon and the ribbon can come from above and to see his doctor    08/23/23 Manual: Internal pelvic floor techniques: No emotional/communication barriers or cognitive limitation. Patient is motivated to learn. Patient understands and agrees with treatment goals and plan. PT explains patient will be examined in standing, sitting, and lying down to see how their muscles and joints work. When they are ready, they will be asked to remove their underwear so PT can examine their perineum. The patient is also given the option of providing their own chaperone as one is not provided in our facility. The patient also has the right and is explained the right to defer or refuse any part of the evaluation or treatment including the internal exam. With the patient's consent, PT will use one gloved finger to gently assess the muscles of the pelvic floor, seeing how well it contracts and relaxes and if there is muscle symmetry. After, the patient will get dressed and PT and patient will discuss exam findings and plan of care. PT and patient discuss plan  of care, schedule, attendance policy and HEP activities.  Going through the rectum working on the internal and external anal sphincter, along the anterior and posterior rectal area where there is thickness of the tissue.  Self-care: Discussed with patient using the dilators and massaging the rectal canal to improve the tissue mobility and reduce the ribbon like stool    05/26/23 Exercises: Stretches/mobility: Mobilization of the diaphragm using the small ball in prone with ball under the diaphragm then lay on side to breath into the ball Sitting with hands into the diaphragm and bend forward to mobilize the diaphragm Discussed with patient on getting the last 2 larger dilators to use to expand the anal canal and not stop the stool in stead of the blow up catheter  Discussed with patient on using 2 stool to have him squat further to help with a full bowel movement Went over yoga videos for constipation and sent him ones that were the best.  PATIENT EDUCATION:  03/05/23 Education details: how to relax the pelvic floor, using his dilator for 10 minutes, information on dry needling Person educated: Patient Education method: Explanation, Demonstration, Tactile cues, Verbal cues, and Handouts Education comprehension: verbalized understanding, returned demonstration, verbal cues required, tactile cues required, and needs further education  HOME EXERCISE PROGRAM: See above.   ASSESSMENT:  CLINICAL IMPRESSION: Patient is a 53 y.o. male who was seen today for physical therapy treatment for constipation. Patient bowel movements are longer and ribbon shape. Patient is not having any rectal pain with bowel movements. Patient does not feel like her fully empties his rectum.  Patient puborectalis was tight and increased length after manual work. He had a tender spot at the 3:00  mark in the pelvic floor. Patient will benefit from skilled therapy to improve pelvic floor muscle mobility and reduce constipation.    OBJECTIVE IMPAIRMENTS: decreased coordination, decreased ROM, decreased strength, increased fascial restrictions, increased muscle spasms, impaired tone, and pain.   ACTIVITY LIMITATIONS: toileting  PARTICIPATION LIMITATIONS: occupation  PERSONAL FACTORS: Time since onset of injury/illness/exacerbation and 1-2 comorbidities: Diverticulitis; IBS; Appendectomy  are also affecting patient's functional outcome.   REHAB POTENTIAL: Excellent  CLINICAL DECISION MAKING: Stable/uncomplicated  EVALUATION COMPLEXITY: Low   GOALS: Goals reviewed with patient? Yes  SHORT TERM GOALS: Target date: 04/01/23  Patient understands how to use the anal wand to reduce trigger points in the pelvic floor  Baseline: Goal status: Met 04/21/23  2.  Patient is independent with hip stretches to elongate the pelvic floor.and the hip capsule.  Baseline:  Goal status: Met 04/21/23   LONG TERM GOALS: Target date: 12/24/23  Patient is independent with HEP for flexibility and coordination of the pelvic floor  Baseline:  Goal status: ongoing 08/23/23  2.  Patient stool diameter is the size of nickel or bigger due to elongation of the anal canal and relaxation of the puborectalis.  Baseline:  Goal status: ongoing 08/23/23  3.  Sphincter strength is 4/5 and has the ability to fully relax the puborectalis.  Baseline:  Goal status: Met 05/12/23  4.  Patient has a strong and full urine stream due to the ability to relax his pelvic floor.  Baseline:  Goal status: met 05/26/23  5.  Patient feels he is able to fully empty his rectum when having a bowel movement due to the puborectalis muscle relaxing.  Baseline:  Goal status: ongoing 08/23/23     PLAN:  PT FREQUENCY: d 1 time per month  PT DURATION: other: 6 months  PLANNED INTERVENTIONS: Therapeutic exercises,  Therapeutic activity, Neuromuscular re-education, Patient/Family education, Joint mobilization, Dry Needling, Electrical stimulation, Spinal mobilization, Cryotherapy, Moist heat, Ultrasound, Biofeedback, and Manual therapy  PLAN FOR NEXT SESSION: dry needling to the pelvic floor, manual work to the pelvic floor, see if the glycerin  is helping, see if he made appt. With MD, RUSI for the anorectal angle  Marsha Skeen, PT 10/29/23 9:34 AM

## 2023-11-15 ENCOUNTER — Emergency Department (HOSPITAL_COMMUNITY)
Admission: EM | Admit: 2023-11-15 | Discharge: 2023-11-15 | Disposition: A | Discharge status: Home / Self Care | Attending: Emergency Medicine | Admitting: Emergency Medicine

## 2023-11-15 ENCOUNTER — Other Ambulatory Visit: Payer: Self-pay

## 2023-11-15 ENCOUNTER — Encounter (HOSPITAL_COMMUNITY): Payer: Self-pay | Admitting: Radiology

## 2023-11-15 ENCOUNTER — Emergency Department (HOSPITAL_COMMUNITY)

## 2023-11-15 DIAGNOSIS — R109 Unspecified abdominal pain: Secondary | ICD-10-CM | POA: Diagnosis present

## 2023-11-15 LAB — CBC WITH DIFFERENTIAL/PLATELET
Abs Immature Granulocytes: 0.02 10*3/uL (ref 0.00–0.07)
Basophils Absolute: 0 10*3/uL (ref 0.0–0.1)
Basophils Relative: 0 %
Eosinophils Absolute: 0.1 10*3/uL (ref 0.0–0.5)
Eosinophils Relative: 1 %
HCT: 46.1 % (ref 39.0–52.0)
Hemoglobin: 15.8 g/dL (ref 13.0–17.0)
Immature Granulocytes: 0 %
Lymphocytes Relative: 23 %
Lymphs Abs: 1.7 10*3/uL (ref 0.7–4.0)
MCH: 31.5 pg (ref 26.0–34.0)
MCHC: 34.3 g/dL (ref 30.0–36.0)
MCV: 91.8 fL (ref 80.0–100.0)
Monocytes Absolute: 0.6 10*3/uL (ref 0.1–1.0)
Monocytes Relative: 8 %
Neutro Abs: 5 10*3/uL (ref 1.7–7.7)
Neutrophils Relative %: 68 %
Platelets: 253 10*3/uL (ref 150–400)
RBC: 5.02 MIL/uL (ref 4.22–5.81)
RDW: 12.8 % (ref 11.5–15.5)
WBC: 7.4 10*3/uL (ref 4.0–10.5)
nRBC: 0 % (ref 0.0–0.2)

## 2023-11-15 LAB — COMPREHENSIVE METABOLIC PANEL WITH GFR
ALT: 27 U/L (ref 0–44)
AST: 20 U/L (ref 15–41)
Albumin: 4.3 g/dL (ref 3.5–5.0)
Alkaline Phosphatase: 63 U/L (ref 38–126)
Anion gap: 9 (ref 5–15)
BUN: 15 mg/dL (ref 6–20)
CO2: 24 mmol/L (ref 22–32)
Calcium: 9.1 mg/dL (ref 8.9–10.3)
Chloride: 105 mmol/L (ref 98–111)
Creatinine, Ser: 0.66 mg/dL (ref 0.61–1.24)
GFR, Estimated: >60 mL/min (ref >60)
Glucose, Bld: 94 mg/dL (ref 70–99)
Potassium: 3.8 mmol/L (ref 3.5–5.1)
Sodium: 138 mmol/L (ref 135–145)
Total Bilirubin: 1.3 mg/dL — ABNORMAL HIGH (ref 0.0–1.2)
Total Protein: 7 g/dL (ref 6.5–8.1)

## 2023-11-15 LAB — LIPASE, BLOOD: Lipase: 81 U/L — ABNORMAL HIGH (ref 11–51)

## 2023-11-15 LAB — URINALYSIS, ROUTINE W REFLEX MICROSCOPIC
Bilirubin Urine: NEGATIVE
Glucose, UA: NEGATIVE mg/dL
Hgb urine dipstick: NEGATIVE
Ketones, ur: 5 mg/dL — AB
Leukocytes,Ua: NEGATIVE
Nitrite: NEGATIVE
Protein, ur: NEGATIVE mg/dL
Specific Gravity, Urine: 1.027 (ref 1.005–1.030)
pH: 5 (ref 5.0–8.0)

## 2023-11-15 NOTE — Discharge Instructions (Signed)
 Is unclear what is causing your abdominal pain.  Be sure to take your medicines for constipation including MiraLAX, drinking increased fluids, and increasing your fiber intake.  Otherwise, follow-up with your primary care physician.  If you develop worsening, continued, or recurrent abdominal pain, uncontrolled vomiting, fever, chest or back pain, or any other new/concerning symptoms then return to the ER for evaluation.

## 2023-11-15 NOTE — ED Provider Triage Note (Signed)
 Emergency Medicine Provider Triage Evaluation Note  CHASTEN CROMPTON , a 53 y.o. male  was evaluated in triage.  Pt complains of abdominal pain.  Started in the left lower quadrant but now radiating to the lower back bilaterally.  Endorses nausea but no vomiting or diarrhea.  Denies urinary symptoms and fever.  Does report history of diverticulitis and states that the pain does feel similar.  Review of Systems  Positive: See above Negative: See above  Physical Exam  BP (!) 148/93 (BP Location: Left Arm)   Pulse 62   Temp 97.7 F (36.5 C) (Oral)   Resp 17   SpO2 100%  Gen:   Awake, no distress   Resp:  Normal effort  MSK:   Moves extremities without difficulty  Other:    Medical Decision Making  Medically screening exam initiated at 2:37 PM.  Appropriate orders placed.  BARTLOMIEJ BURRS was informed that the remainder of the evaluation will be completed by another provider, this initial triage assessment does not replace that evaluation, and the importance of remaining in the ED until their evaluation is complete.  Work up started   Janalee Mcmurray, PA-C 11/15/23 1438

## 2023-11-15 NOTE — ED Provider Notes (Signed)
**Jacob Jacob**  Jacob Jacob   CSN: [id number] Arrival date & time: 11/15/23  1328     History  Chief Complaint  Patient presents with   Flank Pain    Pt presents with L sided flank pain that started 0700 this morning. Denies urinary symptoms, has intermittent pain. Hx pelvic floor disorder, has associated abdominal pain, last BM yesterday.     Jacob Jacob is a 53 y.o. male.  HPI 53 year old male presents with abdominal pain and back pain.  Started this morning when he woke up.  Seem to be from his left sided low abdomen and radiating into his back and then would settle in his paraspinal back on both sides.  Symptoms are a lot better than when he first occurred this morning.  He states he was sweating while he was at work and his coworkers encouraged him to come here to for evaluation of a possible kidney stone.  He has chronic constipation and took some MiraLAX for this over the weekend.  No fevers, vomiting, diarrhea.  No incontinence or lower extremity weakness or numbness.  Feels like his symptoms would get better with position changes.  Right now his pain is pretty minimal.  Home Medications Prior to Admission medications   Medication Sig Start Date End Date Taking? Authorizing Provider  ascorbic acid (C 500/ROSE HIPS) 500 MG tablet Take 500 mg by mouth daily.    [provider]  BLACK CURRANT SEED OIL PO Take by mouth.    [provider]  hydrocortisone  (ANUSOL -HC) 25 MG suppository Place 1 suppository (25 mg total) rectally 2 (two) times daily as needed for hemorrhoids or anal itching. 03/18/22   Nandigam, Kavitha V, MD  ibuprofen (ADVIL) 200 MG tablet Take 800 mg by mouth every 6 (six) hours as needed for fever, headache or mild pain.    [provider]  linaclotide  (LINZESS ) 145 MCG CAPS capsule Take 1 capsule (145 mcg total) by mouth daily before breakfast. 03/18/22   Nandigam, Kavitha V, MD  MAGNESIUM  PO  Take 1 tablet by mouth daily.    [provider]  Multiple Vitamin (MULTIVITAMIN WITH MINERALS) TABS tablet Take 1 tablet by mouth daily.    [provider]  polyethylene glycol (MIRALAX / GLYCOLAX) 17 g packet Take 17 g by mouth 2 (two) times daily as needed for mild constipation.    [provider]  shark liver oil-cocoa butter (PREPARATION H) 0.25-3-85.5 % suppository Place 1 suppository rectally as needed for hemorrhoids.    [provider]      Allergies    Banana and Iodinated contrast media    Review of Systems   Review of Systems  Constitutional:  Negative for fever.  Gastrointestinal:  Positive for abdominal pain. Negative for vomiting.  Musculoskeletal:  Positive for back pain.  Neurological:  Negative for weakness and numbness.    Physical Exam Updated Vital Signs BP (!) 140/104 (BP Location: Right Arm)   Pulse 63   Temp 98.5 F (36.9 C) (Oral)   Resp 17   SpO2 100%  Physical Exam Vitals and nursing Jacob reviewed.  Constitutional:      Appearance: He is well-developed.  HENT:     Head: Normocephalic and atraumatic.  Cardiovascular:     Rate and Rhythm: Normal rate and regular rhythm.     Heart sounds: Normal heart sounds.  Pulmonary:     Effort: Pulmonary effort is normal.     Breath  sounds: Normal breath sounds.  Abdominal:     General: There is no distension.     Palpations: Abdomen is soft.     Tenderness: There is no abdominal tenderness.  Musculoskeletal:     Thoracic back: No tenderness.     Lumbar back: No tenderness.  Skin:    General: Skin is warm and dry.  Neurological:     Mental Status: He is alert.     Comments: 5/5 strength in BLE. Grossly normal sensation.     ED Results / Procedures / Treatments   Labs (all labs ordered are listed, but only abnormal results are displayed) Labs Reviewed  COMPREHENSIVE METABOLIC PANEL WITH GFR - Abnormal; Notable for the following components:      Result Value    Total Bilirubin 1.3 (*)    All other components within normal limits  LIPASE, BLOOD - Abnormal; Notable for the following components:   Lipase 81 (*)    All other components within normal limits  URINALYSIS, ROUTINE W REFLEX MICROSCOPIC - Abnormal; Notable for the following components:   Ketones, ur 5 (*)    All other components within normal limits  CBC WITH DIFFERENTIAL/PLATELET    EKG None  Radiology CT L-SPINE NO CHARGE Result Date: 11/15/2023 CLINICAL DATA:  Abdominal pain which started in the left lower quadrant but now radiates into the low back. Nausea. History of diverticulitis. EXAM: CT Lumbar Spine without contrast TECHNIQUE: Technique: Multiplanar CT images of the lumbar spine were reconstructed from contemporary CT of the Abdomen and Pelvis. RADIATION DOSE REDUCTION: This exam was performed according to the departmental dose-optimization program which includes automated exposure control, adjustment of the mA and/or kV according to patient size and/or use of iterative reconstruction technique. CONTRAST:  No additional COMPARISON:  Abdominopelvic CT 07/29/2020 FINDINGS: Segmentation: There are 5 lumbar type vertebral bodies. Alignment: Normal. Vertebrae: No evidence of acute fracture or pars defect. Stable bone island in the right aspect of the L1 vertebral body. Paraspinal and other soft tissues: No acute or significant paraspinal findings. Intra-abdominal findings are dictated separately. Disc levels: Preserved disc height at each level. No significant disc herniation or foraminal narrowing is demonstrated. Mild-to-moderate multilevel facet hypertrophy contributes to mild multifactorial spinal stenosis at L3-4 and L4-5. IMPRESSION: 1. No acute osseous findings or significant disc herniation. 2. Mild multilevel spondylosis. Mild multifactorial spinal stenosis at L3-4 and L4-5. 3. Intra-abdominal findings are dictated separately. Electronically Signed   By: Elmon Hagedorn M.D.   On:  11/15/2023 17:12   CT ABDOMEN PELVIS WO CONTRAST Result Date: 11/15/2023 CLINICAL DATA:  Abdominal pain, acute, nonlocalized Abdominal pain which started in the left lower quadrant but now radiates into the low back. Nausea. History of diverticulitis. EXAM: CT ABDOMEN AND PELVIS WITHOUT CONTRAST TECHNIQUE: Multidetector CT imaging of the abdomen and pelvis was performed following the standard protocol without IV contrast. RADIATION DOSE REDUCTION: This exam was performed according to the departmental dose-optimization program which includes automated exposure control, adjustment of the mA and/or kV according to patient size and/or use of iterative reconstruction technique. COMPARISON:  Abdominopelvic CT 07/29/2020 FINDINGS: Lower chest: Clear lung bases. No significant pleural or pericardial effusion. Hepatobiliary: The liver appears unremarkable as imaged in the noncontrast state. No focal abnormalities are identified. No evidence of gallstones, gallbladder wall thickening or biliary dilatation. Pancreas: Unremarkable. No pancreatic ductal dilatation or surrounding inflammatory changes. Spleen: Normal in size without focal abnormality. Adrenals/Urinary Tract: Both adrenal glands appear normal. Punctate nonobstructing calculus in the upper pole  of the left kidney. No evidence of ureteral calculus, hydronephrosis or perinephric soft tissue stranding. The bladder appears unremarkable for its degree of distention. Stomach/Bowel: No enteric contrast administered. The stomach appears unremarkable for its degree of distension. No evidence of bowel wall thickening, distention or surrounding inflammatory change. The appendix is not visualized and may be surgically absent. There is no pericecal inflammation. Stable postsurgical changes from sigmoid colon resection and anastomosis. Prominent stool again noted throughout the colon. Vascular/Lymphatic: There are no enlarged abdominal or pelvic lymph nodes. Minimal aortoiliac  atherosclerosis. No evidence of aneurysm. Reproductive: The prostate gland and seminal vesicles appear unchanged. Other: No evidence of abdominal wall mass or hernia. No ascites or pneumoperitoneum. Musculoskeletal: No acute or significant osseous findings. Lumbar spine details dictated separately. IMPRESSION: 1. No acute findings or explanation for the patient's symptoms. Prominent stool throughout the colon, suggesting constipation. 2. Punctate nonobstructing left renal calculus. No evidence of ureteral calculus or hydronephrosis. 3. Stable postsurgical changes from sigmoid colon resection and anastomosis. 4. Lumbar spine details dictated separately. 5.  Aortic Atherosclerosis (ICD10-I70.0). Electronically Signed   By: Elmon Hagedorn M.D.   On: 11/15/2023 17:07    Procedures Procedures    Medications Ordered in ED Medications - No data to display  ED Course/ Medical Decision Making/ A&P                                 Medical Decision Making Amount and/or Complexity of Data Reviewed Labs:     Details: Normal WBC Radiology: independent interpretation performed.    Details: No diverticulitis or obvious obstructing ureteral stone   Patient presents with abdominal/back pain.  Could be occult or past ureteral stone but his CT is reassuring.  There is no blood in the urine.  His symptoms of essentially resolved with only minimal discomfort.  He has no red flags for the back pain.  Neuroexam is unremarkable.  Could also be constipation related which he has been having an issue with recently.  Either way, with a reassuring CT, improvement in symptoms and benign workup I think he is stable for discharge home to follow-up with PCP.  Low suspicion for spine emergency such as spinal cord compression or infection.        Final Clinical Impression(s) / ED Diagnoses Final diagnoses:  Abdominal pain, unspecified abdominal location    Rx / DC Orders ED Discharge Orders     None          Jerilynn Montenegro, MD 11/15/23 2104

## 2023-11-18 LAB — LAB REPORT - SCANNED: EGFR (Non-African Amer.): 104

## 2023-11-19 ENCOUNTER — Telehealth: Payer: Self-pay | Admitting: Gastroenterology

## 2023-11-19 NOTE — Telephone Encounter (Signed)
 Sure its fine

## 2023-11-19 NOTE — Telephone Encounter (Signed)
 Good afternoon Dr General Kenner  Could you please review and advise on transfer.   Thank you

## 2023-11-19 NOTE — Telephone Encounter (Signed)
 Good Afternoon Dr Leonia Raman   Patient requesting to transfer care to Dr General Kenner due to personal preference.   Please review and advise on transfer.   Thank you

## 2023-11-22 NOTE — Telephone Encounter (Signed)
 I can see him in the office for clinic visit if that is his preference and okay with Dr. Nandigam

## 2023-11-23 NOTE — Telephone Encounter (Signed)
 Patient scheduled for consult with pa

## 2023-11-24 ENCOUNTER — Encounter: Payer: Self-pay | Admitting: Physical Therapy

## 2023-11-24 ENCOUNTER — Ambulatory Visit: Attending: Gastroenterology | Admitting: Physical Therapy

## 2023-11-24 DIAGNOSIS — K5902 Outlet dysfunction constipation: Secondary | ICD-10-CM | POA: Diagnosis present

## 2023-11-24 DIAGNOSIS — M6281 Muscle weakness (generalized): Secondary | ICD-10-CM | POA: Diagnosis present

## 2023-11-24 DIAGNOSIS — R278 Other lack of coordination: Secondary | ICD-10-CM | POA: Diagnosis present

## 2023-11-24 NOTE — Therapy (Signed)
 OUTPATIENT PHYSICAL THERAPY MALE PELVIC TREATMENT   Patient Name: Jacob Graham MRN: [id number] DOB:1970-09-18, 53 y.o., male Today's Date: 11/24/2023  END OF SESSION:  PT End of Session - 11/24/23 1530     Visit Number 9    Date for PT Re-Evaluation 12/24/23    Authorization Type [employer]    PT Start Time 1530    PT Stop Time 1610    PT Time Calculation (min) 40 min    Activity Tolerance Patient tolerated treatment well    Behavior During Therapy WFL for tasks assessed/performed             Past Medical History:  Diagnosis Date   Asthma    Diverticulitis    IBS (irritable bowel syndrome)    Rectal bleeding    Wears glasses    Past Surgical History:  Procedure Laterality Date   ANAL RECTAL MANOMETRY N/A 07/24/2020   Procedure: ANO RECTAL MANOMETRY;  Surgeon: Sergio Dandy, MD;  Location: [facility];  Service: Endoscopy;  Laterality: N/A;   APPENDECTOMY  1999   colonoscopy  2008, 2014   CYSTOSCOPY WITH RETROGRADE PYELOGRAM, URETEROSCOPY AND STENT PLACEMENT Bilateral 10/25/2019   Procedure: CYSTOSCOPY WITH BILATERAL RETROGRADE, FIREFLY INJECTION  AND URETERAL CATHETER PLACEMENT;  Surgeon: Osborn Blaze, MD;  Location: [facility];  Service: Urology;  Laterality: Bilateral;   EVALUATION UNDER ANESTHESIA WITH FISTULECTOMY N/A 10/09/2015   Procedure: EXAM UNDER ANESTHESIA; FISTULOTOMY;  Surgeon: Joyce Nixon, MD;  Location: [facility];  Service: General;  Laterality: N/A;   KNEE SURGERY  1993   Patient Active Problem List   Diagnosis Date Noted   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    Dyssynergic defecation    Diverticular disease 10/25/2019   Wears glasses    IBS (irritable bowel syndrome)    Rectal bleeding    Diverticulitis    Asthma     PCP: [facility]  REFERRING PROVIDER: Sergio Dandy, MD   REFERRING DIAG: [phone] (ICD-10-CM) - Dyssynergic defecation   THERAPY DIAG:  Other lack of coordination  Dyssynergic  defecation  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  Muscle weakness (generalized)  Rationale for Evaluation and Treatment: Rehabilitation  ONSET DATE: 02/23/23  SUBJECTIVE:                                                                                                                                                                                           SUBJECTIVE STATEMENT: Today I am okay. I went to the ER last week. I have a kidney stone that should not hurt. I had stool in the intestines. I am using the  dilators. I can push the second to last dilator out. I still get the pain in the left lower abdomen. My stool still are ribbon like.     PAIN:  Are you having pain? Yes NPRS scale: 5/10 with inserts; 2/10 with bowel movements Pain location: rectum Pain type: sharp Pain description: intermittent   Aggravating factors: while having a bowel movement and using the dilators Relieving factors: not having a bowel movement  PRECAUTIONS: None  RED FLAGS: None   WEIGHT BEARING RESTRICTIONS: No  FALLS:  Has patient fallen in last 6 months? Yes. Number of falls fell at work with a full back pack and was going too fast but not due to balance  LIVING ENVIRONMENT: Lives with: lives with their spouse  OCCUPATION: sitting  PLOF: Independent  PATIENT GOALS: reduce tension in the anus  PERTINENT HISTORY:  Diverticulitis; IBS; Appendectomy;    BOWEL MOVEMENT: Pain with bowel movement: Yes  Type of bowel movement:Type (Bristol Stool Scale) ribbon short stool, Frequency every 2-3 days, and Strain No, needs to focus on bowel movements to use the correct technique Fully empty rectum: No Leakage: Yes: after a bowel movement Pads: No Fiber supplement: Yes: Miralax  URINATION: Pain with urination: No Fully empty bladder: Yes: times he does not feel like he has to urinate after drinking a lot of water , decreased sensation Stream: has to work at his stream Urgency: No, sit to  stand at work will get sensation to have to urinate Frequency: average Leakage: none  INTERCOURSE:no issues    OBJECTIVE:   DIAGNOSTIC FINDINGS:  none   COGNITION: Overall cognitive status: Within functional limits for tasks assessed     SENSATION: Light touch: Appears intact Proprioception: Appears intact     POSTURE: No Significant postural limitations  PELVIC ALIGNMENT:  LUMBARAROM/PROM:  A/PROM A/PROM  eval  Right rotation Decreased by 25% with pain in right groin   (Blank rows = not tested)  LOWER EXTREMITY AROM/PROM:  P/PROM Right eval Left eval  Hip flexion Full with pain in left groin Full with pain in left groin  Hip abduction 25 25 with pain in groin  Hip internal rotation 10 with pain in groin 20 with pain in groin  Hip external rotation 45 50   (Blank rows = not tested)  LOWER EXTREMITY MMT:  MMT Right eval Left eval  Hip abduction 5/5 4/5   PALPATION:             External Perineal Exam able to pucker the anus              Internal Pelvic Floor the puborectalis is not able to relax, tenderness located in the right side of the puborectalis, tightness in left puborectalis, iliococcygeus and obturator internist Patient confirms identification and approves PT to assess internal pelvic floor and treatment Yes  PELVIC MMT:   MMT eval 05/12/23 08/23/23  Internal Anal Sphincter 3/5 4/5 4/5  External Anal Sphincter 3/5 4/5 4/5  Puborectalis 4/5 4/5 4/5  (Blank rows = not tested)  TONE: increased  TODAY'S TREATMENT:    11/24/23 Manual: Soft tissue mobilization: Circular massage to the abdomen to improve peristalic motion Manual work to the diaphragm to increase mobility  Myofascial release: Facial release throughout the abdomen to go through the restrictions to improve movement of stool through the intestines    10/29/23 Manual: Internal pelvic floor techniques: No emotional/communication barriers or cognitive limitation. Patient is  motivated to learn. Patient understands and agrees with treatment goals  and plan. PT explains patient will be examined in standing, sitting, and lying down to see how their muscles and joints work. When they are ready, they will be asked to remove their underwear so PT can examine their perineum. The patient is also given the option of providing their own chaperone as one is not provided in our facility. The patient also has the right and is explained the right to defer or refuse any part of the evaluation or treatment including the internal exam. With the patient's consent, PT will use one gloved finger to gently assess the muscles of the pelvic floor, seeing how well it contracts and relaxes and if there is muscle symmetry. After, the patient will get dressed and PT and patient will discuss exam findings and plan of care. PT and patient discuss plan of care, schedule, attendance policy and HEP activities.  Going through the rectum working on the puborectalis and left levator ani, around the alcock's canal where there was tender spot Self-care: Discussed with patient on using glycerine suppositories to assist with pushing stool out and help with the ribbon shape Discussed with patient on the shape of the colon and the ribbon can come from above and to see his doctor    08/23/23 Manual: Internal pelvic floor techniques: No emotional/communication barriers or cognitive limitation. Patient is motivated to learn. Patient understands and agrees with treatment goals and plan. PT explains patient will be examined in standing, sitting, and lying down to see how their muscles and joints work. When they are ready, they will be asked to remove their underwear so PT can examine their perineum. The patient is also given the option of providing their own chaperone as one is not provided in our facility. The patient also has the right and is explained the right to defer or refuse any part of the evaluation or treatment  including the internal exam. With the patient's consent, PT will use one gloved finger to gently assess the muscles of the pelvic floor, seeing how well it contracts and relaxes and if there is muscle symmetry. After, the patient will get dressed and PT and patient will discuss exam findings and plan of care. PT and patient discuss plan of care, schedule, attendance policy and HEP activities.  Going through the rectum working on the internal and external anal sphincter, along the anterior and posterior rectal area where there is thickness of the tissue.  Self-care: Discussed with patient using the dilators and massaging the rectal canal to improve the tissue mobility and reduce the ribbon like stool                                                                                                                              PATIENT EDUCATION:  03/05/23 Education details: how to relax the pelvic floor, using his dilator for 10 minutes, information on dry needling Person educated: Patient Education method: Explanation, Demonstration, Tactile cues, Verbal cues, and Handouts  Education comprehension: verbalized understanding, returned demonstration, verbal cues required, tactile cues required, and needs further education  HOME EXERCISE PROGRAM: See above.   ASSESSMENT:  CLINICAL IMPRESSION: Patient is a 53 y.o. male who was seen today for physical therapy treatment for constipation. Patient is using the second to last dilator and able to expel it from his anus. He was in the ER and had stool throughout his colon. He is have pain where he had his surgery so it could be restrictions at the site. Today we worked on release tissue in that area, improve mobility of the stool.  Patient will benefit from skilled therapy to improve pelvic floor muscle mobility and reduce constipation.    OBJECTIVE IMPAIRMENTS: decreased coordination, decreased ROM, decreased strength, increased fascial restrictions, increased  muscle spasms, impaired tone, and pain.   ACTIVITY LIMITATIONS: toileting  PARTICIPATION LIMITATIONS: occupation  PERSONAL FACTORS: Time since onset of injury/illness/exacerbation and 1-2 comorbidities: Diverticulitis; IBS; Appendectomy are also affecting patient's functional outcome.   REHAB POTENTIAL: Excellent  CLINICAL DECISION MAKING: Stable/uncomplicated  EVALUATION COMPLEXITY: Low   GOALS: Goals reviewed with patient? Yes  SHORT TERM GOALS: Target date: 04/01/23  Patient understands how to use the anal wand to reduce trigger points in the pelvic floor  Baseline: Goal status: Met 04/21/23  2.  Patient is independent with hip stretches to elongate the pelvic floor.and the hip capsule.  Baseline:  Goal status: Met 04/21/23   LONG TERM GOALS: Target date: 12/24/23  Patient is independent with HEP for flexibility and coordination of the pelvic floor  Baseline:  Goal status: ongoing 08/23/23  2.  Patient stool diameter is the size of nickel or bigger due to elongation of the anal canal and relaxation of the puborectalis.  Baseline:  Goal status: ongoing 08/23/23  3.  Sphincter strength is 4/5 and has the ability to fully relax the puborectalis.  Baseline:  Goal status: Met 05/12/23  4.  Patient has a strong and full urine stream due to the ability to relax his pelvic floor.  Baseline:  Goal status: met 05/26/23  5.  Patient feels he is able to fully empty his rectum when having a bowel movement due to the puborectalis muscle relaxing.  Baseline:  Goal status: ongoing 08/23/23     PLAN:  PT FREQUENCY: d 1 time per month  PT DURATION: other: 6 months  PLANNED INTERVENTIONS: Therapeutic exercises, Therapeutic activity, Neuromuscular re-education, Patient/Family education, Joint mobilization, Dry Needling, Electrical stimulation, Spinal mobilization, Cryotherapy, Moist heat, Ultrasound, Biofeedback, and Manual therapy  PLAN FOR NEXT SESSION: dry needling to the  pelvic floor, manual work to the pelvic floor,  RUSI for the anorectal angle  Marsha Skeen, PT 11/24/23 4:13 PM

## 2023-12-01 ENCOUNTER — Ambulatory Visit: Payer: Commercial Managed Care - PPO | Admitting: Physical Therapy

## 2023-12-01 ENCOUNTER — Encounter: Payer: Self-pay | Admitting: Physical Therapy

## 2023-12-01 DIAGNOSIS — K5902 Outlet dysfunction constipation: Secondary | ICD-10-CM

## 2023-12-01 DIAGNOSIS — R278 Other lack of coordination: Secondary | ICD-10-CM | POA: Diagnosis not present

## 2023-12-01 NOTE — Therapy (Signed)
 OUTPATIENT PHYSICAL THERAPY MALE PELVIC TREATMENT   Patient Name: Jacob Graham MRN: [id number] DOB:1971/01/21, 53 y.o., male Today's Date: 12/01/2023  END OF SESSION:  PT End of Session - 12/01/23 1611     Visit Number 10    Date for PT Re-Evaluation 12/24/23    Authorization Type [employer]    PT Start Time 1615    PT Stop Time 1655    PT Time Calculation (min) 40 min    Activity Tolerance Patient tolerated treatment well    Behavior During Therapy WFL for tasks assessed/performed             Past Medical History:  Diagnosis Date   Asthma    Diverticulitis    IBS (irritable bowel syndrome)    Rectal bleeding    Wears glasses    Past Surgical History:  Procedure Laterality Date   ANAL RECTAL MANOMETRY N/A 07/24/2020   Procedure: ANO RECTAL MANOMETRY;  Surgeon: Sergio Dandy, MD;  Location: [facility];  Service: Endoscopy;  Laterality: N/A;   APPENDECTOMY  1999   colonoscopy  2008, 2014   CYSTOSCOPY WITH RETROGRADE PYELOGRAM, URETEROSCOPY AND STENT PLACEMENT Bilateral 10/25/2019   Procedure: CYSTOSCOPY WITH BILATERAL RETROGRADE, FIREFLY INJECTION  AND URETERAL CATHETER PLACEMENT;  Surgeon: Osborn Blaze, MD;  Location: [facility];  Service: Urology;  Laterality: Bilateral;   EVALUATION UNDER ANESTHESIA WITH FISTULECTOMY N/A 10/09/2015   Procedure: EXAM UNDER ANESTHESIA; FISTULOTOMY;  Surgeon: Joyce Nixon, MD;  Location: [facility] [place];  Service: General;  Laterality: N/A;   KNEE SURGERY  1993   Patient Active Problem List   Diagnosis Date Noted   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    Dyssynergic defecation    Diverticular disease 10/25/2019   Wears glasses    IBS (irritable bowel syndrome)    Rectal bleeding    Diverticulitis    Asthma     PCP: [facility]  REFERRING PROVIDER: Sergio Dandy, MD   REFERRING DIAG: [id number] (ICD-10-CM) - Dyssynergic defecation   THERAPY DIAG:  Other lack of coordination  Dyssynergic  defecation  Rationale for Evaluation and Treatment: Rehabilitation  ONSET DATE: 02/23/23  SUBJECTIVE:                                                                                                                                                                                           SUBJECTIVE STATEMENT: Still not feeling the best. I am having trouble having a bowel movement. I can expel the dilators. I am not able to expel the stool and it is more ribbon form.   PAIN:  Are you having pain?  Yes NPRS scale: 5/10 with inserts; 2/10 with bowel movements Pain location: rectum Pain type: sharp Pain description: intermittent   Aggravating factors: while having a bowel movement and using the dilators Relieving factors: not having a bowel movement  PRECAUTIONS: None  RED FLAGS: None   WEIGHT BEARING RESTRICTIONS: No  FALLS:  Has patient fallen in last 6 months? Yes. Number of falls fell at work with a full back pack and was going too fast but not due to balance  LIVING ENVIRONMENT: Lives with: lives with their spouse  OCCUPATION: sitting  PLOF: Independent  PATIENT GOALS: reduce tension in the anus  PERTINENT HISTORY:  Diverticulitis; IBS; Appendectomy;    BOWEL MOVEMENT: Pain with bowel movement: Yes  Type of bowel movement:Type (Bristol Stool Scale) ribbon short stool, Frequency every 2-3 days, and Strain No, needs to focus on bowel movements to use the correct technique Fully empty rectum: No Leakage: Yes: after a bowel movement Pads: No Fiber supplement: Yes: Miralax  URINATION: Pain with urination: No Fully empty bladder: Yes: times he does not feel like he has to urinate after drinking a lot of water , decreased sensation Stream: has to work at his stream Urgency: No, sit to stand at work will get sensation to have to urinate Frequency: average Leakage: none  INTERCOURSE:no issues    OBJECTIVE:   DIAGNOSTIC FINDINGS:  none   COGNITION: Overall  cognitive status: Within functional limits for tasks assessed     SENSATION: Light touch: Appears intact Proprioception: Appears intact     POSTURE: No Significant postural limitations  PELVIC ALIGNMENT:  LUMBARAROM/PROM:  A/PROM A/PROM  eval  Right rotation Decreased by 25% with pain in right groin   (Blank rows = not tested)  LOWER EXTREMITY AROM/PROM:  P/PROM Right eval Left eval  Hip flexion Full with pain in left groin Full with pain in left groin  Hip abduction 25 25 with pain in groin  Hip internal rotation 10 with pain in groin 20 with pain in groin  Hip external rotation 45 50   (Blank rows = not tested)  LOWER EXTREMITY MMT:  MMT Right eval Left eval  Hip abduction 5/5 4/5   PALPATION:             External Perineal Exam able to pucker the anus              Internal Pelvic Floor the puborectalis is not able to relax, tenderness located in the right side of the puborectalis, tightness in left puborectalis, iliococcygeus and obturator internist Patient confirms identification and approves PT to assess internal pelvic floor and treatment Yes  PELVIC MMT:   MMT eval 05/12/23 08/23/23  Internal Anal Sphincter 3/5 4/5 4/5  External Anal Sphincter 3/5 4/5 4/5  Puborectalis 4/5 4/5 4/5  (Blank rows = not tested)  TONE: increased  TODAY'S TREATMENT:    12/01/23 Manual: Soft tissue mobilization: To assess for dry needling externally Manual work to the perineal body, coccygeus, levator ani to elongate after dry needling Internal pelvic floor techniques: No emotional/communication barriers or cognitive limitation. Patient is motivated to learn. Patient understands and agrees with treatment goals and plan. PT explains patient will be examined in standing, sitting, and lying down to see how their muscles and joints work. When they are ready, they will be asked to remove their underwear so PT can examine their perineum. The patient is also given the option of  providing their own chaperone as one is not provided in our  facility. The patient also has the right and is explained the right to defer or refuse any part of the evaluation or treatment including the internal exam. With the patient's consent, PT will use one gloved finger to gently assess the muscles of the pelvic floor, seeing how well it contracts and relaxes and if there is muscle symmetry. After, the patient will get dressed and PT and patient will discuss exam findings and plan of care. PT and patient discuss plan of care, schedule, attendance policy and HEP activities.  Therapist gloved finger in the rectum working on the iliococcygeus, puborectalis, obturator internist, along the anterior and lateral rectum to elongate and reduce trigger points Dry needling: Trigger Point Dry Needling  Subsequent Treatment: Instructions reviewed, if requested by the patient, prior to subsequent dry needling treatment.   Patient Verbal Consent Given: Yes Education Handout Provided: Previously Provided Muscles Treated: coccygeus, superior transverse perineum, bulbocavernosus, ischiocavernosus; puborectalis Electrical Stimulation Performed: No Treatment Response/Outcome: elongation of muscle and trigger point response Neuromuscular re-education: Pelvic floor contraction training: Therapist finger in the rectum with hip pushing the therapist finger out of the canal    11/24/23 Manual: Soft tissue mobilization: Circular massage to the abdomen to improve peristalic motion Manual work to the diaphragm to increase mobility  Myofascial release: Facial release throughout the abdomen to go through the restrictions to improve movement of stool through the intestines    10/29/23 Manual: Internal pelvic floor techniques: No emotional/communication barriers or cognitive limitation. Patient is motivated to learn. Patient understands and agrees with treatment goals and plan. PT explains patient will be examined in  standing, sitting, and lying down to see how their muscles and joints work. When they are ready, they will be asked to remove their underwear so PT can examine their perineum. The patient is also given the option of providing their own chaperone as one is not provided in our facility. The patient also has the right and is explained the right to defer or refuse any part of the evaluation or treatment including the internal exam. With the patient's consent, PT will use one gloved finger to gently assess the muscles of the pelvic floor, seeing how well it contracts and relaxes and if there is muscle symmetry. After, the patient will get dressed and PT and patient will discuss exam findings and plan of care. PT and patient discuss plan of care, schedule, attendance policy and HEP activities.  Going through the rectum working on the puborectalis and left levator ani, around the alcock's canal where there was tender spot Self-care: Discussed with patient on using glycerine suppositories to assist with pushing stool out and help with the ribbon shape Discussed with patient on the shape of the colon and the ribbon can come from above and to see his doctor  PATIENT EDUCATION:  03/05/23 Education details: how to relax the pelvic floor, using his dilator for 10 minutes, information on dry needling Person educated: Patient Education method: Explanation, Demonstration, Tactile cues, Verbal cues, and Handouts Education comprehension: verbalized understanding, returned demonstration, verbal cues required, tactile cues required, and needs further education  HOME EXERCISE PROGRAM: See above.   ASSESSMENT:  CLINICAL IMPRESSION: Patient is a 53 y.o. male who was seen today for physical therapy treatment for constipation. Patient is able to expel the dilator. He has difficulty with pushing the  ribbon like stool out of the rectum. He feels full and bloated. He was able to push the therapist finger out of the rectum after the manual work. He was able to have a bowel movement after the last visit.   Patient will benefit from skilled therapy to improve pelvic floor muscle mobility and reduce constipation.    OBJECTIVE IMPAIRMENTS: decreased coordination, decreased ROM, decreased strength, increased fascial restrictions, increased muscle spasms, impaired tone, and pain.   ACTIVITY LIMITATIONS: toileting  PARTICIPATION LIMITATIONS: occupation  PERSONAL FACTORS: Time since onset of injury/illness/exacerbation and 1-2 comorbidities: Diverticulitis; IBS; Appendectomy are also affecting patient's functional outcome.   REHAB POTENTIAL: Excellent  CLINICAL DECISION MAKING: Stable/uncomplicated  EVALUATION COMPLEXITY: Low   GOALS: Goals reviewed with patient? Yes  SHORT TERM GOALS: Target date: 04/01/23  Patient understands how to use the anal wand to reduce trigger points in the pelvic floor  Baseline: Goal status: Met 04/21/23  2.  Patient is independent with hip stretches to elongate the pelvic floor.and the hip capsule.  Baseline:  Goal status: Met 04/21/23   LONG TERM GOALS: Target date: 12/24/23  Patient is independent with HEP for flexibility and coordination of the pelvic floor  Baseline:  Goal status: ongoing 12/01/23  2.  Patient stool diameter is the size of nickel or bigger due to elongation of the anal canal and relaxation of the puborectalis.  Baseline:  Goal status: ongoing 12/01/23  3.  Sphincter strength is 4/5 and has the ability to fully relax the puborectalis.  Baseline:  Goal status: Met 05/12/23  4.  Patient has a strong and full urine stream due to the ability to relax his pelvic floor.  Baseline:  Goal status: met 05/26/23  5.  Patient feels he is able to fully empty his rectum when having a bowel movement due to the puborectalis muscle relaxing.   Baseline:  Goal status: ongoing 12/01/23     PLAN:  PT FREQUENCY: d 1 time per month  PT DURATION: other: 6 months  PLANNED INTERVENTIONS: Therapeutic exercises, Therapeutic activity, Neuromuscular re-education, Patient/Family education, Joint mobilization, Dry Needling, Electrical stimulation, Spinal mobilization, Cryotherapy, Moist heat, Ultrasound, Biofeedback, and Manual therapy  PLAN FOR NEXT SESSION: dry needling to the pelvic floor, manual work to the pelvic floor,  See what MD says  Marsha Skeen, PT 12/01/23 4:59 PM

## 2023-12-08 ENCOUNTER — Encounter: Payer: Self-pay | Admitting: Physical Therapy

## 2023-12-09 ENCOUNTER — Ambulatory Visit: Admitting: Gastroenterology

## 2023-12-09 ENCOUNTER — Encounter: Payer: Self-pay | Admitting: Gastroenterology

## 2023-12-09 VITALS — BP 120/78 | HR 67 | Ht 66.0 in | Wt 217.0 lb

## 2023-12-09 DIAGNOSIS — R1032 Left lower quadrant pain: Secondary | ICD-10-CM | POA: Diagnosis not present

## 2023-12-09 DIAGNOSIS — Z9889 Other specified postprocedural states: Secondary | ICD-10-CM

## 2023-12-09 DIAGNOSIS — M6289 Other specified disorders of muscle: Secondary | ICD-10-CM

## 2023-12-09 MED ORDER — NA SULFATE-K SULFATE-MG SULF 17.5-3.13-1.6 GM/177ML PO SOLN: 1.0000 | ORAL | 0 refills | Status: DC

## 2023-12-09 NOTE — Progress Notes (Signed)
 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  He previously was followed by Dr. Leonia Raman.  He had diverticulitis of the left colon in 2021, colonoscopy after that occurred showed diverticulosis in the left colon.  He was referred for surgery and had resection at that time.  He has not had any overt episodes of diverticulitis since then, however has developed some intermittent pains in his left lower quadrant over the past 6 weeks at least.  He initially had some twinges/short-lived episodes of pain in his left lower quadrant.  On May 12 he had a severe episode of pain that radiated into his back along with some bloating and nausea.  He felt his abdomen was distended.  He was concerned for recurrent diverticulitis or a kidney stone went to the ER.  He had a CT scan which showed increased stool burden but no evidence of diverticulitis.  He does have chronic constipation secondary to pelvic floor dysfunction, confirmed by manometry in the past and has been following with pelvic floor PT.  He states the pelvic floor PT team has done a really good job helping him with this and he does reliably have a bowel movement once every few days.  He feels as though once he has a bowel movement he can relieve some of his left lower quadrant discomfort if he does have it.  He states at 1 point of these episodes he felt his abdomen was distended with pain, he made himself n.p.o. other than MiraLAX and states after he moved his bowels a few times he felt much better and felt more decompressed.  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  He also has had a remote anal fissure in the past.  He is not seeing any blood in his stools.  He is not using  Linzess  anymore.  He is more so using MiraLAX as needed and more recently has been using it 1 Daily to help move his bowels.  Overall he is pretty happy with his bowel functioning with pelvic floor PT and MiraLAX, his main concern is the intermittent episodes of left lower quadrant pain with distention.  His last colonoscopy was in 2021.  Of note, in the ED he had a mildly elevated lipase but CBC otherwise looked okay as did AST and ALT.  He states he had repeat lipase level at his PCPs office which was normal.  I have no record of that on file today.   Colonoscopy 08/04/19 - sigmoid diverticulosis, hemorrhoids -  Surgical [P], colon, sigmoid - BENIGN COLONIC MUCOSA WITH LAMINA PROPRIA EDEMA AND FOCAL EXTRAVASATED RED BLOOD CELLS. - NO ACTIVE INFLAMMATION. - NO DYSPLASIA OR MALIGNANCY.  Told to repeat in 10 years  Anorectal manometry - 07/24/20: dyssynergic defecation    CT scan abdomen/ pelvis 11/15/23: IMPRESSION: 1. No acute findings or explanation for the patient's symptoms. Prominent stool throughout the colon, suggesting constipation. 2. Punctate nonobstructing left renal calculus. No evidence of ureteral calculus or hydronephrosis. 3. Stable postsurgical changes from sigmoid colon resection and anastomosis. 4. Lumbar spine details dictated separately. 5.  Aortic Atherosclerosis (ICD10-I70.0).     CT L spine 11/15/23: IMPRESSION: 1. No acute osseous findings or significant disc herniation. 2. Mild multilevel spondylosis. Mild multifactorial spinal stenosis  at L3-4 and L4-5. 3. Intra-abdominal findings are dictated separately.       Past Medical History:  Diagnosis Date   Asthma    Diverticulitis    IBS (irritable bowel syndrome)    Rectal bleeding    Wears glasses      Past Surgical History:  Procedure Laterality Date   ANAL RECTAL MANOMETRY N/A 07/24/2020   Procedure: ANO RECTAL MANOMETRY;  Surgeon: Sergio Dandy, MD;  Location: [facility];  Service:  Endoscopy;  Laterality: N/A;   APPENDECTOMY  1999   colonoscopy  2008, 2014   CYSTOSCOPY WITH RETROGRADE PYELOGRAM, URETEROSCOPY AND STENT PLACEMENT Bilateral 10/25/2019   Procedure: CYSTOSCOPY WITH BILATERAL RETROGRADE, FIREFLY INJECTION  AND URETERAL CATHETER PLACEMENT;  Surgeon: Osborn Blaze, MD;  Location: [facility];  Service: Urology;  Laterality: Bilateral;   EVALUATION UNDER ANESTHESIA WITH FISTULECTOMY N/A 10/09/2015   Procedure: EXAM UNDER ANESTHESIA; FISTULOTOMY;  Surgeon: Joyce Nixon, MD;  Location: [facility];  Service: General;  Laterality: N/A;   KNEE SURGERY  1993   Family History  Problem Relation Age of Onset   Seizures Mother        partial complex    Colon cancer Paternal Grandfather 13   Esophageal cancer Neg Hx    Social History   Tobacco Use   Smoking status: Former    Current packs/day: 0.00    Types: Cigarettes    Quit date: 03/03/2004    Years since quitting: 19.7   Smokeless tobacco: Former  Building services engineer status: Never Used  Substance Use Topics   Alcohol use: Yes    Alcohol/week: 2.0 standard drinks of alcohol    Types: 2 Glasses of wine per week    Comment: occ   Drug use: No   Current Outpatient Medications  Medication Sig Dispense Refill   ascorbic acid (C 500/ROSE HIPS) 500 MG tablet Take 500 mg by mouth daily.     BLACK CURRANT SEED OIL PO Take by mouth.     hydrocortisone  (ANUSOL -HC) 25 MG suppository Place 1 suppository (25 mg total) rectally 2 (two) times daily as needed for hemorrhoids or anal itching. 14 suppository 3   ibuprofen (ADVIL) 200 MG tablet Take 800 mg by mouth every 6 (six) hours as needed for fever, headache or mild pain.     MAGNESIUM  PO Take 1 tablet by mouth daily.     Multiple Vitamin (MULTIVITAMIN WITH MINERALS) TABS tablet Take 1 tablet by mouth daily.     polyethylene glycol (MIRALAX / GLYCOLAX) 17 g packet Take 17 g by mouth 2 (two) times daily as needed for mild constipation.     shark liver  oil-cocoa butter (PREPARATION H) 0.25-3-85.5 % suppository Place 1 suppository rectally as needed for hemorrhoids.     linaclotide  (LINZESS ) 145 MCG CAPS capsule Take 1 capsule (145 mcg total) by mouth daily before breakfast. (Patient not taking: Reported on 12/09/2023) 30 capsule 3   No current facility-administered medications for this visit.   Allergies  Allergen Reactions   Banana Anaphylaxis   Iodinated Contrast Media Other (See Comments)    "Pain in testicles and burning in kidneys"     Review of Systems: All systems reviewed and negative except where noted in HPI.    CT L-SPINE NO CHARGE Result Date: 11/15/2023 CLINICAL DATA:  Abdominal pain which started in the left lower quadrant but now radiates into the low back. Nausea. History of diverticulitis. EXAM: CT Lumbar Spine without contrast TECHNIQUE: Technique:  Multiplanar CT images of the lumbar spine were reconstructed from contemporary CT of the Abdomen and Pelvis. RADIATION DOSE REDUCTION: This exam was performed according to the departmental dose-optimization program which includes automated exposure control, adjustment of the mA and/or kV according to patient size and/or use of iterative reconstruction technique. CONTRAST:  No additional COMPARISON:  Abdominopelvic CT 07/29/2020 FINDINGS: Segmentation: There are 5 lumbar type vertebral bodies. Alignment: Normal. Vertebrae: No evidence of acute fracture or pars defect. Stable bone island in the right aspect of the L1 vertebral body. Paraspinal and other soft tissues: No acute or significant paraspinal findings. Intra-abdominal findings are dictated separately. Disc levels: Preserved disc height at each level. No significant disc herniation or foraminal narrowing is demonstrated. Mild-to-moderate multilevel facet hypertrophy contributes to mild multifactorial spinal stenosis at L3-4 and L4-5. IMPRESSION: 1. No acute osseous findings or significant disc herniation. 2. Mild multilevel  spondylosis. Mild multifactorial spinal stenosis at L3-4 and L4-5. 3. Intra-abdominal findings are dictated separately. Electronically Signed   By: Elmon Hagedorn M.D.   On: 11/15/2023 17:12   CT ABDOMEN PELVIS WO CONTRAST Result Date: 11/15/2023 CLINICAL DATA:  Abdominal pain, acute, nonlocalized Abdominal pain which started in the left lower quadrant but now radiates into the low back. Nausea. History of diverticulitis. EXAM: CT ABDOMEN AND PELVIS WITHOUT CONTRAST TECHNIQUE: Multidetector CT imaging of the abdomen and pelvis was performed following the standard protocol without IV contrast. RADIATION DOSE REDUCTION: This exam was performed according to the departmental dose-optimization program which includes automated exposure control, adjustment of the mA and/or kV according to patient size and/or use of iterative reconstruction technique. COMPARISON:  Abdominopelvic CT 07/29/2020 FINDINGS: Lower chest: Clear lung bases. No significant pleural or pericardial effusion. Hepatobiliary: The liver appears unremarkable as imaged in the noncontrast state. No focal abnormalities are identified. No evidence of gallstones, gallbladder wall thickening or biliary dilatation. Pancreas: Unremarkable. No pancreatic ductal dilatation or surrounding inflammatory changes. Spleen: Normal in size without focal abnormality. Adrenals/Urinary Tract: Both adrenal glands appear normal. Punctate nonobstructing calculus in the upper pole of the left kidney. No evidence of ureteral calculus, hydronephrosis or perinephric soft tissue stranding. The bladder appears unremarkable for its degree of distention. Stomach/Bowel: No enteric contrast administered. The stomach appears unremarkable for its degree of distension. No evidence of bowel wall thickening, distention or surrounding inflammatory change. The appendix is not visualized and may be surgically absent. There is no pericecal inflammation. Stable postsurgical changes from sigmoid  colon resection and anastomosis. Prominent stool again noted throughout the colon. Vascular/Lymphatic: There are no enlarged abdominal or pelvic lymph nodes. Minimal aortoiliac atherosclerosis. No evidence of aneurysm. Reproductive: The prostate gland and seminal vesicles appear unchanged. Other: No evidence of abdominal wall mass or hernia. No ascites or pneumoperitoneum. Musculoskeletal: No acute or significant osseous findings. Lumbar spine details dictated separately. IMPRESSION: 1. No acute findings or explanation for the patient's symptoms. Prominent stool throughout the colon, suggesting constipation. 2. Punctate nonobstructing left renal calculus. No evidence of ureteral calculus or hydronephrosis. 3. Stable postsurgical changes from sigmoid colon resection and anastomosis. 4. Lumbar spine details dictated separately. 5.  Aortic Atherosclerosis (ICD10-I70.0). Electronically Signed   By: Elmon Hagedorn M.D.   On: 11/15/2023 17:07    Physical Exam: BP 120/78   Pulse 67   Ht 5\' 6"  (1.676 m)   Wt 217 lb (98.4 kg)   BMI 35.02 kg/m  Constitutional: Pleasant,well-developed, male in no acute distress. Abdominal: Soft, nondistended, nontender. There are no masses palpable. No hepatomegaly. Neurological:  Alert and oriented to person place and time. Skin: Skin is warm and dry. No rashes noted. Psychiatric: Normal mood and affect. Behavior is normal.   ASSESSMENT: 53 y.o. male here for assessment of the following  1. LLQ pain   2. Pelvic floor dysfunction   3. History of colon surgery    History of diverticulitis status post a history of colon resection in 2021, now with some recurrent left-sided abdominal pain for the past 6 weeks.  Intermittent mild episodes that culminated in 1 severe episode leading him to the ED on May 12.  He had CT scan imaging showing no recurrent diverticulitis and no obstruction but he did have increased stool burden which was a bit surprising to him given his bowels  have been moving fairly regularly.  We discussed DDx.  His episodes of abdominal distention with pain raise possibility of anastomotic stricture, while bowel spasm also possible.  I reassured him no evidence of recurrent diverticulitis based on imaging.  Discussed options, I think a colonoscopy is reasonable since his last exam was preoperatively and to assess for anastomotic stricture.  I discussed risks and benefits of colonoscopy and anesthesia with him and he wants to proceed.  In the interim, recommend he take MiraLAX daily, continue seeing pelvic floor PT for his bowel habits.  I also recommend IBgard as needed for recurrent pain to see if that will help treat component of spasm.  Will reach out to his primary care's office to get results of those labs if available.   PLAN: - colonoscopy at the [facility] - regular prep + half Miralax prep - continue Miralax daily, titrate up as needed - continue pelvic floor PT - IB gard PRN for recurrent pain - labs from PCP - Dickey Fought PA - [facility]  He agrees with the plan, all questions answered.Christi Coward, MD [facility]

## 2023-12-09 NOTE — Patient Instructions (Signed)
 We have sent the following medications to your pharmacy for you to pick up at your convenience: Suprep   You have been scheduled for an endoscopy and colonoscopy. Please follow the written instructions given to you at your visit today.  If you use inhalers (even only as needed), please bring them with you on the day of your procedure.  DO NOT TAKE 7 DAYS PRIOR TO TEST- Trulicity (dulaglutide) Ozempic, Wegovy (semaglutide) Mounjaro (tirzepatide) Bydureon Bcise (exanatide extended release)  DO NOT TAKE 1 DAY PRIOR TO YOUR TEST Rybelsus (semaglutide) Adlyxin (lixisenatide) Victoza (liraglutide) Byetta (exanatide) ___________________________________________________________________________  Continue Miralax daily.   Please purchase the following medications over the counter and take as directed: Ibgard - use as needed   Due to recent changes in healthcare laws, you may see the results of your imaging and laboratory studies on MyChart before your provider has had a chance to review them.  We understand that in some cases there may be results that are confusing or concerning to you. Not all laboratory results come back in the same time frame and the provider may be waiting for multiple results in order to interpret others.  Please give us  48 hours in order for your provider to thoroughly review all the results before contacting the office for clarification of your results.   Thank you for choosing me and [facility].  Dr. Alvester Johnson

## 2023-12-22 ENCOUNTER — Encounter: Payer: Self-pay | Admitting: Physical Therapy

## 2023-12-22 ENCOUNTER — Ambulatory Visit: Payer: Commercial Managed Care - PPO | Attending: Gastroenterology | Admitting: Physical Therapy

## 2023-12-22 DIAGNOSIS — R278 Other lack of coordination: Secondary | ICD-10-CM | POA: Insufficient documentation

## 2023-12-22 DIAGNOSIS — K5902 Outlet dysfunction constipation: Secondary | ICD-10-CM | POA: Diagnosis present

## 2023-12-22 NOTE — Therapy (Signed)
 OUTPATIENT PHYSICAL THERAPY MALE PELVIC TREATMENT   Patient Name: Jacob Graham MRN: [id number] DOB:[birth year], 53 y.o., male Today's Date: 12/22/2023  END OF SESSION:  PT End of Session - 12/22/23 1619     Visit Number 11    Date for PT Re-Evaluation 12/24/23    Authorization Type [employer]    PT Start Time 1615    PT Stop Time 1648    PT Time Calculation (min) 33 min    Activity Tolerance Patient tolerated treatment well    Behavior During Therapy WFL for tasks assessed/performed          Past Medical History:  Diagnosis Date   Asthma    Diverticulitis    IBS (irritable bowel syndrome)    Rectal bleeding    Wears glasses    Past Surgical History:  Procedure Laterality Date   ANAL RECTAL MANOMETRY N/A 07/24/2020   Procedure: ANO RECTAL MANOMETRY;  Surgeon: Sergio Dandy, MD;  Location: [facility];  Service: Endoscopy;  Laterality: N/A;   APPENDECTOMY  1999   colonoscopy  2008, 2014   CYSTOSCOPY WITH RETROGRADE PYELOGRAM, URETEROSCOPY AND STENT PLACEMENT Bilateral 10/25/2019   Procedure: CYSTOSCOPY WITH BILATERAL RETROGRADE, FIREFLY INJECTION  AND URETERAL CATHETER PLACEMENT;  Surgeon: Osborn Blaze, MD;  Location: [facility];  Service: Urology;  Laterality: Bilateral;   EVALUATION UNDER ANESTHESIA WITH FISTULECTOMY N/A 10/09/2015   Procedure: EXAM UNDER ANESTHESIA; FISTULOTOMY;  Surgeon: Joyce Nixon, MD;  Location: [facility];  Service: General;  Laterality: N/A;   KNEE SURGERY  1993   Patient Active Problem List   Diagnosis Date Noted   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    Dyssynergic defecation    Diverticular disease 10/25/2019   Wears glasses    IBS (irritable bowel syndrome)    Rectal bleeding    Diverticulitis    Asthma     PCP: [facility]  REFERRING PROVIDER: Sergio Dandy, MD   REFERRING DIAG: [id number] (ICD-10-CM) - Dyssynergic defecation   THERAPY DIAG:  Other lack of coordination  Dyssynergic  defecation  Rationale for Evaluation and Treatment: Rehabilitation  ONSET DATE: 02/23/23  SUBJECTIVE:                                                                                                                                                                                           SUBJECTIVE STATEMENT: I have to have a colonoscopy and endoscopy. Stress contributes to my issues. I am doing the miralax everyday now. Patient does not get the pinch in left groin anymore.   PAIN:  Are you having pain? Yes NPRS scale: 5/10 with  inserts; 2/10 with bowel movements 12/22/23: 0/10 for rectum, left lower abdominal area  3/10 Pain location: rectum Pain type: sharp Pain description: intermittent   Aggravating factors: while having a bowel movement and using the dilators Relieving factors: not having a bowel movement  PRECAUTIONS: None  RED FLAGS: None   WEIGHT BEARING RESTRICTIONS: No  FALLS:  Has patient fallen in last 6 months? Yes. Number of falls fell at work with a full back pack and was going too fast but not due to balance  LIVING ENVIRONMENT: Lives with: lives with their spouse  OCCUPATION: sitting  PLOF: Independent  PATIENT GOALS: reduce tension in the anus  PERTINENT HISTORY:  Diverticulitis; IBS; Appendectomy;    BOWEL MOVEMENT: Pain with bowel movement: Yes  Type of bowel movement:Type (Bristol Stool Scale) ribbon short stool, Frequency every 2-3 days, and Strain No, needs to focus on bowel movements to use the correct technique Fully empty rectum: No Leakage: Yes: after a bowel movement Pads: No Fiber supplement: Yes: Miralax  URINATION: Pain with urination: No Fully empty bladder: Yes: times he does not feel like he has to urinate after drinking a lot of water , decreased sensation Stream: has to work at his stream Urgency: No, sit to stand at work will get sensation to have to urinate Frequency: average Leakage: none  INTERCOURSE:no  issues    OBJECTIVE:   DIAGNOSTIC FINDINGS:  none   COGNITION: Overall cognitive status: Within functional limits for tasks assessed     SENSATION: Light touch: Appears intact Proprioception: Appears intact     POSTURE: No Significant postural limitations  PELVIC ALIGNMENT:  LUMBARAROM/PROM:  A/PROM A/PROM  eval A/PROM  12/22/23  Right rotation Decreased by 25% with pain in right groin full   (Blank rows = not tested)  LOWER EXTREMITY AROM/PROM:  P/PROM Right eval Left eval  Hip flexion Full with pain in left groin Full with pain in left groin  Hip abduction 25 25 with pain in groin  Hip internal rotation 10 with pain in groin 20 with pain in groin  Hip external rotation 45 50   (Blank rows = not tested)  LOWER EXTREMITY MMT:  MMT Right eval Left eval left 12/22/23  Hip abduction 5/5 4/5 5/5   PALPATION:             External Perineal Exam able to pucker the anus              Internal Pelvic Floor the puborectalis is not able to relax, tenderness located in the right side of the puborectalis, tightness in left puborectalis, iliococcygeus and obturator internist Patient confirms identification and approves PT to assess internal pelvic floor and treatment Yes  PELVIC MMT:   MMT eval 05/12/23 08/23/23  Internal Anal Sphincter 3/5 4/5 4/5  External Anal Sphincter 3/5 4/5 4/5  Puborectalis 4/5 4/5 4/5  (Blank rows = not tested)  TONE: increased  TODAY'S TREATMENT:    12/22/23 Manual: Myofascial release: Fascial release of the left lower quadrant to release the tissue and reduce the pain. He had some tightness that was reduced. Educated him on how to have his wife perform at home.  Self-care: Discussed with patient on different bike seats that would reduce the perineal area from going to sleep.     12/01/23 Manual: Soft tissue mobilization: To assess for dry needling externally Manual work to the perineal body, coccygeus, levator ani to elongate  after dry needling Internal pelvic floor techniques: No emotional/communication barriers or cognitive  limitation. Patient is motivated to learn. Patient understands and agrees with treatment goals and plan. PT explains patient will be examined in standing, sitting, and lying down to see how their muscles and joints work. When they are ready, they will be asked to remove their underwear so PT can examine their perineum. The patient is also given the option of providing their own chaperone as one is not provided in our facility. The patient also has the right and is explained the right to defer or refuse any part of the evaluation or treatment including the internal exam. With the patient's consent, PT will use one gloved finger to gently assess the muscles of the pelvic floor, seeing how well it contracts and relaxes and if there is muscle symmetry. After, the patient will get dressed and PT and patient will discuss exam findings and plan of care. PT and patient discuss plan of care, schedule, attendance policy and HEP activities.  Therapist gloved finger in the rectum working on the iliococcygeus, puborectalis, obturator internist, along the anterior and lateral rectum to elongate and reduce trigger points Dry needling: Trigger Point Dry Needling  Subsequent Treatment: Instructions reviewed, if requested by the patient, prior to subsequent dry needling treatment.   Patient Verbal Consent Given: Yes Education Handout Provided: Previously Provided Muscles Treated: coccygeus, superior transverse perineum, bulbocavernosus, ischiocavernosus; puborectalis Electrical Stimulation Performed: No Treatment Response/Outcome: elongation of muscle and trigger point response Neuromuscular re-education: Pelvic floor contraction training: Therapist finger in the rectum with hip pushing the therapist finger out of the canal    11/24/23 Manual: Soft tissue mobilization: Circular massage to the abdomen to improve  peristalic motion Manual work to the diaphragm to increase mobility  Myofascial release: Facial release throughout the abdomen to go through the restrictions to improve movement of stool through the intestines    10/29/23 Manual: Internal pelvic floor techniques: No emotional/communication barriers or cognitive limitation. Patient is motivated to learn. Patient understands and agrees with treatment goals and plan. PT explains patient will be examined in standing, sitting, and lying down to see how their muscles and joints work. When they are ready, they will be asked to remove their underwear so PT can examine their perineum. The patient is also given the option of providing their own chaperone as one is not provided in our facility. The patient also has the right and is explained the right to defer or refuse any part of the evaluation or treatment including the internal exam. With the patient's consent, PT will use one gloved finger to gently assess the muscles of the pelvic floor, seeing how well it contracts and relaxes and if there is muscle symmetry. After, the patient will get dressed and PT and patient will discuss exam findings and plan of care. PT and patient discuss plan of care, schedule, attendance policy and HEP activities.  Going through the rectum working on the puborectalis and left levator ani, around the alcock's canal where there was tender spot Self-care: Discussed with patient on using glycerine suppositories to assist with pushing stool out and help with the ribbon shape Discussed with patient on the shape of the colon and the ribbon can come from above and to see his doctor  PATIENT EDUCATION:  03/05/23 Education details: how to relax the pelvic floor, using his dilator for 10 minutes, information on dry needling Person educated: Patient Education  method: Explanation, Demonstration, Tactile cues, Verbal cues, and Handouts Education comprehension: verbalized understanding, returned demonstration, verbal cues required, tactile cues required, and needs further education  HOME EXERCISE PROGRAM: See above.   ASSESSMENT:  CLINICAL IMPRESSION: Patient is a 53 y.o. male who was seen today for physical therapy treatment for constipation. Patient is able to expel the dilator. He has difficulty with pushing the ribbon like stool out of the rectum. He feels full and bloated. He will be having a colonoscopy to determine if there is a stricture in the descending colon.   OBJECTIVE IMPAIRMENTS: decreased coordination, decreased ROM, decreased strength, increased fascial restrictions, increased muscle spasms, impaired tone, and pain.   ACTIVITY LIMITATIONS: toileting  PARTICIPATION LIMITATIONS: occupation  PERSONAL FACTORS: Time since onset of injury/illness/exacerbation and 1-2 comorbidities: Diverticulitis; IBS; Appendectomy are also affecting patient's functional outcome.   REHAB POTENTIAL: Excellent  CLINICAL DECISION MAKING: Stable/uncomplicated  EVALUATION COMPLEXITY: Low   GOALS: Goals reviewed with patient? Yes  SHORT TERM GOALS: Target date: 04/01/23  Patient understands how to use the anal wand to reduce trigger points in the pelvic floor  Baseline: Goal status: Met 04/21/23  2.  Patient is independent with hip stretches to elongate the pelvic floor.and the hip capsule.  Baseline:  Goal status: Met 04/21/23   LONG TERM GOALS: Target date: 12/24/23  Patient is independent with HEP for flexibility and coordination of the pelvic floor  Baseline:  Goal status: Met 12/22/23  2.  Patient stool diameter is the size of nickel or bigger due to elongation of the anal canal and relaxation of the puborectalis.  Baseline:  Goal status: not met 12/22/23  3.  Sphincter strength is 4/5 and has the ability to fully relax the  puborectalis.  Baseline:  Goal status: Met 05/12/23  4.  Patient has a strong and full urine stream due to the ability to relax his pelvic floor.  Baseline:  Goal status: met 05/26/23  5.  Patient feels he is able to fully empty his rectum when having a bowel movement due to the puborectalis muscle relaxing.  Baseline:  Goal status: not met 12/22/23     PLAN:Discharge to HEP this visit.    Marsha Skeen, PT 12/22/23 4:56 PM  PHYSICAL THERAPY DISCHARGE SUMMARY  Visits from Start of Care: 11  Current functional level related to goals / functional outcomes: See above.    Remaining deficits: See above. Patient still has pain in the left quadrant and stool is pencil thin. His doctor will be doing a colonoscopy to see if there is a stricture in the end of the descending colon.    Education / Equipment: HEP   Patient agrees to discharge. Patient goals were partially met. Patient is being discharged due to being pleased with the current functional level. Thank you for the referral.   Marsha Skeen, PT 12/22/23 4:56 PM

## 2023-12-23 ENCOUNTER — Encounter: Payer: Self-pay | Admitting: Physician Assistant

## 2023-12-23 ENCOUNTER — Ambulatory Visit: Admitting: Gastroenterology

## 2024-01-21 ENCOUNTER — Encounter: Payer: Self-pay | Admitting: Gastroenterology

## 2024-01-28 ENCOUNTER — Ambulatory Visit: Admitting: Gastroenterology

## 2024-01-28 ENCOUNTER — Encounter: Payer: Self-pay | Admitting: Gastroenterology

## 2024-01-28 VITALS — BP 136/91 | HR 70 | Temp 98.1°F | Resp 12 | Ht 66.0 in | Wt 217.0 lb

## 2024-01-28 DIAGNOSIS — K6389 Other specified diseases of intestine: Secondary | ICD-10-CM

## 2024-01-28 DIAGNOSIS — D123 Benign neoplasm of transverse colon: Secondary | ICD-10-CM

## 2024-01-28 DIAGNOSIS — R1032 Left lower quadrant pain: Secondary | ICD-10-CM

## 2024-01-28 DIAGNOSIS — K648 Other hemorrhoids: Secondary | ICD-10-CM | POA: Diagnosis not present

## 2024-01-28 DIAGNOSIS — K529 Noninfective gastroenteritis and colitis, unspecified: Secondary | ICD-10-CM | POA: Diagnosis present

## 2024-01-28 DIAGNOSIS — Z9889 Other specified postprocedural states: Secondary | ICD-10-CM

## 2024-01-28 MED ORDER — SODIUM CHLORIDE 0.9 % IV SOLN: 500.0000 mL | INTRAVENOUS | Status: DC

## 2024-01-28 NOTE — Patient Instructions (Addendum)
Handout on polyps provided.  Await pathology results.  YOU HAD AN ENDOSCOPIC PROCEDURE TODAY AT [facility]:   Refer to the procedure report that was given to you for any specific questions about what was found during the examination.  If the procedure report does not answer your questions, please call your gastroenterologist to clarify.  If you requested that your care partner not be given the details of your procedure findings, then the procedure report has been included in a sealed envelope for you to review at your convenience later.  YOU SHOULD EXPECT: Some feelings of bloating in the abdomen. Passage of more gas than usual.  Walking can help get rid of the air that was put into your GI tract during the procedure and reduce the bloating. If you had a lower endoscopy (such as a colonoscopy or flexible sigmoidoscopy) you may notice spotting of blood in your stool or on the toilet paper. If you underwent a bowel prep for your procedure, you may not have a normal bowel movement for a few days.  Please Note:  You might notice some irritation and congestion in your nose or some drainage.  This is from the oxygen used during your procedure.  There is no need for concern and it should clear up in a day or so.  SYMPTOMS TO REPORT IMMEDIATELY:  Following lower endoscopy (colonoscopy or flexible sigmoidoscopy):  Excessive amounts of blood in the stool  Significant tenderness or worsening of abdominal pains  Swelling of the abdomen that is new, acute  Fever of 100F or higher   For urgent or emergent issues, a gastroenterologist can be reached at any hour by calling [phone]. Do not use MyChart messaging for urgent concerns.    DIET:  We do recommend a small meal at first, but then you may proceed to your regular diet.  Drink plenty of fluids but you should avoid alcoholic beverages for 24 hours.  ACTIVITY:  You should plan to take it easy for the rest of today and you should  NOT DRIVE or use heavy machinery until tomorrow (because of the sedation medicines used during the test).    FOLLOW UP: Our staff will call the number listed on your records the next business day following your procedure.  We will call around 7:15- 8:00 am to check on you and address any questions or concerns that you may have regarding the information given to you following your procedure. If we do not reach you, we will leave a message.     If any biopsies were taken you will be contacted by phone or by letter within the next 1-3 weeks.  Please call us at [phone] if you have not heard about the biopsies in 3 weeks.    SIGNATURES/CONFIDENTIALITY: You and/or your care partner have signed paperwork which will be entered into your electronic medical record.  These signatures attest to the fact that that the information above on your After Visit Summary has been reviewed and is understood.  Full responsibility of the confidentiality of this discharge information lies with you and/or your care-partner.

## 2024-01-28 NOTE — Progress Notes (Signed)
 Called to room to assist during endoscopic procedure.  Patient ID and intended procedure confirmed with present staff. Received instructions for my participation in the procedure from the performing physician.

## 2024-01-28 NOTE — Progress Notes (Signed)
 Pt sedate, gd SR's, VSS, report to RN

## 2024-01-28 NOTE — Progress Notes (Signed)
 [facility] History and Physical   Primary Care Physician:  Sheldon Netter, PA   Reason for Procedure:   LLQ pain, history of colon surgery  Plan:    colonoscopy     HPI: Jacob Graham is a 53 y.o. male  here for colonoscopy - he has been having intermittent episodes of LLQ pain with distension. History of diverticulitis s/p colon resection in 2021. Treated with Miralax and IB gard recently, CT showed no recurrent diverticulitis. Colonoscopy to further evaluate, ensure no anastomotic stricture.    Otherwise feels well without any cardiopulmonary symptoms.   I have discussed risks / benefits of anesthesia and endoscopic procedure with Neythan F Eberlin and they wish to proceed with the exams as outlined today.    Past Medical History:  Diagnosis Date   Asthma    Diverticulitis    IBS (irritable bowel syndrome)    Rectal bleeding    Wears glasses     Past Surgical History:  Procedure Laterality Date   ANAL RECTAL MANOMETRY N/A 07/24/2020   Procedure: ANO RECTAL MANOMETRY;  Surgeon: Shila Gustav GAILS, MD;  Location: [facility];  Service: Endoscopy;  Laterality: N/A;   APPENDECTOMY  07/06/1997   colonoscopy  2008, 2014   COLONOSCOPY     CYSTOSCOPY WITH RETROGRADE PYELOGRAM, URETEROSCOPY AND STENT PLACEMENT Bilateral 10/25/2019   Procedure: CYSTOSCOPY WITH BILATERAL RETROGRADE, FIREFLY INJECTION  AND URETERAL CATHETER PLACEMENT;  Surgeon: Alvaro Hummer, MD;  Location: [facility];  Service: Urology;  Laterality: Bilateral;   EVALUATION UNDER ANESTHESIA WITH FISTULECTOMY N/A 10/09/2015   Procedure: EXAM UNDER ANESTHESIA; FISTULOTOMY;  Surgeon: Bernarda Ned, MD;  [facility]: [facility];  Service: General;  Laterality: N/A;   KNEE SURGERY  07/07/1991    Prior to Admission medications   Medication Sig Start Date End Date Taking? Authorizing Provider  ascorbic acid (C 500/ROSE HIPS) 500 MG tablet Take 500 mg by mouth daily.   Yes [provider]  polyethylene glycol (MIRALAX / GLYCOLAX) 17 g packet Take 17 g by mouth 2 (two) times daily as needed for mild constipation.   Yes [provider]  BLACK CURRANT SEED OIL PO Take by mouth.    [provider]  hydrocortisone  (ANUSOL -HC) 25 MG suppository Place 1 suppository (25 mg total) rectally 2 (two) times daily as needed for hemorrhoids or anal itching. 03/18/22   Nandigam, Kavitha V, MD  ibuprofen (ADVIL) 200 MG tablet Take 800 mg by mouth every 6 (six) hours as needed for fever, headache or mild pain.    [provider]  MAGNESIUM  PO Take 1 tablet by mouth daily.    [provider]  Multiple Vitamin (MULTIVITAMIN WITH MINERALS) TABS tablet Take 1 tablet by mouth daily.    [provider]  shark liver oil-cocoa butter (PREPARATION H) 0.25-3-85.5 % suppository Place 1 suppository rectally as needed for hemorrhoids.    [provider]    Current Outpatient Medications  Medication Sig Dispense Refill   ascorbic acid (C 500/ROSE HIPS) 500 MG tablet Take 500 mg by mouth daily.     polyethylene glycol (MIRALAX / GLYCOLAX) 17 g packet Take 17 g by mouth 2 (two) times daily as needed for mild constipation.     BLACK CURRANT SEED OIL PO Take by mouth.     hydrocortisone  (ANUSOL -HC) 25 MG suppository Place 1 suppository (25 mg total) rectally 2 (two) times daily as needed for hemorrhoids or anal itching. 14 suppository 3   ibuprofen (ADVIL) 200  MG tablet Take 800 mg by mouth every 6 (six) hours as needed for fever, headache or mild pain.     MAGNESIUM  PO Take 1 tablet by mouth daily.     Multiple Vitamin (MULTIVITAMIN WITH MINERALS) TABS tablet Take 1 tablet by mouth daily.     shark liver oil-cocoa butter (PREPARATION H) 0.25-3-85.5 % suppository Place 1 suppository rectally as needed for hemorrhoids.     Current Facility-Administered Medications  Medication Dose Route Frequency Provider Last Rate Last Admin   0.9 %  sodium  chloride infusion  500 mL Intravenous Continuous Makhya Arave, Elspeth SQUIBB, MD        Allergies as of 01/28/2024 - Review Complete 01/28/2024  Allergen Reaction Noted   Banana Anaphylaxis 10/08/2015   Iodinated contrast media Other (See Comments) 06/15/2019    Family History  Problem Relation Age of Onset   Seizures Mother        partial complex    Colon cancer Paternal Grandfather 43   Esophageal cancer Neg Hx    Stomach cancer Neg Hx    Rectal cancer Neg Hx     Social History   Socioeconomic History   Marital status: Married    Spouse name: Not on file   Number of children: 0   Years of education: Not on file   Highest education level: Not on file  Occupational History   Not on file  Tobacco Use   Smoking status: Former    Current packs/day: 0.00    Types: Cigarettes    Quit date: 03/03/2004    Years since quitting: 19.9   Smokeless tobacco: Former  Building services engineer status: Never Used  Substance and Sexual Activity   Alcohol use: Yes    Alcohol/week: 2.0 standard drinks of alcohol    Types: 2 Glasses of wine per week    Comment: occ   Drug use: No   Sexual activity: Not on file  Other Topics Concern   Not on file  Social History Narrative   Not on file   Social Drivers of Health   Financial Resource Strain: Not on file  Food Insecurity: Not on file  Transportation Needs: Not on file  Physical Activity: Not on file  Stress: Not on file  Social Connections: Not on file  Intimate Partner Violence: Unknown (10/07/2021)   Received from [facility]   HITS    Physically Hurt: Not on file    Insult or Talk Down To: Not on file    Threaten Physical Harm: Not on file    Scream or Curse: Not on file    Review of Systems: All other review of systems negative except as mentioned in the HPI.  Physical Exam: Vital signs BP 122/82   Pulse 70   Temp 98.1 F (36.7 C) (Temporal)   Ht 5' 6 (1.676 m)   Wt 217 lb (98.4 kg)   SpO2 97%   BMI 35.02 kg/m    General:   Alert,  Well-developed, pleasant and cooperative in NAD Lungs:  Clear throughout to auscultation.   Heart:  Regular rate and rhythm Abdomen:  Soft, nontender and nondistended.   Neuro/Psych:  Alert and cooperative. Normal mood and affect. A and O x 3  Marcey Naval, MD [facility]

## 2024-01-28 NOTE — Op Note (Signed)
 [name] Patient Name: Jacob Graham Procedure Date: 01/28/2024 2:06 PM MRN: [id number] Endoscopist: Elspeth P. Leigh , MD, [id number] Age: 53 Referring MD:  Date of Birth: [birth year] Gender: Male Account #: [id number] Procedure:                Colonoscopy Indications:              Episodic abdominal pain in the left lower quadrant                            with distension, follow-up of diverticulitis                            surgery - sigmoid resection, rule out anastomotic                            stricture - improved on Miralax since last visit Medicines:                Monitored Anesthesia Care Procedure:                Pre-Anesthesia Assessment:                           - Prior to the procedure, a History and Physical                            was performed, and patient medications and                            allergies were reviewed. The patient's tolerance of                            previous anesthesia was also reviewed. The risks                            and benefits of the procedure and the sedation                            options and risks were discussed with the patient.                            All questions were answered, and informed consent                            was obtained. Prior Anticoagulants: The patient has                            taken no anticoagulant or antiplatelet agents. ASA                            Grade Assessment: II - A patient with mild systemic                            disease. After reviewing the risks and benefits,  the patient was deemed in satisfactory condition to                            undergo the procedure.                           After obtaining informed consent, the colonoscope                            was passed under direct vision. Throughout the                            procedure, the patient's blood pressure, pulse, and                            oxygen  saturations were monitored continuously. The                            CF [id number] #[id number] was introduced through the anus                            and advanced to the the terminal ileum, with                            identification of the appendiceal orifice and IC                            valve. The colonoscopy was performed without                            difficulty. The patient tolerated the procedure                            well. The quality of the bowel preparation was                            adequate. The terminal ileum, ileocecal valve,                            appendiceal orifice, and rectum were photographed. Scope In: 2:31:09 PM Scope Out: 2:47:19 PM Scope Withdrawal Time: 0 hours 13 minutes 22 seconds  Total Procedure Duration: 0 hours 16 minutes 10 seconds  Findings:                 The perianal and digital rectal examinations were                            normal.                           The terminal ileum contained a few patchy erosions.                            Biopsies were taken with a cold forceps for  histology.                           A diminutive polyp was found in the transverse                            colon. The polyp was sessile. The polyp was removed                            with a cold biopsy forceps. Resection and retrieval                            were complete.                           There was evidence of a prior end-to-end                            colo-colonic anastomosis in the recto-sigmoid                            colon. This was widely patent and was characterized                            by healthy appearing mucosa. No stenosis /                            stricture appreciated.                           Internal hemorrhoids were found during retroflexion.                           The exam was otherwise without abnormality. Complications:            No immediate complications. Estimated  blood loss:                            Minimal. Estimated Blood Loss:     Estimated blood loss was minimal. Impression:               - A few erosions in the terminal ileum. Biopsied.                            Nonspecific - could represent changes due to NSAIDs                            if taken routinely/                           - One diminutive polyp in the transverse colon,                            removed with a cold biopsy forceps. Resected and  retrieved.                           - Patent end-to-end colo-colonic anastomosis,                            characterized by healthy appearing mucosa, widely                            patent.                           - Internal hemorrhoids.                           - The examination was otherwise normal. Recommendation:           - Patient has a contact number available for                            emergencies. The signs and symptoms of potential                            delayed complications were discussed with the                            patient. Return to normal activities tomorrow.                            Written discharge instructions were provided to the                            patient.                           - Resume previous diet.                           - Continue present medications.                           - Continue Miralax daily - prevent constipation                           - Await pathology results. Elspeth P. Leigh, MD 01/28/2024 2:53:44 PM This report has been signed electronically.

## 2024-01-29 ENCOUNTER — Encounter: Payer: Self-pay | Admitting: Physical Therapy

## 2024-01-31 ENCOUNTER — Telehealth: Payer: Self-pay

## 2024-01-31 NOTE — Telephone Encounter (Signed)
 Left message on answering machine.

## 2024-02-03 ENCOUNTER — Ambulatory Visit: Payer: Self-pay | Admitting: Gastroenterology

## 2024-02-03 LAB — SURGICAL PATHOLOGY

## 2024-04-19 ENCOUNTER — Ambulatory Visit: Admitting: Gastroenterology

## 2024-04-19 ENCOUNTER — Ambulatory Visit: Payer: Self-pay | Admitting: Gastroenterology

## 2024-04-19 ENCOUNTER — Other Ambulatory Visit (INDEPENDENT_AMBULATORY_CARE_PROVIDER_SITE_OTHER)

## 2024-04-19 ENCOUNTER — Encounter: Payer: Self-pay | Admitting: Gastroenterology

## 2024-04-19 VITALS — BP 138/86 | HR 68 | Ht 66.0 in | Wt 223.0 lb

## 2024-04-19 DIAGNOSIS — M9905 Segmental and somatic dysfunction of pelvic region: Secondary | ICD-10-CM | POA: Diagnosis not present

## 2024-04-19 DIAGNOSIS — K529 Noninfective gastroenteritis and colitis, unspecified: Secondary | ICD-10-CM

## 2024-04-19 DIAGNOSIS — K5900 Constipation, unspecified: Secondary | ICD-10-CM

## 2024-04-19 DIAGNOSIS — M6289 Other specified disorders of muscle: Secondary | ICD-10-CM

## 2024-04-19 LAB — VITAMIN B12: Vitamin B-12: 430 pg/mL (ref 211–911)

## 2024-04-19 LAB — CBC WITH DIFFERENTIAL/PLATELET
Basophils Absolute: 0 K/uL (ref 0.0–0.1)
Basophils Relative: 0.5 % (ref 0.0–3.0)
Eosinophils Absolute: 0.2 K/uL (ref 0.0–0.7)
Eosinophils Relative: 3.4 % (ref 0.0–5.0)
HCT: 45.4 % (ref 39.0–52.0)
Hemoglobin: 15.3 g/dL (ref 13.0–17.0)
Lymphocytes Relative: 22.7 % (ref 12.0–46.0)
Lymphs Abs: 1.3 K/uL (ref 0.7–4.0)
MCHC: 33.7 g/dL (ref 30.0–36.0)
MCV: 91.4 fl (ref 78.0–100.0)
Monocytes Absolute: 0.4 K/uL (ref 0.1–1.0)
Monocytes Relative: 7.6 % (ref 3.0–12.0)
Neutro Abs: 3.7 K/uL (ref 1.4–7.7)
Neutrophils Relative %: 65.8 % (ref 43.0–77.0)
Platelets: 222 K/uL (ref 150.0–400.0)
RBC: 4.96 Mil/uL (ref 4.22–5.81)
RDW: 13.6 % (ref 11.5–15.5)
WBC: 5.7 K/uL (ref 4.0–10.5)

## 2024-04-19 LAB — IBC + FERRITIN
Ferritin: 126.6 ng/mL (ref 22.0–322.0)
Iron: 92 ug/dL (ref 42–165)
Saturation Ratios: 23.8 % (ref 20.0–50.0)
TIBC: 386.4 ug/dL (ref 250.0–450.0)
Transferrin: 276 mg/dL (ref 212.0–360.0)

## 2024-04-19 NOTE — Progress Notes (Signed)
 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   Recall he had diverticulitis of the left colon in 2021, colonoscopy after that occurred showed diverticulosis in the left colon.  He was referred for surgery and had resection at that time.  He has not had any overt episodes of diverticulitis since then, however has had some intermittent pains on and off in that area.  He takes MiraLAX to keep his bowels moving along with pelvic floor PT.  He also takes IBgard to use as needed which helps.  He can have some cramping in his abdomen when he is constipated but this is reliably relieved with a bowel movement.  He most recently had a CT scan in May in the setting of pain which showed no recurrent diverticulitis but he was constipated.  He underwent a colonoscopy with me in July.  This showed that his surgical anastomosis was patent and there was no stricture there to be causing his symptoms.  As part of a routine exam ileum was intubated and he had some small erosions noted there.  Biopsies showed chronic enteritis consistent with inflammatory bowel disease.  He had mentioned taking some ibuprofen preceding the exam.  Specifically he did not take anything for at least 1 month before the colonoscopy, he was taking an occasional ibuprofen/Tylenol  pill for aches pains before then but not routinely.  He denies any routine NSAID use.  He denies any routine pains in his abdomen otherwise, specifically in the right lower quadrant.  No diarrhea.  Has occasional rectal bleeding in the setting of constipation he attributes to hemorrhoids.   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  He also has had a remote anal fissure in the past.  No family history of Crohn's disease or colitis.    Colonoscopy  08/04/19 - sigmoid diverticulosis, hemorrhoids -  Surgical [P], colon, sigmoid - BENIGN COLONIC MUCOSA WITH LAMINA PROPRIA EDEMA AND FOCAL EXTRAVASATED RED BLOOD CELLS. - NO ACTIVE INFLAMMATION. - NO DYSPLASIA OR MALIGNANCY.   Told to repeat in 10 years   Anorectal manometry - 07/24/20: dyssynergic defecation   CT scan abdomen/ pelvis 11/15/23: IMPRESSION: 1. No acute findings or explanation for the patient's symptoms. Prominent stool throughout the colon, suggesting constipation. 2. Punctate nonobstructing left renal calculus. No evidence of ureteral calculus or hydronephrosis. 3. Stable postsurgical changes from sigmoid colon resection and anastomosis. 4. Lumbar spine details dictated separately. 5.  Aortic Atherosclerosis (ICD10-I70.0).      Colonoscopy 01/28/24: - The perianal and digital rectal examinations were normal. - The terminal ileum contained a few patchy erosions. Biopsies were taken with a cold forceps for histology. - A diminutive polyp was found in the transverse colon. The polyp was sessile. The polyp was removed with a cold biopsy forceps. Resection and retrieval were complete. - There was evidence of a prior end-to-end colo-colonic anastomosis in the recto-sigmoid colon. This was widely patent and was characterized by healthy appearing mucosa. No stenosis / stricture appreciated. - Internal hemorrhoids were found during retroflexion. - The exam was otherwise without abnormality.    FINAL DIAGNOSIS       1. Surgical [P], small bowel, ileum :      - ACTIVE CHRONIC ENTERITIS WITH PSEUDOPYLORIC GLAND METAPLASIA, CONSISTENT WITH      INFLAMMATORY BOWEL DISEASE.      -  NEGATIVE FOR DYSPLASIA AND MALIGNANCY.       2. Surgical [P], colon, transverse, polyp (1) :      -COLONIC MUCOSA WITH NO SPECIFIC PATHOLOGIC DIAGNOSIS.(MICROSCOPICALLY EXAMINED      3 ADDITIONAL H&E LEVELS).      -NEGATIVE FOR ACTIVE COLITIS AND PATHOLOGIC INTRAEPITHELIAL LYMPHOCYTOSIS.      -NEGATIVE  FOR ADENOMATOUS CHANGE, POLYPOID EPITHELIAL SERRATION AND MALIGNANCY.  Past Medical History:  Diagnosis Date   Asthma    Diverticulitis    IBS (irritable bowel syndrome)    Rectal bleeding    Wears glasses      Past Surgical History:  Procedure Laterality Date   ANAL RECTAL MANOMETRY N/A 07/24/2020   Procedure: ANO RECTAL MANOMETRY;  Surgeon: Shila Gustav GAILS, MD;  Location: [facility];  Service: Endoscopy;  Laterality: N/A;   APPENDECTOMY  07/06/1997   colonoscopy  2008, 2014   COLONOSCOPY     CYSTOSCOPY WITH RETROGRADE PYELOGRAM, URETEROSCOPY AND STENT PLACEMENT Bilateral 10/25/2019   Procedure: CYSTOSCOPY WITH BILATERAL RETROGRADE, FIREFLY INJECTION  AND URETERAL CATHETER PLACEMENT;  Surgeon: Alvaro Hummer, MD;  Location: [facility];  Service: Urology;  Laterality: Bilateral;   EVALUATION UNDER ANESTHESIA WITH FISTULECTOMY N/A 10/09/2015   Procedure: EXAM UNDER ANESTHESIA; FISTULOTOMY;  Surgeon: Bernarda Ned, MD;  Location: [facility];  Service: General;  Laterality: N/A;   KNEE SURGERY  07/07/1991   Family History  Problem Relation Age of Onset   Seizures Mother        partial complex    Colon cancer Paternal Grandfather 30   Esophageal cancer Neg Hx    Stomach cancer Neg Hx    Rectal cancer Neg Hx    Social History   Tobacco Use   Smoking status: Former    Current packs/day: 0.00    Types: Cigarettes    Quit date: 03/03/2004    Years since quitting: 20.1   Smokeless tobacco: Former  Building services engineer status: Never Used  Substance Use Topics   Alcohol use: Yes    Alcohol/week: 2.0 standard drinks of alcohol    Types: 2 Glasses of wine per week    Comment: occ   Drug use: No   Current Outpatient Medications  Medication Sig Dispense Refill   ascorbic acid (C 500/ROSE HIPS) 500 MG tablet Take 500 mg by mouth daily.     hydrocortisone  (ANUSOL -HC) 25 MG suppository Place 1 suppository (25 mg total) rectally 2 (two) times daily as needed for  hemorrhoids or anal itching. 14 suppository 3   ibuprofen (ADVIL) 200 MG tablet Take 800 mg by mouth every 6 (six) hours as needed for fever, headache or mild pain.     MAGNESIUM  PO Take 1 tablet by mouth daily.     Multiple Vitamin (MULTIVITAMIN WITH MINERALS) TABS tablet Take 1 tablet by mouth daily.     polyethylene glycol (MIRALAX / GLYCOLAX) 17 g packet Take 17 g by mouth 2 (two) times daily as needed for mild constipation.     shark liver oil-cocoa butter (PREPARATION H) 0.25-3-85.5 % suppository Place 1 suppository rectally as needed for hemorrhoids.     No current facility-administered medications for this visit.   Allergies  Allergen Reactions   Banana Anaphylaxis   Iodinated Contrast Media Other (See Comments)    Pain in testicles and burning in kidneys     Review of Systems: All systems reviewed and negative except where noted in HPI.   Lab Results  Component Value Date  WBC 7.4 11/15/2023   HGB 15.8 11/15/2023   HCT 46.1 11/15/2023   MCV 91.8 11/15/2023   PLT 253 11/15/2023     Physical Exam: BP 138/86 (BP Location: Left Arm)   Pulse 68   Ht 5' 6 (1.676 m)   Wt 223 lb (101.2 kg)   SpO2 97%   BMI 35.99 kg/m  Constitutional: Pleasant,well-developed, male in no acute distress. Neurological: Alert and oriented to person place and time. Psychiatric: Normal mood and affect. Behavior is normal.   ASSESSMENT: 53 y.o. male here for assessment of the following  1. Ileitis   2. Constipation, unspecified constipation type   3. Pelvic floor dysfunction    As above, colonoscopy in July showed some small erosions in the terminal ileum.  Biopsies consistent with chronic active inflammation, pathologist says consistent with Crohn's disease.  He did take some NSAIDs periodically prior to the exam however that was more than 4 weeks prior to the colonoscopy.  I do not think seldom NSAID use would cause this.  We discussed DDx and I think it is quite possible he may have  mild Crohn's disease of his ileum.  Of note he has had 1 perianal fistula in the past treated operatively and no recurrence since then.  We discussed what Crohn's disease is and spectrum of disease it can cause.  He has never had an obstruction, has no diarrhea.  I think this may be a red herring and not causing his symptoms at this time which I think is more likely due to IBS-C.  I would recommend that we check CBC for anemia, check iron and B12 levels to make sure no vitamin deficiencies, and check fecal calprotectin.  If all of these look okay then I would think it is very unlikely that Crohn's disease is clinically causing any problems right now.  Did note that this has potential to change or flare over time and needs to be mindful of symptoms.  If he has symptoms of concern moving forward would have low threshold to perform MR enterography etc.  Hopefully this is something that needs to be just monitored and not necessarily treated over time.  If it is not causing symptoms or any problems with deficiencies etc., I would recommend monitoring for now.  He should avoid NSAIDs to avoid confounding the picture moving forward.  Otherwise he should continue with MiraLAX for his constipation and IBgard as needed, both of which worked well.  He has seen pelvic floor PT in the past.  Follow-up yearly if not sooner with any issues.  He agrees with the plan.  PLAN: - CBC, TIBC / ferritin, B12 - fecal calprotectin - continue Miralax - continue IB gard PRN  - avoid NSAIDs - follow up yearly if not sooner with issues  Marcey Naval, MD [facility]

## 2024-04-19 NOTE — Patient Instructions (Addendum)
 Please go to the lab in the basement of our building to have lab work done as you leave today. Hit B for basement when you get on the elevator.  When the doors open the lab is on your left.  We will call you with the results. Thank you.   Continue Miralax  Continue IBgard as needed  Please return in 1 year or sooner as needed.  Thank you for entrusting me with your care and for choosing [facility], Dr. Elspeth Naval   _______________________________________________________  If your blood pressure at your visit was 140/90 or greater, please contact your primary care physician to follow up on this.  _______________________________________________________  If you are age 69 or older, your body mass index should be between 23-30. Your Body mass index is 35.99 kg/m. If this is out of the aforementioned range listed, please consider follow up with your Primary Care Provider.  If you are age 48 or younger, your body mass index should be between 19-25. Your Body mass index is 35.99 kg/m. If this is out of the aformentioned range listed, please consider follow up with your Primary Care Provider.   ________________________________________________________  The [facility] providers would like to encourage you to use MYCHART to communicate with providers for non-urgent requests or questions.  Due to long hold times on the telephone, sending your provider a message by [facility] may be a faster and more efficient way to get a response.  Please allow 48 business hours for a response.  Please remember that this is for non-urgent requests.  _______________________________________________________  [facility] is using a team-based approach to care.  Your team is made up of your doctor and two to three APPS. Our APPS (Nurse Practitioners and Physician Assistants) work with your physician to ensure care continuity for you. They are fully qualified to address your health concerns and  develop a treatment plan. They communicate directly with your gastroenterologist to care for you. Seeing the Advanced Practice Practitioners on your physician's team can help you by facilitating care more promptly, often allowing for earlier appointments, access to diagnostic testing, procedures, and other specialty referrals.

## 2024-04-21 ENCOUNTER — Other Ambulatory Visit

## 2024-04-21 DIAGNOSIS — K529 Noninfective gastroenteritis and colitis, unspecified: Secondary | ICD-10-CM

## 2024-04-21 DIAGNOSIS — K5900 Constipation, unspecified: Secondary | ICD-10-CM

## 2024-04-27 LAB — CALPROTECTIN: Calprotectin: 51 ug/g

## 2024-07-13 ENCOUNTER — Other Ambulatory Visit (HOSPITAL_BASED_OUTPATIENT_CLINIC_OR_DEPARTMENT_OTHER): Payer: Self-pay | Admitting: Physician Assistant

## 2024-07-13 DIAGNOSIS — E7800 Pure hypercholesterolemia, unspecified: Secondary | ICD-10-CM

## 2024-07-25 ENCOUNTER — Ambulatory Visit (HOSPITAL_BASED_OUTPATIENT_CLINIC_OR_DEPARTMENT_OTHER)
Admission: RE | Admit: 2024-07-25 | Discharge: 2024-07-25 | Disposition: A | Discharge status: Home / Self Care | Payer: Self-pay | Source: Ambulatory Visit | Attending: Physician Assistant | Admitting: Physician Assistant

## 2024-07-25 DIAGNOSIS — E7800 Pure hypercholesterolemia, unspecified: Secondary | ICD-10-CM | POA: Insufficient documentation
